# Patient Record
Sex: Female | Born: 1950 | Race: White | Hispanic: No | Smoking: Never smoker
Health system: Southern US, Community
[De-identification: ages and names within clinical notes are randomized; demographics above are authoritative.]

## PROBLEM LIST (undated history)

## (undated) DIAGNOSIS — T7840XA Allergy, unspecified, initial encounter: Secondary | ICD-10-CM

## (undated) DIAGNOSIS — G709 Myoneural disorder, unspecified: Secondary | ICD-10-CM

## (undated) DIAGNOSIS — B029 Zoster without complications: Secondary | ICD-10-CM

## (undated) DIAGNOSIS — E785 Hyperlipidemia, unspecified: Secondary | ICD-10-CM

## (undated) DIAGNOSIS — K219 Gastro-esophageal reflux disease without esophagitis: Secondary | ICD-10-CM

## (undated) DIAGNOSIS — F419 Anxiety disorder, unspecified: Secondary | ICD-10-CM

## (undated) DIAGNOSIS — I10 Essential (primary) hypertension: Secondary | ICD-10-CM

## (undated) DIAGNOSIS — M1990 Unspecified osteoarthritis, unspecified site: Secondary | ICD-10-CM

## (undated) DIAGNOSIS — IMO0002 Reserved for concepts with insufficient information to code with codable children: Secondary | ICD-10-CM

## (undated) DIAGNOSIS — M349 Systemic sclerosis, unspecified: Secondary | ICD-10-CM

## (undated) DIAGNOSIS — M810 Age-related osteoporosis without current pathological fracture: Secondary | ICD-10-CM

## (undated) HISTORY — DX: Essential (primary) hypertension: I10

## (undated) HISTORY — DX: Myoneural disorder, unspecified: G70.9

## (undated) HISTORY — DX: Hyperlipidemia, unspecified: E78.5

## (undated) HISTORY — DX: Anxiety disorder, unspecified: F41.9

## (undated) HISTORY — DX: Gastro-esophageal reflux disease without esophagitis: K21.9

## (undated) HISTORY — DX: Unspecified osteoarthritis, unspecified site: M19.90

## (undated) HISTORY — DX: Zoster without complications: B02.9

## (undated) HISTORY — PX: ESOPHAGEAL DILATION: SHX303

## (undated) HISTORY — PX: OOPHORECTOMY: SHX86

## (undated) HISTORY — PX: APPENDECTOMY: SHX54

## (undated) HISTORY — DX: Reserved for concepts with insufficient information to code with codable children: IMO0002

## (undated) HISTORY — DX: Allergy, unspecified, initial encounter: T78.40XA

## (undated) HISTORY — DX: Age-related osteoporosis without current pathological fracture: M81.0

## (undated) HISTORY — PX: EYE SURGERY: SHX253

---

## 1984-04-12 HISTORY — PX: ABDOMINAL HYSTERECTOMY: SHX81

## 2000-04-12 HISTORY — PX: OTHER SURGICAL HISTORY: SHX169

## 2003-04-13 HISTORY — PX: NASAL SINUS SURGERY: SHX719

## 2003-09-04 ENCOUNTER — Other Ambulatory Visit: Payer: Self-pay

## 2005-04-12 HISTORY — PX: OTHER SURGICAL HISTORY: SHX169

## 2007-02-16 ENCOUNTER — Ambulatory Visit: Payer: Self-pay | Admitting: Family Medicine

## 2007-06-22 ENCOUNTER — Ambulatory Visit: Payer: Self-pay | Admitting: Cardiology

## 2007-06-22 ENCOUNTER — Observation Stay: Payer: Self-pay | Admitting: Cardiology

## 2007-06-23 ENCOUNTER — Other Ambulatory Visit: Payer: Self-pay

## 2007-09-09 ENCOUNTER — Inpatient Hospital Stay: Payer: Self-pay | Admitting: Internal Medicine

## 2007-09-09 ENCOUNTER — Ambulatory Visit: Payer: Self-pay | Admitting: Cardiology

## 2007-09-09 ENCOUNTER — Other Ambulatory Visit: Payer: Self-pay

## 2008-08-16 DIAGNOSIS — R1310 Dysphagia, unspecified: Secondary | ICD-10-CM | POA: Insufficient documentation

## 2009-03-12 HISTORY — PX: CHOLECYSTECTOMY: SHX55

## 2009-04-15 ENCOUNTER — Ambulatory Visit: Payer: Self-pay | Admitting: Family Medicine

## 2009-04-18 DIAGNOSIS — M25519 Pain in unspecified shoulder: Secondary | ICD-10-CM | POA: Insufficient documentation

## 2012-07-13 LAB — HM COLONOSCOPY

## 2012-07-16 ENCOUNTER — Emergency Department: Payer: Self-pay | Admitting: Emergency Medicine

## 2012-07-16 LAB — COMPREHENSIVE METABOLIC PANEL
Albumin: 3.6 g/dL (ref 3.4–5.0)
Alkaline Phosphatase: 65 U/L (ref 50–136)
Anion Gap: 8 (ref 7–16)
BUN: 6 mg/dL — ABNORMAL LOW (ref 7–18)
Calcium, Total: 9 mg/dL (ref 8.5–10.1)
Chloride: 102 mmol/L (ref 98–107)
Co2: 28 mmol/L (ref 21–32)
Creatinine: 0.97 mg/dL (ref 0.60–1.30)
EGFR (African American): >60
Glucose: 93 mg/dL (ref 65–99)
Osmolality: 273 (ref 275–301)
SGPT (ALT): 18 U/L (ref 12–78)
Sodium: 138 mmol/L (ref 136–145)
Total Protein: 7.3 g/dL (ref 6.4–8.2)

## 2012-07-16 LAB — URINALYSIS, COMPLETE
Bacteria: NONE SEEN
Bilirubin,UR: NEGATIVE
Glucose,UR: NEGATIVE mg/dL (ref 0–75)
Ketone: NEGATIVE
Leukocyte Esterase: NEGATIVE
Nitrite: NEGATIVE
Ph: 9 (ref 4.5–8.0)
Protein: NEGATIVE
WBC UR: 1 /HPF (ref 0–5)

## 2012-07-16 LAB — CK TOTAL AND CKMB (NOT AT ARMC): CK-MB: <0.5 ng/mL — ABNORMAL LOW (ref 0.5–3.6)

## 2012-07-16 LAB — CBC
HCT: 31.4 % — ABNORMAL LOW (ref 35.0–47.0)
MCH: 23.1 pg — ABNORMAL LOW (ref 26.0–34.0)
MCV: 72 fL — ABNORMAL LOW (ref 80–100)
Platelet: 245 10*3/uL (ref 150–440)
RDW: 16.2 % — ABNORMAL HIGH (ref 11.5–14.5)
WBC: 8.6 10*3/uL (ref 3.6–11.0)

## 2012-07-16 LAB — TROPONIN I
Troponin-I: 0.02 ng/mL
Troponin-I: 0.02 ng/mL

## 2012-08-08 DIAGNOSIS — M349 Systemic sclerosis, unspecified: Secondary | ICD-10-CM | POA: Insufficient documentation

## 2013-04-15 DIAGNOSIS — K56609 Unspecified intestinal obstruction, unspecified as to partial versus complete obstruction: Secondary | ICD-10-CM

## 2013-04-15 DIAGNOSIS — Z8719 Personal history of other diseases of the digestive system: Secondary | ICD-10-CM | POA: Insufficient documentation

## 2013-05-23 ENCOUNTER — Ambulatory Visit: Payer: Self-pay | Admitting: Family Medicine

## 2013-06-18 DIAGNOSIS — IMO0002 Reserved for concepts with insufficient information to code with codable children: Secondary | ICD-10-CM | POA: Insufficient documentation

## 2014-07-15 LAB — BASIC METABOLIC PANEL
BUN: 6 mg/dL (ref 4–21)
Creatinine: 0.7 mg/dL (ref 0.5–1.1)
GLUCOSE: 84 mg/dL
POTASSIUM: 3.9 mmol/L (ref 3.4–5.3)
SODIUM: 145 mmol/L (ref 137–147)

## 2014-07-15 LAB — CBC AND DIFFERENTIAL
HEMATOCRIT: 43 % (ref 36–46)
Hemoglobin: 14 g/dL (ref 12.0–16.0)
PLATELETS: 248 10*3/uL (ref 150–399)
WBC: 9.1 10*3/mL

## 2014-07-15 LAB — LIPID PANEL
CHOLESTEROL: 199 mg/dL (ref 0–200)
HDL: 56 mg/dL (ref 35–70)
LDL CALC: 87 mg/dL
Triglycerides: 281 mg/dL — AB (ref 40–160)

## 2014-07-15 LAB — TSH: TSH: 3.86 u[IU]/mL (ref 0.41–5.90)

## 2014-07-15 LAB — HEPATIC FUNCTION PANEL
ALT: 25 U/L (ref 7–35)
AST: 27 U/L (ref 13–35)

## 2014-07-31 LAB — HM MAMMOGRAPHY

## 2014-12-18 ENCOUNTER — Telehealth: Payer: Self-pay | Admitting: Family Medicine

## 2014-12-18 DIAGNOSIS — B029 Zoster without complications: Secondary | ICD-10-CM | POA: Insufficient documentation

## 2014-12-18 HISTORY — DX: Zoster without complications: B02.9

## 2014-12-18 MED ORDER — VALACYCLOVIR HCL 1 G PO TABS: 1000.0000 mg | ORAL_TABLET | Freq: Three times a day (TID) | ORAL | Status: DC

## 2014-12-18 NOTE — Telephone Encounter (Signed)
Patient reports she has a rash on her right abdominal area. Patient denies itching and reports mild pain with touch. Patient reports she believes it shingles as rash is similar to the one she had before on her leg. Patient reports that she would like for you to send in medication to [facility] on [address] if possible with out seeing her. Patient reports that she can come in today around 4 or tomorrow morning. sd

## 2014-12-18 NOTE — Telephone Encounter (Signed)
Sent in rx. OV if does not improve or changes. Thanks.

## 2014-12-18 NOTE — Telephone Encounter (Signed)
Pt called saying she has a patch of bumps,  Itching a little on her left side (Abd area) Thinks it possible could be shingles,  She has had them before.  I Tried to triage but no one was available. Offered her a 2:45 appt today but she could not come in.  Can someone please call her back. [phone]  Thanks Con Memos

## 2014-12-18 NOTE — Telephone Encounter (Signed)
Pt advised.   Thanks,   -Shalena Ezzell  

## 2015-01-11 ENCOUNTER — Ambulatory Visit (INDEPENDENT_AMBULATORY_CARE_PROVIDER_SITE_OTHER): Payer: Medicare Other

## 2015-01-11 DIAGNOSIS — Z23 Encounter for immunization: Secondary | ICD-10-CM | POA: Diagnosis not present

## 2015-01-17 ENCOUNTER — Encounter: Payer: Self-pay | Admitting: Family Medicine

## 2015-05-19 ENCOUNTER — Other Ambulatory Visit: Payer: Self-pay

## 2015-05-19 DIAGNOSIS — M341 CR(E)ST syndrome: Secondary | ICD-10-CM | POA: Insufficient documentation

## 2015-05-19 DIAGNOSIS — I10 Essential (primary) hypertension: Secondary | ICD-10-CM

## 2015-05-19 DIAGNOSIS — E7800 Pure hypercholesterolemia, unspecified: Secondary | ICD-10-CM

## 2015-05-19 MED ORDER — ESCITALOPRAM OXALATE 10 MG PO TABS: 10.0000 mg | ORAL_TABLET | Freq: Every day | ORAL | Status: DC

## 2015-05-19 MED ORDER — SIMVASTATIN 10 MG PO TABS: 10.0000 mg | ORAL_TABLET | Freq: Every evening | ORAL | Status: DC

## 2015-05-19 MED ORDER — HYDROCHLOROTHIAZIDE 12.5 MG PO TABS: 12.5000 mg | ORAL_TABLET | Freq: Every day | ORAL | Status: DC

## 2015-07-15 HISTORY — PX: PYLOROPLASTY: SHX418

## 2015-07-22 ENCOUNTER — Encounter: Payer: Self-pay | Admitting: Family Medicine

## 2015-07-22 ENCOUNTER — Ambulatory Visit (INDEPENDENT_AMBULATORY_CARE_PROVIDER_SITE_OTHER): Payer: Medicare HMO | Admitting: Family Medicine

## 2015-07-22 VITALS — BP 108/62 | HR 74 | Temp 98.8°F | Resp 14 | Wt 115.0 lb

## 2015-07-22 DIAGNOSIS — E7800 Pure hypercholesterolemia, unspecified: Secondary | ICD-10-CM | POA: Diagnosis not present

## 2015-07-22 DIAGNOSIS — K5669 Other intestinal obstruction: Secondary | ICD-10-CM | POA: Diagnosis not present

## 2015-07-22 DIAGNOSIS — K56609 Unspecified intestinal obstruction, unspecified as to partial versus complete obstruction: Secondary | ICD-10-CM

## 2015-07-22 DIAGNOSIS — M341 CR(E)ST syndrome: Secondary | ICD-10-CM

## 2015-07-22 DIAGNOSIS — M349 Systemic sclerosis, unspecified: Secondary | ICD-10-CM | POA: Diagnosis not present

## 2015-07-22 DIAGNOSIS — R002 Palpitations: Secondary | ICD-10-CM | POA: Insufficient documentation

## 2015-07-22 DIAGNOSIS — R5383 Other fatigue: Secondary | ICD-10-CM | POA: Insufficient documentation

## 2015-07-22 DIAGNOSIS — F4320 Adjustment disorder, unspecified: Secondary | ICD-10-CM | POA: Insufficient documentation

## 2015-07-22 DIAGNOSIS — K219 Gastro-esophageal reflux disease without esophagitis: Secondary | ICD-10-CM | POA: Diagnosis not present

## 2015-07-22 DIAGNOSIS — I10 Essential (primary) hypertension: Secondary | ICD-10-CM | POA: Diagnosis not present

## 2015-07-22 DIAGNOSIS — K3184 Gastroparesis: Secondary | ICD-10-CM | POA: Diagnosis not present

## 2015-07-22 DIAGNOSIS — R109 Unspecified abdominal pain: Secondary | ICD-10-CM | POA: Insufficient documentation

## 2015-07-22 DIAGNOSIS — R0789 Other chest pain: Secondary | ICD-10-CM | POA: Insufficient documentation

## 2015-07-22 DIAGNOSIS — E46 Unspecified protein-calorie malnutrition: Secondary | ICD-10-CM | POA: Diagnosis not present

## 2015-07-22 MED ORDER — ESCITALOPRAM OXALATE 10 MG PO TABS
10.0000 mg | ORAL_TABLET | Freq: Every day | ORAL | Status: DC
Start: 2015-07-22 — End: 2016-08-10

## 2015-07-22 MED ORDER — SIMVASTATIN 10 MG PO TABS
10.0000 mg | ORAL_TABLET | Freq: Every evening | ORAL | Status: DC
Start: 2015-07-22 — End: 2016-07-22

## 2015-07-22 NOTE — Progress Notes (Addendum)
Patient ID: Sara Huynh, female   DOB: 05/02/1950, 65 y.o.   MRN: [id number]    Subjective:  HPI  Patient has been on disability since 2009 due to scleroderma with gastric paresis and atony. Patient has recent pyloroplasty on 07/15/15. Chronically malnutrition and weakness and not a candidate to go back to work. Has not even been well enough to visit her mom in the nursing home.   Patient needs to have disability forms filled out today. This use to be filled out by her gastroenteralogist at [facility] Dr. Roxy Manns but he retire and her new gastroenterelogist did not fill comfortable filling this out since he just met her. This has to be done once a year, patient thinks this will be her last year to have this filled out.    Also BP low today. Also would like her chronic medications filled. Due for Wellness in  April.   Prior to Admission medications   Medication Sig Start Date End Date Taking? Authorizing Provider  Cholecalciferol (VITAMIN D3) 1000 units CAPS Take by mouth. 09/16/10  Yes Historical Provider, MD  dicyclomine (BENTYL) 10 MG capsule Take 10 mg by mouth 4 (four) times daily.   Yes Historical Provider, MD  escitalopram (LEXAPRO) 10 MG tablet Take 1 tablet (10 mg total) by mouth daily. 05/19/15  Yes Margarita Rana, MD  hydrochlorothiazide (HYDRODIURIL) 12.5 MG tablet Take 1 tablet (12.5 mg total) by mouth daily. 05/19/15  Yes Margarita Rana, MD  Linaclotide [facility]) 145 MCG CAPS capsule Take 145 mcg by mouth daily.  04/17/15  Yes Historical Provider, MD  omeprazole-sodium bicarbonate (ZEGERID) 40-1100 MG capsule Take 1 capsule by mouth 2 (two) times daily.    Yes Historical Provider, MD  promethazine (PHENERGAN) 12.5 MG tablet Take 12.5 mg by mouth.   Yes Historical Provider, MD  simvastatin (ZOCOR) 10 MG tablet Take 1 tablet (10 mg total) by mouth every evening. 05/19/15  Yes Margarita Rana, MD  sucralfate (CARAFATE) 1 g tablet Take 1 g by mouth 4 (four) times daily.    Yes Historical Provider, MD    vitamin B-12 (CYANOCOBALAMIN) 100 MCG tablet Take by mouth.   Yes Historical Provider, MD  vitamin E 1000 UNIT capsule Take by mouth.   Yes Historical Provider, MD    Patient Active Problem List   Diagnosis Date Noted  . Abdominal pain 07/22/2015  . Adaptation reaction 07/22/2015  . Atypical chest pain 07/22/2015  . Fatigue 07/22/2015  . Awareness of heartbeats 07/22/2015  . 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 Shingles 12/18/2014  . 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 Small bowel obstruction ([place]) 04/15/2013  . Systemic sclerosis ([place]) 08/08/2012  . Avitaminosis D 10/30/2009  . Pain in shoulder 04/18/2009  . Hypercholesteremia 08/23/2008  . Abnormal ECG 08/23/2008  . H/O total hysterectomy 08/21/2008  . Can't get food down 08/16/2008  . Hypertriglyceridemia 08/16/2008  . BP (high blood pressure) 08/16/2008  . 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 Acid reflux 08/16/2008  . Scleroderma ([place]) 04/12/1998    Past Medical History  Diagnosis Date  . Hyperlipidemia   . GERD (gastroesophageal reflux disease)   . Anxiety   . Hypertension     Social History   Social History  . Marital Status: Married    Spouse Name: N/A  . Number of Children: N/A  . Years of Education: N/A   Occupational History  . Not on file.   Social History Main Topics  . Smoking status: Never  Smoker   . Smokeless tobacco: Never Used  . Alcohol Use: No  . Drug Use: No  . Sexual Activity: Not on file   Other Topics Concern  . Not on file   Social History Narrative    Allergies  Allergen Reactions  . Prednisone Itching    Other reaction(s): Other (See Comments) Makes skin crawl and make pt aggitated    Review of Systems  Constitutional: Positive for fever (low grade in the afternoon since recent surgery), weight loss and malaise/fatigue. Negative for chills.  Eyes: Negative for blurred vision.  Respiratory: Negative.    Cardiovascular: Negative.   Gastrointestinal: Positive for heartburn, nausea, vomiting, abdominal pain (bloated sensation) and constipation. Negative for diarrhea.  Musculoskeletal: Negative.   Neurological: Positive for weakness. Negative for dizziness and tingling.    Immunization History  Administered Date(s) Administered  . Influenza,inj,Quad PF,36+ Mos 01/11/2015  . Td 10/05/2010  . Tdap 10/05/2010   Objective:  BP 108/62 mmHg  Pulse 74  Temp(Src) 98.8 F (37.1 C)  Resp 14  Wt 115 lb (52.164 kg)  Physical Exam  Constitutional: She is oriented to person, place, and time and well-developed, well-nourished, and in no distress.  Neurological: She is alert and oriented to person, place, and time.  Skin:  Multiple well-healing trochar scars.   Psychiatric: Mood, memory, affect and judgment normal.    Lab Results  Component Value Date   WBC 9.1 07/15/2014   HGB 14.0 07/15/2014   HCT 43 07/15/2014   PLT 248 07/15/2014   GLUCOSE 93 07/16/2012   CHOL 199 07/15/2014   TRIG 281* 07/15/2014   HDL 56 07/15/2014   LDLCALC 87 07/15/2014   TSH 3.86 07/15/2014    CMP     Component Value Date/Time   NA 145 07/15/2014   NA 138 07/16/2012 1325   K 3.9 07/15/2014   K 3.2* 07/16/2012 1325   CL 102 07/16/2012 1325   CO2 28 07/16/2012 1325   GLUCOSE 93 07/16/2012 1325   BUN 6 07/15/2014   BUN 6* 07/16/2012 1325   CREATININE 0.7 07/15/2014   CREATININE 0.97 07/16/2012 1325   CALCIUM 9.0 07/16/2012 1325   PROT 7.3 07/16/2012 1325   ALBUMIN 3.6 07/16/2012 1325   AST 27 07/15/2014   AST 20 07/16/2012 1325   ALT 25 07/15/2014   ALT 18 07/16/2012 1325   ALKPHOS 65 07/16/2012 1325   BILITOT 0.5 07/16/2012 1325   GFRNONAA >60 07/16/2012 1325   GFRAA >60 07/16/2012 1325    Assessment and Plan :  1. Gastric atony Filled out disability forms for patient. This was previously done by gastroenterelogist at [facility] who has retired now. She is following a new GI doctor in [place] now for her chronic issues listed. Filled out forms for year.    2. 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 Followed by GI.   3. 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 Current causing a lot of problems with digestion.  Chronically malnourished.  On pureed diet  After surgery. Gets a lot of her calories from Ensure.   4. Small bowel obstruction ([place])  5. Gastroesophageal reflux disease, esophagitis presence not specified Stable.  6. Hypertension Will stop HCTZ for now and see if B/P still controlled off the medication. Advised to follow up on this when patient follow up with Dr. Caryn Section for her physical.  7. Malnutrition related to chronic disease ([place]) As above. Continue to eat pureed diet and Ensure as  tolerated.     8. Hypercholesteremia Condition is stable. Please continue current medication and  plan of care as noted.   - simvastatin (ZOCOR) 10 MG tablet; Take 1 tablet (10 mg total) by mouth every evening.  Dispense: 90 tablet; Refill: 3  Patient was seen and examined by Dr. Margarita Rana and note was scribed by Theressa Millard, RMA.  I have reviewed the document for accuracy and completeness and I agree with above. - Jerrell Belfast, MD   Margarita Rana, [facility] 07/22/2015 2:46 PM

## 2015-07-30 DIAGNOSIS — L0291 Cutaneous abscess, unspecified: Secondary | ICD-10-CM | POA: Insufficient documentation

## 2015-08-21 ENCOUNTER — Encounter: Payer: Self-pay | Admitting: Family Medicine

## 2015-08-21 ENCOUNTER — Ambulatory Visit (INDEPENDENT_AMBULATORY_CARE_PROVIDER_SITE_OTHER): Payer: Medicare HMO | Admitting: Family Medicine

## 2015-08-21 VITALS — BP 128/78 | HR 68 | Temp 98.2°F | Resp 16 | Ht 60.0 in | Wt 115.0 lb

## 2015-08-21 DIAGNOSIS — E7800 Pure hypercholesterolemia, unspecified: Secondary | ICD-10-CM | POA: Diagnosis not present

## 2015-08-21 DIAGNOSIS — Z0000 Encounter for general adult medical examination without abnormal findings: Secondary | ICD-10-CM | POA: Diagnosis not present

## 2015-08-21 DIAGNOSIS — M349 Systemic sclerosis, unspecified: Secondary | ICD-10-CM

## 2015-08-21 DIAGNOSIS — I10 Essential (primary) hypertension: Secondary | ICD-10-CM | POA: Diagnosis not present

## 2015-08-21 DIAGNOSIS — E559 Vitamin D deficiency, unspecified: Secondary | ICD-10-CM

## 2015-08-21 DIAGNOSIS — M341 CR(E)ST syndrome: Secondary | ICD-10-CM

## 2015-08-21 NOTE — Progress Notes (Signed)
Patient ID: Sara Huynh, female   DOB: 1951/01/09, 65 y.o.   MRN: [id number]       Patient: Sara Huynh, Female    DOB: 1951/02/25, 65 y.o.   MRN: [id number] Visit Date: 08/21/2015  Today's Provider: Lelon Huh, MD   Chief Complaint  Patient presents with  . Annual Exam   Subjective:    Annual wellness visit Sara Huynh is a 64 y.o. female. She feels well. She reports exercising daily. She reports she is sleeping well.  -----------------------------------------------------------  Hypertension, follow-up:  BP Readings from Last 3 Encounters:  08/21/15 128/78  07/22/15 108/62  07/15/14 126/70    She was last seen for hypertension 4 weeks ago.  BP at that visit was 108/62. Management changes since that visit include D/C HCTZ. She reports excellent compliance with treatment. She is not having side effects.  She is exercising. She is adherent to low salt diet.   Outside blood pressures are stable. She is experiencing none.  Patient denies chest pain.   Cardiovascular risk factors include none.  Use of agents associated with hypertension: none.     Weight trend: stable Wt Readings from Last 3 Encounters:  08/21/15 115 lb (52.164 kg)  07/22/15 115 lb (52.164 kg)  07/15/14 113 lb (51.256 kg)    Current diet: in general, a "healthy" diet    ------------------------------------------------------------------------     Review of Systems  Constitutional: Negative.   HENT: Negative.   Eyes: Negative.   Respiratory: Negative.   Cardiovascular: Negative.   Gastrointestinal: Positive for nausea, vomiting, constipation and abdominal distention.  Endocrine: Positive for cold intolerance.  Genitourinary: Negative.   Musculoskeletal: Positive for back pain and arthralgias.  Skin: Negative.   Allergic/Immunologic: Negative.   Neurological: Negative.   Hematological: Negative.   Psychiatric/Behavioral: Negative.     Social History   Social History    . Marital Status: Married    Spouse Name: N/A  . Number of Children: N/A  . Years of Education: N/A   Occupational History  . Not on file.   Social History Main Topics  . Smoking status: Never Smoker   . Smokeless tobacco: Never Used  . Alcohol Use: No  . Drug Use: No  . Sexual Activity: Not on file   Other Topics Concern  . Not on file   Social History Narrative    Past Medical History  Diagnosis Date  . Hyperlipidemia   . GERD (gastroesophageal reflux disease)   . Anxiety   . Hypertension      Patient Active Problem List   Diagnosis Date Noted  . Cutaneous abscess 07/30/2015  . Abdominal pain 07/22/2015  . Adaptation reaction 07/22/2015  . Atypical chest pain 07/22/2015  . Fatigue 07/22/2015  . Awareness of heartbeats 07/22/2015  . 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 Shingles 12/18/2014  . 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 Small bowel obstruction ([place]) 04/15/2013  . Systemic sclerosis ([place]) 08/08/2012  . Avitaminosis D 10/30/2009  . Pain in shoulder 04/18/2009  . Hypercholesteremia 08/23/2008  . Abnormal ECG 08/23/2008  . H/O total hysterectomy 08/21/2008  . Can't get food down 08/16/2008  . Hypertriglyceridemia 08/16/2008  . BP (high blood pressure) 08/16/2008  . 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 Acid reflux 08/16/2008  . Scleroderma ([place]) 04/12/1998    Past Surgical History  Procedure Laterality Date  . Gi pacemaker  2002  . G tube placed for 18 mths   2007  .  Abdominal hysterectomy  1986  . Cholecystectomy  03/2009  . Nasal sinus surgery  2005  . Appendectomy    . Pyloroplasty  07/15/15    Her family history includes Alzheimer's disease in her mother; CAD in her mother; COPD in her father; CVA in her father; Dementia in her mother; Emphysema in her father; Heart disease in her mother.    Previous Medications   CHOLECALCIFEROL (VITAMIN D3) 1000 UNITS CAPS    Take 1  capsule by mouth daily.    DICYCLOMINE (BENTYL) 10 MG CAPSULE    Take 10 mg by mouth 4 (four) times daily.   ESCITALOPRAM (LEXAPRO) 10 MG TABLET    Take 1 tablet (10 mg total) by mouth daily.   LINACLOTIDE (LINZESS) 145 MCG CAPS CAPSULE    Take 145 mcg by mouth daily.    OMEPRAZOLE-SODIUM BICARBONATE (ZEGERID) 40-1100 MG CAPSULE    Take 1 capsule by mouth 2 (two) times daily.    ONDANSETRON (ZOFRAN) 8 MG TABLET    Take 1 tablet by mouth as needed.   SIMVASTATIN (ZOCOR) 10 MG TABLET    Take 1 tablet (10 mg total) by mouth every evening.   SUCRALFATE (CARAFATE) 1 G TABLET    Take 1 g by mouth 4 (four) times daily.    VITAMIN B-12 (CYANOCOBALAMIN) 100 MCG TABLET    Take 100 mcg by mouth daily.    VITAMIN E 1000 UNIT CAPSULE    Take 1,000 Units by mouth daily.     Patient Care Team: Birdie Sons, MD as PCP - General (Family Medicine) Margarette Canada, MD as Referring Physician (General Surgery)     Objective:   Vitals: BP 128/78 mmHg  Pulse 68  Temp(Src) 98.2 F (36.8 C) (Oral)  Resp 16  Ht 5' (1.524 m)  Wt 115 lb (52.164 kg)  BMI 22.46 kg/m2  Physical Exam  Constitutional: She is oriented to person, place, and time. She appears well-developed and well-nourished.  HENT:  Head: Normocephalic and atraumatic.  Right Ear: Tympanic membrane, external ear and ear canal normal.  Left Ear: Tympanic membrane, external ear and ear canal normal.  Nose: Nose normal.  Mouth/Throat: Uvula is midline, oropharynx is clear and moist and mucous membranes are normal.  Eyes: Conjunctivae, EOM and lids are normal. Pupils are equal, round, and reactive to light.  Neck: Trachea normal and normal range of motion. Neck supple. Carotid bruit is not present. No thyroid mass and no thyromegaly present.  Cardiovascular: Normal rate, regular rhythm and normal heart sounds.   Pulmonary/Chest: Effort normal and breath sounds normal.  Abdominal: Soft. Normal appearance and bowel sounds are normal. There is  no hepatosplenomegaly. There is no tenderness.  Genitourinary: No breast swelling, tenderness or discharge.  Musculoskeletal: Normal range of motion.  Lymphadenopathy:    She has no cervical adenopathy.    She has no axillary adenopathy.  Neurological: She is alert and oriented to person, place, and time. She has normal strength. No cranial nerve deficit.  Skin: Skin is warm, dry and intact.  Psychiatric: She has a normal mood and affect. Her speech is normal and behavior is normal. Judgment and thought content normal. Cognition and memory are normal.    Activities of Daily Living In your present state of health, do you have any difficulty performing the following activities: 08/21/2015 07/22/2015  Hearing? N N  Vision? N N  Difficulty concentrating or making decisions? N N  Walking or climbing stairs? N N  Dressing or  bathing? N N  Doing errands, shopping? N N    Fall Risk Assessment Fall Risk  08/21/2015  Falls in the past year? No     Depression Screen PHQ 2/9 Scores 08/21/2015  PHQ - 2 Score 0    Cognitive Testing - 6-CIT  Correct? Score   What year is it? yes 0 0 or 4  What month is it? yes 0 0 or 3  Memorize:    Sara Huynh,  42,  [address]  [place],      What time is it? (within 1 hour) yes 0 0 or 3  Count backwards from 20 yes 0 0, 2, or 4  Name the months of the year yes 0 0, 2, or 4  Repeat name & address above no 2 0, 2, 4, 6, 8, or 10       TOTAL SCORE  2/28   Interpretation:  Normal  Normal (0-7) Abnormal (8-28)       Assessment & Plan:    Annual physical Reviewed patient's Family Medical History Reviewed and updated list of patient's medical providers Assessment of cognitive impairment was done Assessed patient's functional ability Established a written schedule for health screening [place] Completed and Reviewed  Exercise Activities and Dietary recommendations Goals    None      Immunization History  Administered  Date(s) Administered  . Influenza,inj,Quad PF,36+ Mos 01/11/2015  . Td 10/05/2010  . Tdap 10/05/2010       ------------------------------------------------------------------------------------------------------------  1. Annual physical exam Generally doing well  2. Calcinosis, Raynaud phenomenon, esophageal dysfunction, sclerodactyly, and telangiectasia (HCC)  - Comprehensive metabolic panel  3. Avitaminosis D  - VITAMIN D 25 Hydroxy (Vit-D Deficiency, Fractures)  4. Hypercholesteremia She is tolerating simvastatin well with no adverse effects.   - Lipid panel - Comprehensive metabolic panel  5. Scleroderma (HCC)  - Comprehensive metabolic panel  6. Essential hypertension Well controlled off of hctz.  - EKG 12-Lead  The entirety of the information documented in the History of Present Illness, Review of Systems and Physical Exam were personally obtained by me. Portions of this information were initially documented by Lynford Humphrey, CMA  and reviewed by me for thoroughness and accuracy.    Lelon Huh, MD

## 2015-08-22 LAB — COMPREHENSIVE METABOLIC PANEL
A/G RATIO: 1.7 (ref 1.2–2.2)
ALBUMIN: 4.5 g/dL (ref 3.6–4.8)
ALT: 21 IU/L (ref 0–32)
AST: 25 IU/L (ref 0–40)
Alkaline Phosphatase: 44 IU/L (ref 39–117)
BUN / CREAT RATIO: 11 — AB (ref 12–28)
BUN: 9 mg/dL (ref 8–27)
Bilirubin Total: 0.6 mg/dL (ref 0.0–1.2)
CALCIUM: 9.9 mg/dL (ref 8.7–10.3)
CO2: 29 mmol/L (ref 18–29)
Chloride: 100 mmol/L (ref 96–106)
Creatinine, Ser: 0.8 mg/dL (ref 0.57–1.00)
GFR, EST AFRICAN AMERICAN: 90 mL/min/{1.73_m2} (ref >59)
GFR, EST NON AFRICAN AMERICAN: 78 mL/min/{1.73_m2} (ref >59)
GLOBULIN, TOTAL: 2.6 g/dL (ref 1.5–4.5)
Glucose: 85 mg/dL (ref 65–99)
POTASSIUM: 4.5 mmol/L (ref 3.5–5.2)
SODIUM: 144 mmol/L (ref 134–144)
TOTAL PROTEIN: 7.1 g/dL (ref 6.0–8.5)

## 2015-08-22 LAB — LIPID PANEL
CHOLESTEROL TOTAL: 229 mg/dL — AB (ref 100–199)
Chol/HDL Ratio: 3.7 ratio units (ref 0.0–4.4)
HDL: 62 mg/dL (ref >39)
LDL Calculated: 129 mg/dL — ABNORMAL HIGH (ref 0–99)
Triglycerides: 191 mg/dL — ABNORMAL HIGH (ref 0–149)
VLDL Cholesterol Cal: 38 mg/dL (ref 5–40)

## 2015-08-22 LAB — VITAMIN D 25 HYDROXY (VIT D DEFICIENCY, FRACTURES): Vit D, 25-Hydroxy: 36 ng/mL (ref 30.0–100.0)

## 2015-12-04 DIAGNOSIS — Z8719 Personal history of other diseases of the digestive system: Secondary | ICD-10-CM | POA: Diagnosis not present

## 2015-12-04 DIAGNOSIS — K219 Gastro-esophageal reflux disease without esophagitis: Secondary | ICD-10-CM | POA: Diagnosis not present

## 2015-12-04 DIAGNOSIS — K269 Duodenal ulcer, unspecified as acute or chronic, without hemorrhage or perforation: Secondary | ICD-10-CM | POA: Diagnosis not present

## 2015-12-04 DIAGNOSIS — Z79899 Other long term (current) drug therapy: Secondary | ICD-10-CM | POA: Diagnosis not present

## 2015-12-04 DIAGNOSIS — Z86010 Personal history of colonic polyps: Secondary | ICD-10-CM | POA: Diagnosis not present

## 2015-12-04 DIAGNOSIS — Z888 Allergy status to other drugs, medicaments and biological substances status: Secondary | ICD-10-CM | POA: Diagnosis not present

## 2015-12-04 DIAGNOSIS — I10 Essential (primary) hypertension: Secondary | ICD-10-CM | POA: Diagnosis not present

## 2015-12-04 DIAGNOSIS — K589 Irritable bowel syndrome without diarrhea: Secondary | ICD-10-CM | POA: Diagnosis not present

## 2015-12-04 DIAGNOSIS — Z9889 Other specified postprocedural states: Secondary | ICD-10-CM | POA: Diagnosis not present

## 2015-12-04 DIAGNOSIS — R1013 Epigastric pain: Secondary | ICD-10-CM | POA: Diagnosis not present

## 2016-01-03 ENCOUNTER — Ambulatory Visit (INDEPENDENT_AMBULATORY_CARE_PROVIDER_SITE_OTHER): Payer: Medicare Other

## 2016-01-03 DIAGNOSIS — Z23 Encounter for immunization: Secondary | ICD-10-CM | POA: Diagnosis not present

## 2016-01-03 NOTE — Addendum Note (Signed)
Addended by: Althea Charon D on: 01/03/2016 10:23 AM   Modules accepted: Orders

## 2016-02-05 DIAGNOSIS — R1013 Epigastric pain: Secondary | ICD-10-CM | POA: Diagnosis not present

## 2016-02-05 DIAGNOSIS — I10 Essential (primary) hypertension: Secondary | ICD-10-CM | POA: Diagnosis not present

## 2016-06-29 ENCOUNTER — Ambulatory Visit (INDEPENDENT_AMBULATORY_CARE_PROVIDER_SITE_OTHER): Payer: Medicare Other | Admitting: Family Medicine

## 2016-06-29 ENCOUNTER — Encounter: Payer: Self-pay | Admitting: Emergency Medicine

## 2016-06-29 ENCOUNTER — Emergency Department: Payer: Medicare Other

## 2016-06-29 ENCOUNTER — Emergency Department
Admission: EM | Admit: 2016-06-29 | Discharge: 2016-06-29 | Disposition: A | Discharge status: Home / Self Care | Payer: Medicare Other | Attending: Emergency Medicine | Admitting: Emergency Medicine

## 2016-06-29 ENCOUNTER — Encounter: Payer: Self-pay | Admitting: Family Medicine

## 2016-06-29 VITALS — BP 200/140 | HR 80 | Temp 98.9°F | Resp 16 | Wt 122.0 lb

## 2016-06-29 DIAGNOSIS — R51 Headache: Secondary | ICD-10-CM | POA: Diagnosis not present

## 2016-06-29 DIAGNOSIS — Z79899 Other long term (current) drug therapy: Secondary | ICD-10-CM | POA: Insufficient documentation

## 2016-06-29 DIAGNOSIS — I10 Essential (primary) hypertension: Secondary | ICD-10-CM | POA: Diagnosis not present

## 2016-06-29 DIAGNOSIS — R519 Headache, unspecified: Secondary | ICD-10-CM

## 2016-06-29 DIAGNOSIS — I160 Hypertensive urgency: Secondary | ICD-10-CM | POA: Diagnosis not present

## 2016-06-29 DIAGNOSIS — R079 Chest pain, unspecified: Secondary | ICD-10-CM | POA: Diagnosis not present

## 2016-06-29 HISTORY — DX: Systemic sclerosis, unspecified: M34.9

## 2016-06-29 LAB — CBC WITH DIFFERENTIAL/PLATELET
Basophils Absolute: 0.1 10*3/uL (ref 0–0.1)
Basophils Relative: 1 %
EOS ABS: 0.2 10*3/uL (ref 0–0.7)
Eosinophils Relative: 2 %
HEMATOCRIT: 38.9 % (ref 35.0–47.0)
HEMOGLOBIN: 13.1 g/dL (ref 12.0–16.0)
LYMPHS ABS: 2.1 10*3/uL (ref 1.0–3.6)
Lymphocytes Relative: 23 %
MCH: 28.8 pg (ref 26.0–34.0)
MCHC: 33.7 g/dL (ref 32.0–36.0)
MCV: 85.6 fL (ref 80.0–100.0)
MONOS PCT: 9 %
Monocytes Absolute: 0.8 10*3/uL (ref 0.2–0.9)
NEUTROS PCT: 65 %
Neutro Abs: 6.1 10*3/uL (ref 1.4–6.5)
Platelets: 225 10*3/uL (ref 150–440)
RBC: 4.55 MIL/uL (ref 3.80–5.20)
RDW: 14.6 % — ABNORMAL HIGH (ref 11.5–14.5)
WBC: 9.2 10*3/uL (ref 3.6–11.0)

## 2016-06-29 LAB — COMPREHENSIVE METABOLIC PANEL
ALT: 34 U/L (ref 14–54)
AST: 52 U/L — ABNORMAL HIGH (ref 15–41)
Albumin: 4.6 g/dL (ref 3.5–5.0)
Alkaline Phosphatase: 55 U/L (ref 38–126)
Anion gap: 9 (ref 5–15)
BILIRUBIN TOTAL: 0.8 mg/dL (ref 0.3–1.2)
BUN: 10 mg/dL (ref 6–20)
CO2: 29 mmol/L (ref 22–32)
Calcium: 9.9 mg/dL (ref 8.9–10.3)
Chloride: 100 mmol/L — ABNORMAL LOW (ref 101–111)
Creatinine, Ser: 0.92 mg/dL (ref 0.44–1.00)
Glucose, Bld: 80 mg/dL (ref 65–99)
POTASSIUM: 3.9 mmol/L (ref 3.5–5.1)
Sodium: 138 mmol/L (ref 135–145)
Total Protein: 8 g/dL (ref 6.5–8.1)

## 2016-06-29 LAB — TROPONIN I: Troponin I: <0.03 ng/mL (ref <0.03)

## 2016-06-29 MED ORDER — HYDROCHLOROTHIAZIDE 25 MG PO TABS
25.0000 mg | ORAL_TABLET | Freq: Every day | ORAL | Status: DC
  Administered 2016-06-29: 25 mg via ORAL
  Filled 2016-06-29: qty 1

## 2016-06-29 MED ORDER — LISINOPRIL 10 MG PO TABS
10.0000 mg | ORAL_TABLET | Freq: Once | ORAL | Status: AC
  Administered 2016-06-29: 10 mg via ORAL
  Filled 2016-06-29: qty 1

## 2016-06-29 MED ORDER — DIPHENHYDRAMINE HCL 50 MG/ML IJ SOLN
25.0000 mg | Freq: Once | INTRAMUSCULAR | Status: AC
  Administered 2016-06-29: 25 mg via INTRAVENOUS
  Filled 2016-06-29: qty 1

## 2016-06-29 MED ORDER — HYDROCHLOROTHIAZIDE 25 MG PO TABS: 25.0000 mg | ORAL_TABLET | Freq: Every day | ORAL | 0 refills | Status: DC

## 2016-06-29 MED ORDER — METOCLOPRAMIDE HCL 5 MG/ML IJ SOLN
10.0000 mg | Freq: Once | INTRAMUSCULAR | Status: AC
  Administered 2016-06-29: 10 mg via INTRAVENOUS
  Filled 2016-06-29: qty 2

## 2016-06-29 MED ORDER — LISINOPRIL 10 MG PO TABS: 10.0000 mg | ORAL_TABLET | Freq: Every day | ORAL | 0 refills | Status: DC

## 2016-06-29 NOTE — ED Notes (Signed)
Total of 4 IV attempts unsuccessful at this point. After pt rests we will re attempt with other personal.

## 2016-06-29 NOTE — ED Provider Notes (Signed)
[facility] Provider Note   CSN: [id number] Arrival date & time: 06/29/16  1045     History   Chief Complaint Chief Complaint  Patient presents with  . Hypertension  . Chest Pain    HPI Sara Huynh is a 66 y.o. female hx of GERD, HL, HTN, Here presenting with headaches, chest pain. Patient has been having diffuse headaches for the last week or so. For the last 3 days she has intermittent chest pain and back pain. She checked her blood pressure yesterday and was 190/90. She states that she was on hydrocodone as I previously but has not been on it for years. She went to see her primary care doctor was sent here for hypertensive urgency. She states that she is urinating well and denies any abdominal pain.  The history is provided by the patient.    Past Medical History:  Diagnosis Date  . Anxiety   . GERD (gastroesophageal reflux disease)   . Hyperlipidemia   . Hypertension   . Scleroderma [facility])     Patient Active Problem List   Diagnosis Date Noted  . Cutaneous abscess 07/30/2015  . Abdominal pain 07/22/2015  . Adaptation reaction 07/22/2015  . Atypical chest pain 07/22/2015  . Fatigue 07/22/2015  . Awareness of heartbeats 07/22/2015  . 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 Shingles 12/18/2014  . 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 Small bowel obstruction 04/15/2013  . Systemic sclerosis ([place]) 08/08/2012  . Avitaminosis D 10/30/2009  . Pain in shoulder 04/18/2009  . Hypercholesteremia 08/23/2008  . Abnormal ECG 08/23/2008  . H/O total hysterectomy 08/21/2008  . Can't get food down 08/16/2008  . BP (high blood pressure) 08/16/2008  . 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 Acid reflux 08/16/2008  . Scleroderma ([place]) 04/12/1998    Past Surgical History:  Procedure Laterality Date  . ABDOMINAL HYSTERECTOMY  1986  . APPENDECTOMY    . CHOLECYSTECTOMY  03/2009  . G tube placed for  18 mths   2007  . gi pacemaker  2002  . NASAL SINUS SURGERY  2005  . PYLOROPLASTY  07/15/15    OB History    No data available       Home Medications    Prior to Admission medications   Medication Sig Start Date End Date Taking? Authorizing Provider  Cholecalciferol (VITAMIN D3) 1000 units CAPS Take 1 capsule by mouth daily.  09/16/10  Yes Historical Provider, MD  escitalopram (LEXAPRO) 10 MG tablet Take 1 tablet (10 mg total) by mouth daily. 07/22/15  Yes Margarita Rana, MD  linaclotide [facility]) 145 MCG CAPS capsule Take 145 mcg by mouth daily.  04/17/15  Yes Historical Provider, MD  Multiple Vitamins-Minerals (MULTIVITAMIN ADULT PO) Take 1 tablet by mouth daily.   Yes Historical Provider, MD  omeprazole-sodium bicarbonate (ZEGERID) 40-1100 MG capsule Take 1 capsule by mouth 3 (three) times daily with meals.    Yes Historical Provider, MD  ondansetron (ZOFRAN) 8 MG tablet Take 1 tablet by mouth as needed.   Yes Historical Provider, MD  simvastatin (ZOCOR) 10 MG tablet Take 1 tablet (10 mg total) by mouth every evening. 07/22/15  Yes Margarita Rana, MD  sucralfate (CARAFATE) 1 g tablet Take 1 g by mouth 4 (four) times daily.    Yes Historical Provider, MD  vitamin B-12 (CYANOCOBALAMIN) 1000 MCG tablet Take 1,000 mcg by mouth daily.    Yes Historical Provider, MD    Family History Family History  Problem  Relation Age of Onset  . CAD Mother   . Heart disease Mother     MI  . Dementia Mother   . Alzheimer's disease Mother   . CVA Father   . COPD Father   . Emphysema Father     Social History Social History  Substance Use Topics  . Smoking status: Never Smoker  . Smokeless tobacco: Never Used  . Alcohol use No     Allergies   Prednisone   Review of Systems Review of Systems  Cardiovascular: Positive for chest pain.  Neurological: Positive for headaches.  All other systems reviewed and are negative.    Physical Exam Updated Vital Signs BP (!) 154/99   Pulse (!) 59    Temp 98.2 F (36.8 C) (Oral)   Resp 14   Ht 5' (1.524 m)   Wt 122 lb (55.3 kg)   SpO2 100%   BMI 23.83 kg/m   Physical Exam  Constitutional: She is oriented to person, place, and time.  Slightly uncomfortable   HENT:  Head: Normocephalic.  Mouth/Throat: Oropharynx is clear and moist.  Eyes: EOM are normal. Pupils are equal, round, and reactive to light.  Neck: Normal range of motion. Neck supple.  Cardiovascular: Normal rate, regular rhythm and normal heart sounds.   Pulmonary/Chest: Effort normal and breath sounds normal. No respiratory distress. She has no wheezes. She has no rales.  Abdominal: Soft. Bowel sounds are normal. She exhibits no distension. There is no tenderness.  Musculoskeletal: Normal range of motion.  Neurological: She is alert and oriented to person, place, and time. No cranial nerve deficit. Coordination normal.  CN 2-12 intact. Nl strength throughout. Nl sensation throughout. Nl finger to nose   Skin: Skin is warm.  Psychiatric: She has a normal mood and affect.  Nursing note and vitals reviewed.    ED Treatments / Results  Labs (all labs ordered are listed, but only abnormal results are displayed) Labs Reviewed  CBC WITH DIFFERENTIAL/PLATELET - Abnormal; Notable for the following:       Result Value   RDW 14.6 (*)    All other components within normal limits  COMPREHENSIVE METABOLIC PANEL - Abnormal; Notable for the following:    Chloride 100 (*)    AST 52 (*)    All other components within normal limits  TROPONIN I  TROPONIN I    EKG  EKG Interpretation None       ED ECG REPORT I, Wandra Arthurs, the attending physician, personally viewed and interpreted this ECG.   Date: 06/29/2016  EKG Time: 10: 50 am  Rate: 67  Rhythm: normal EKG, normal sinus rhythm  Axis: normal  Intervals:none  ST&T Change: normal   Radiology Dg Chest 2 View  Result Date: 06/29/2016 CLINICAL DATA:  Hypertension. EXAM: CHEST  2 VIEW COMPARISON:  07/16/2012 .  FINDINGS: Mediastinum and hilar structures normal. Lungs are clear. No pleural effusion or pneumothorax. Cardiomegaly with normal pulmonary vascularity. Degenerative changes thoracic spine. Surgical staples and wires noted over the upper abdomen . IMPRESSION: No acute cardiopulmonary disease. Electronically Signed   By: Marcello Moores  Register   On: 06/29/2016 11:23   Ct Head Wo Contrast  Result Date: 06/29/2016 CLINICAL DATA:  Headache, hypertension. EXAM: CT HEAD WITHOUT CONTRAST TECHNIQUE: Contiguous axial images were obtained from the base of the skull through the vertex without intravenous contrast. COMPARISON:  None. FINDINGS: Brain: No acute intracranial abnormality. Specifically, no hemorrhage, hydrocephalus, mass lesion, acute infarction, or significant intracranial injury. Vascular:  No hyperdense vessel or unexpected calcification. Skull: No acute calvarial abnormality. Sinuses/Orbits: Postoperative changes in the paranasal sinuses. Mastoid air cells and orbital soft tissues unremarkable. Other: None IMPRESSION: No acute intracranial abnormality. Electronically Signed   By: Rolm Baptise M.D.   On: 06/29/2016 11:30    Procedures Procedures (including critical care time)  Medications Ordered in ED Medications  hydrochlorothiazide (HYDRODIURIL) tablet 25 mg (25 mg Oral Given 06/29/16 1155)  metoCLOPramide (REGLAN) injection 10 mg (10 mg Intravenous Given 06/29/16 1231)  diphenhydrAMINE (BENADRYL) injection 25 mg (25 mg Intravenous Given 06/29/16 1222)  lisinopril (PRINIVIL,ZESTRIL) tablet 10 mg (10 mg Oral Given 06/29/16 1155)     Initial Impression / Assessment and Plan / ED Course  I have reviewed the triage vital signs and the nursing notes.  Pertinent labs & imaging results that were available during my care of the patient were reviewed by me and considered in my medical decision making (see chart for details).     Sara Huynh is a 66 y.o. female here with headaches, chest pain,  hypertension. Likely symptomatic hypertension. Nl neuro exam. Low suspicion for dissection. Will get labs, CT head, CXR, trop x 2. Will start HCTZ, lisinopril and give migraine cocktail.   2:46 PM BP down to 154/99 from 200/100 on arrival. Delta trop neg. CT head and CXR unremarkable. Headache improved. Likely symptomatic hypertension. Will dc home with hctz 25 mg and lisinopril 10 mg daily. Will have her see PCP next week to recheck BP.   Final Clinical Impressions(s) / ED Diagnoses   Final diagnoses:  None    New Prescriptions New Prescriptions   No medications on file     Drenda Freeze, MD 06/29/16 1447

## 2016-06-29 NOTE — ED Triage Notes (Signed)
Pt to ed with c/o HTN and CP, and headache x 1 week.  Pt sent from Dr Maralyn Sago office for HTN.

## 2016-06-29 NOTE — Discharge Instructions (Signed)
Take lisinopril 10 mg daily and HCTZ 25 mg daily  See your doctor next week to recheck your blood pressure.   Return to ER if you have severe headaches, chest pain, trouble breathing, vomiting.

## 2016-06-29 NOTE — ED Notes (Signed)
Patient is back from imaging. Jonni Sanger Med Tech at bedside for IV access.

## 2016-06-29 NOTE — Progress Notes (Signed)
Patient: Sara Huynh Female    DOB: 11-23-1950   66 y.o.   MRN: [id number] Visit Date: 06/29/2016  Today's Provider: Lelon Huh, MD   Chief Complaint  Patient presents with  . Hypertension   Subjective:    HPI Patient comes in today c/o elevated BP X 2-3 weeks. Patient also mentions that she has had an associated headache. Patient reports that last night she checked her BP last night it was 198/88. Patient reports that she has a pressure sensation in her chest, nausea, and upper back pain. Patient denies numbness or tingling in her extremities or shortness of breath with exertion. Patient has not been taking anything OTC for her symptoms.   States headache has been present for a few weeks which she thought was due to allergies so she started taking allergies tablets which helped for several days, but seemed to stop working the last couple of days. Has not taken any allergy medications the last two days.  States headache kept her up last night and rates it as a 12 out of 10 today. No mental statues changes. No dyspnea.   Having pressure in chest and pain  in mid back.since yesterday   No shortness of breath.   Was previously on hctz for BP in the past, but has been off for a few years with well controlled blood pressure.     Allergies  Allergen Reactions  . Prednisone Itching    Other reaction(s): Other (See Comments) Makes skin crawl and make pt aggitated     Current Outpatient Prescriptions:  .  Cholecalciferol (VITAMIN D3) 1000 units CAPS, Take 1 capsule by mouth daily. , Disp: , Rfl:  .  escitalopram (LEXAPRO) 10 MG tablet, Take 1 tablet (10 mg total) by mouth daily., Disp: 90 tablet, Rfl: 3 .  Linaclotide (LINZESS) 145 MCG CAPS capsule, Take 145 mcg by mouth daily. , Disp: , Rfl:  .  omeprazole-sodium bicarbonate (ZEGERID) 40-1100 MG capsule, Take 1 capsule by mouth 2 (two) times daily. , Disp: , Rfl:  .  ondansetron (ZOFRAN) 8 MG tablet, Take 1 tablet by mouth  as needed., Disp: , Rfl:  .  simvastatin (ZOCOR) 10 MG tablet, Take 1 tablet (10 mg total) by mouth every evening., Disp: 90 tablet, Rfl: 3 .  sucralfate (CARAFATE) 1 g tablet, Take 1 g by mouth 4 (four) times daily. , Disp: , Rfl:  .  vitamin B-12 (CYANOCOBALAMIN) 100 MCG tablet, Take 100 mcg by mouth daily. , Disp: , Rfl:  .  vitamin E 1000 UNIT capsule, Take 1,000 Units by mouth daily. , Disp: , Rfl:  .  dicyclomine (BENTYL) 10 MG capsule, Take 10 mg by mouth 4 (four) times daily., Disp: , Rfl:   Review of Systems  Constitutional: Positive for activity change and fatigue.  Respiratory: Negative for shortness of breath.   Cardiovascular: Positive for chest pain. Negative for palpitations and leg swelling.  Gastrointestinal: Positive for nausea.  Musculoskeletal: Positive for back pain.  Neurological: Positive for light-headedness and headaches. Negative for dizziness, syncope, speech difficulty, weakness and numbness.  Psychiatric/Behavioral: Negative.     Social History  Substance Use Topics  . Smoking status: Never Smoker  . Smokeless tobacco: Never Used  . Alcohol use No   Objective:   BP (!) 182/102 (BP Location: Left Arm, Patient Position: Sitting, Cuff Size: Normal)   Pulse 80   Temp 98.9 F (37.2 C)   Resp 16   Wt  122 lb (55.3 kg)   BMI 23.83 kg/m  Vitals:   06/29/16 1000 06/29/16 1029  BP: (!) 182/102 (!) 200/140  Pulse: 80   Resp: 16   Temp: 98.9 F (37.2 C)   Weight: 122 lb (55.3 kg)      Physical Exam   General Appearance:    Alert, cooperative, no distress  Eyes:    PERRL, conjunctiva/corneas clear, EOM's intact       Lungs:     Clear to auscultation bilaterally, respirations unlabored  Heart:    Regular rate and rhythm  Neurologic:   Awake, alert, oriented x 3. No apparent focal neurological           defect.       EKG: NSR    Assessment & Plan:     1. Hypertensive urgency Patient sent directly to emergency room. Notified ER triage nurse.  -  EKG 12-Lead       Lelon Huh, MD  [facility]

## 2016-06-30 ENCOUNTER — Telehealth: Payer: Self-pay | Admitting: Family Medicine

## 2016-06-30 NOTE — Telephone Encounter (Signed)
Pt was discharged from [facility] 06/29/16 for blood pressure issues and headache.  I have scheduled a hospital follow up appointment/MW

## 2016-07-07 ENCOUNTER — Ambulatory Visit (INDEPENDENT_AMBULATORY_CARE_PROVIDER_SITE_OTHER): Payer: Medicare Other | Admitting: Family Medicine

## 2016-07-07 ENCOUNTER — Encounter: Payer: Self-pay | Admitting: Family Medicine

## 2016-07-07 VITALS — BP 110/64 | HR 74 | Temp 98.5°F | Resp 16 | Ht 60.0 in | Wt 120.0 lb

## 2016-07-07 DIAGNOSIS — I10 Essential (primary) hypertension: Secondary | ICD-10-CM | POA: Diagnosis not present

## 2016-07-07 DIAGNOSIS — E7800 Pure hypercholesterolemia, unspecified: Secondary | ICD-10-CM | POA: Diagnosis not present

## 2016-07-07 NOTE — Progress Notes (Signed)
Patient: Sara Huynh Female    DOB: March 10, 1951   66 y.o.   MRN: [id number] Visit Date: 07/07/2016  Today's Provider: Lelon Huh, MD   Chief Complaint  Patient presents with  . Hospitalization Follow-up   Subjective:    HPI   Follow Up ER Visit  Patient is here for ER follow up.  She was recently seen at [facility] for hypertensive urgency She had normal CT head and CXR unremarkable. Normal met C, cbc and troponins.Started on  hctz 25 mg and lisinopril 10 mg daily. She reports good compliance with treatment. She reports this condition is Improved/ patient is still having headaches but much less intense She is feeling more fatigued since starting medications. Home BP have been in the 100s to 110s/50s.  She is scheduled for eye exam in April.   ----------------------------------------------------------------    Allergies  Allergen Reactions  . Prednisone Itching    Other reaction(s): Other (See Comments) Makes skin crawl and make pt aggitated     Current Outpatient Prescriptions:  .  escitalopram (LEXAPRO) 10 MG tablet, Take 1 tablet (10 mg total) by mouth daily., Disp: 90 tablet, Rfl: 3 .  hydrochlorothiazide (HYDRODIURIL) 25 MG tablet, Take 1 tablet (25 mg total) by mouth daily., Disp: 30 tablet, Rfl: 0 .  linaclotide (LINZESS) 145 MCG CAPS capsule, Take 145 mcg by mouth daily. , Disp: , Rfl:  .  lisinopril (PRINIVIL,ZESTRIL) 10 MG tablet, Take 1 tablet (10 mg total) by mouth daily., Disp: 30 tablet, Rfl: 0 .  Multiple Vitamins-Minerals (MULTIVITAMIN ADULT PO), Take 1 tablet by mouth daily., Disp: , Rfl:  .  omeprazole-sodium bicarbonate (ZEGERID) 40-1100 MG capsule, Take 1 capsule by mouth 3 (three) times daily with meals. , Disp: , Rfl:  .  ondansetron (ZOFRAN) 8 MG tablet, Take 1 tablet by mouth as needed., Disp: , Rfl:  .  simvastatin (ZOCOR) 10 MG tablet, Take 1 tablet (10 mg total) by mouth every evening., Disp: 90 tablet, Rfl: 3 .  sucralfate (CARAFATE) 1  g tablet, Take 1 g by mouth 4 (four) times daily. , Disp: , Rfl:  .  vitamin B-12 (CYANOCOBALAMIN) 1000 MCG tablet, Take 1,000 mcg by mouth daily. , Disp: , Rfl:   Review of Systems  Constitutional: Negative for appetite change, chills, fatigue and fever.  Respiratory: Negative for chest tightness and shortness of breath.   Cardiovascular: Negative for chest pain and palpitations.  Gastrointestinal: Negative for abdominal pain, nausea and vomiting.  Neurological: Negative for dizziness and weakness.    Social History  Substance Use Topics  . Smoking status: Never Smoker  . Smokeless tobacco: Never Used  . Alcohol use No   Objective:   BP 110/64 (BP Location: Right Arm, Patient Position: Sitting, Cuff Size: Normal)   Pulse 74   Temp 98.5 F (36.9 C) (Oral)   Resp 16   Ht 5' (1.524 m)   Wt 120 lb (54.4 kg)   BMI 23.44 kg/m  Vitals:   07/07/16 0946  BP: 110/64  Pulse: 74  Resp: 16  Temp: 98.5 F (36.9 C)  TempSrc: Oral  Weight: 120 lb (54.4 kg)  Height: 5' (1.524 m)     Physical Exam   General Appearance:    Alert, cooperative, no distress  Eyes:    PERRL, conjunctiva/corneas clear, EOM's intact       Lungs:     Clear to auscultation bilaterally, respirations unlabored  Heart:    Regular rate and  rhythm  Neurologic:   Awake, alert, oriented x 3. No apparent focal neurological           defect.            Assessment & Plan:     1. Essential hypertension Much better since starting lisinopril and hctz. Check labs in 2 weeks. If stable will change to lisinopril 10/12.5 - Renal function panel  2. Hypercholesteremia Lab Results  Component Value Date   CHOL 229 (H) 08/21/2015   HDL 62 08/21/2015   LDLCALC 129 (H) 08/21/2015   TRIG 191 (H) 08/21/2015   CHOLHDL 3.7 08/21/2015    - Lipid panel       Lelon Huh, MD  [facility]

## 2016-07-21 DIAGNOSIS — I10 Essential (primary) hypertension: Secondary | ICD-10-CM | POA: Diagnosis not present

## 2016-07-21 DIAGNOSIS — E7800 Pure hypercholesterolemia, unspecified: Secondary | ICD-10-CM | POA: Diagnosis not present

## 2016-07-22 ENCOUNTER — Other Ambulatory Visit: Payer: Self-pay

## 2016-07-22 LAB — RENAL FUNCTION PANEL
ALBUMIN: 4.7 g/dL (ref 3.6–4.8)
BUN/Creatinine Ratio: 10 — ABNORMAL LOW (ref 12–28)
BUN: 11 mg/dL (ref 8–27)
CHLORIDE: 96 mmol/L (ref 96–106)
CO2: 28 mmol/L (ref 18–29)
Calcium: 10.2 mg/dL (ref 8.7–10.3)
Creatinine, Ser: 1.1 mg/dL — ABNORMAL HIGH (ref 0.57–1.00)
GFR calc non Af Amer: 53 mL/min/{1.73_m2} — ABNORMAL LOW (ref >59)
GFR, EST AFRICAN AMERICAN: 61 mL/min/{1.73_m2} (ref >59)
GLUCOSE: 107 mg/dL — AB (ref 65–99)
PHOSPHORUS: 3.9 mg/dL (ref 2.5–4.5)
POTASSIUM: 4.4 mmol/L (ref 3.5–5.2)
SODIUM: 141 mmol/L (ref 134–144)

## 2016-07-22 LAB — LIPID PANEL
Chol/HDL Ratio: 3.9 ratio (ref 0.0–4.4)
Cholesterol, Total: 243 mg/dL — ABNORMAL HIGH (ref 100–199)
HDL: 63 mg/dL (ref >39)
LDL Calculated: 140 mg/dL — ABNORMAL HIGH (ref 0–99)
Triglycerides: 200 mg/dL — ABNORMAL HIGH (ref 0–149)
VLDL Cholesterol Cal: 40 mg/dL (ref 5–40)

## 2016-07-22 MED ORDER — SIMVASTATIN 20 MG PO TABS: 20.0000 mg | ORAL_TABLET | Freq: Every day | ORAL | 3 refills | Status: DC

## 2016-07-22 MED ORDER — LISINOPRIL-HYDROCHLOROTHIAZIDE 10-12.5 MG PO TABS: 1.0000 | ORAL_TABLET | Freq: Every day | ORAL | 3 refills | Status: DC

## 2016-07-22 NOTE — Progress Notes (Signed)
Patient advised, medications changes made. ED

## 2016-08-10 ENCOUNTER — Other Ambulatory Visit: Payer: Self-pay | Admitting: Family Medicine

## 2016-08-10 MED ORDER — ESCITALOPRAM OXALATE 10 MG PO TABS: 10.0000 mg | ORAL_TABLET | Freq: Every day | ORAL | 4 refills | Status: DC

## 2016-08-10 NOTE — Addendum Note (Signed)
Addended by: Birdie Sons on: 08/10/2016 10:56 AM   Modules accepted: Orders

## 2016-08-11 ENCOUNTER — Other Ambulatory Visit: Payer: Self-pay | Admitting: Family Medicine

## 2016-08-11 MED ORDER — ESCITALOPRAM OXALATE 10 MG PO TABS: 10.0000 mg | ORAL_TABLET | Freq: Every day | ORAL | 2 refills | Status: DC

## 2016-08-11 NOTE — Telephone Encounter (Signed)
Please call escitalopram into [facility]. E-prescription errorred out.

## 2016-08-11 NOTE — Addendum Note (Signed)
Addended by: Arnette Norris on: 08/11/2016 04:33 PM   Modules accepted: Orders

## 2016-08-13 ENCOUNTER — Other Ambulatory Visit: Payer: Self-pay | Admitting: Family Medicine

## 2016-08-13 MED ORDER — ESCITALOPRAM OXALATE 10 MG PO TABS: 10.0000 mg | ORAL_TABLET | Freq: Every day | ORAL | 4 refills | Status: DC

## 2016-08-13 NOTE — Telephone Encounter (Signed)
[facility] pharmacy faxed a request on the following medication. Thanks CC  escitalopram (LEXAPRO) 10 MG tablet  Take 1 tablet by mouth daily

## 2016-08-25 ENCOUNTER — Ambulatory Visit (INDEPENDENT_AMBULATORY_CARE_PROVIDER_SITE_OTHER): Payer: Medicare Other

## 2016-08-25 VITALS — BP 110/72 | HR 68 | Temp 98.6°F | Ht 60.0 in | Wt 120.6 lb

## 2016-08-25 DIAGNOSIS — Z0000 Encounter for general adult medical examination without abnormal findings: Secondary | ICD-10-CM | POA: Diagnosis not present

## 2016-08-25 NOTE — Patient Instructions (Signed)
[name] , Thank you for taking time to come for your Medicare Wellness Visit. I appreciate your ongoing commitment to your health goals. Please review the following plan we discussed and let me know if I can assist you in the future.   Screening recommendations/referrals: Colonoscopy: completed 07/13/12, due 07/2022 Mammogram: declined, pt to set up this year Bone Density: declined order today Recommended yearly ophthalmology/optometry visit for glaucoma screening and checkup Recommended yearly dental visit for hygiene and checkup  Vaccinations: Influenza vaccine: up to date, due 12/2016 Pneumococcal vaccine: Prevnar 13 given 01/03/16, Pneumovax 23 due 01/02/17 Tdap vaccine: completed 10/05/10, due 09/2020 Shingles vaccine: declined    Advanced directives: Advance directive discussed with you today. I have provided a copy for you to complete at home and have notarized. Once this is complete please bring a copy in to our office so we can scan it into your chart.  Conditions/risks identified: Recommend increasing water intake to 4 glasses a day.  Next appointment: Next none, need to schedule 1 year AWV and follow up wellness with PCP.   Preventive Care 64 Years and Older, Female Preventive care refers to lifestyle choices and visits with your health care provider that can promote health and wellness. What does preventive care include?  A yearly physical exam. This is also called an annual well check.  Dental exams once or twice a year.  Routine eye exams. Ask your health care provider how often you should have your eyes checked.  Personal lifestyle choices, including:  Daily care of your teeth and gums.  Regular physical activity.  Eating a healthy diet.  Avoiding tobacco and drug use.  Limiting alcohol use.  Practicing safe sex.  Taking low-dose aspirin every day.  Taking vitamin and mineral supplements as recommended by your health care provider. What happens  during an annual well check? The services and screenings done by your health care provider during your annual well check will depend on your age, overall health, lifestyle risk factors, and family history of disease. Counseling  Your health care provider may ask you questions about your:  Alcohol use.  Tobacco use.  Drug use.  Emotional well-being.  Home and relationship well-being.  Sexual activity.  Eating habits.  History of falls.  Memory and ability to understand (cognition).  Work and work Statistician.  Reproductive health. Screening  You may have the following tests or measurements:  Height, weight, and BMI.  Blood pressure.  Lipid and cholesterol levels. These may be checked every 5 years, or more frequently if you are over 92 years old.  Skin check.  Lung cancer screening. You may have this screening every year starting at age 20 if you have a 30-pack-year history of smoking and currently smoke or have quit within the past 15 years.  Fecal occult blood test (FOBT) of the stool. You may have this test every year starting at age 56.  Flexible sigmoidoscopy or colonoscopy. You may have a sigmoidoscopy every 5 years or a colonoscopy every 10 years starting at age 51.  Hepatitis C blood test.  Hepatitis B blood test.  Sexually transmitted disease (STD) testing.  Diabetes screening. This is done by checking your blood sugar (glucose) after you have not eaten for a while (fasting). You may have this done every 1-3 years.  Bone density scan. This is done to screen for osteoporosis. You may have this done starting at age 75.  Mammogram. This may be done every 1-2 years. Talk to your health  care provider about how often you should have regular mammograms. Talk with your health care provider about your test results, treatment options, and if necessary, the need for more tests. Vaccines  Your health care provider may recommend certain vaccines, such  as:  Influenza vaccine. This is recommended every year.  Tetanus, diphtheria, and acellular pertussis (Tdap, Td) vaccine. You may need a Td booster every 10 years.  Zoster vaccine. You may need this after age 57.  Pneumococcal 13-valent conjugate (PCV13) vaccine. One dose is recommended after age 44.  Pneumococcal polysaccharide (PPSV23) vaccine. One dose is recommended after age 14. Talk to your health care provider about which screenings and vaccines you need and how often you need them. This information is not intended to replace advice given to you by your health care provider. Make sure you discuss any questions you have with your health care provider. Document Released: 04/25/2015 Document Revised: 12/17/2015 Document Reviewed: 01/28/2015 Elsevier Interactive Patient Education  2017 [place] Prevention in the Home Falls can cause injuries. They can happen to people of all ages. There are many things you can do to make your home safe and to help prevent falls. What can I do on the outside of my home?  Regularly fix the edges of walkways and driveways and fix any cracks.  Remove anything that might make you trip as you walk through a door, such as a raised step or threshold.  Trim any bushes or trees on the path to your home.  Use bright outdoor lighting.  Clear any walking paths of anything that might make someone trip, such as rocks or tools.  Regularly check to see if handrails are loose or broken. Make sure that both sides of any steps have handrails.  Any raised decks and porches should have guardrails on the edges.  Have any leaves, snow, or ice cleared regularly.  Use sand or salt on walking paths during winter.  Clean up any spills in your garage right away. This includes oil or grease spills. What can I do in the bathroom?  Use night lights.  Install grab bars by the toilet and in the tub and shower. Do not use towel bars as grab bars.  Use  non-skid mats or decals in the tub or shower.  If you need to sit down in the shower, use a plastic, non-slip stool.  Keep the floor dry. Clean up any water that spills on the floor as soon as it happens.  Remove soap buildup in the tub or shower regularly.  Attach bath mats securely with double-sided non-slip rug tape.  Do not have throw rugs and other things on the floor that can make you trip. What can I do in the bedroom?  Use night lights.  Make sure that you have a light by your bed that is easy to reach.  Do not use any sheets or blankets that are too big for your bed. They should not hang down onto the floor.  Have a firm chair that has side arms. You can use this for support while you get dressed.  Do not have throw rugs and other things on the floor that can make you trip. What can I do in the kitchen?  Clean up any spills right away.  Avoid walking on wet floors.  Keep items that you use a lot in easy-to-reach places.  If you need to reach something above you, use a strong step stool that has a grab  bar.  Keep electrical cords out of the way.  Do not use floor polish or wax that makes floors slippery. If you must use wax, use non-skid floor wax.  Do not have throw rugs and other things on the floor that can make you trip. What can I do with my stairs?  Do not leave any items on the stairs.  Make sure that there are handrails on both sides of the stairs and use them. Fix handrails that are broken or loose. Make sure that handrails are as long as the stairways.  Check any carpeting to make sure that it is firmly attached to the stairs. Fix any carpet that is loose or worn.  Avoid having throw rugs at the top or bottom of the stairs. If you do have throw rugs, attach them to the floor with carpet tape.  Make sure that you have a light switch at the top of the stairs and the bottom of the stairs. If you do not have them, ask someone to add them for you. What  else can I do to help prevent falls?  Wear shoes that:  Do not have high heels.  Have rubber bottoms.  Are comfortable and fit you well.  Are closed at the toe. Do not wear sandals.  If you use a stepladder:  Make sure that it is fully opened. Do not climb a closed stepladder.  Make sure that both sides of the stepladder are locked into place.  Ask someone to hold it for you, if possible.  Clearly mark and make sure that you can see:  Any grab bars or handrails.  First and last steps.  Where the edge of each step is.  Use tools that help you move around (mobility aids) if they are needed. These include:  Canes.  Walkers.  Scooters.  Crutches.  Turn on the lights when you go into a dark area. Replace any light bulbs as soon as they burn out.  Set up your furniture so you have a clear path. Avoid moving your furniture around.  If any of your floors are uneven, fix them.  If there are any pets around you, be aware of where they are.  Review your medicines with your doctor. Some medicines can make you feel dizzy. This can increase your chance of falling. Ask your doctor what other things that you can do to help prevent falls. This information is not intended to replace advice given to you by your health care provider. Make sure you discuss any questions you have with your health care provider. Document Released: 01/23/2009 Document Revised: 09/04/2015 Document Reviewed: 05/03/2014 Elsevier Interactive Patient Education  2017 [employer].

## 2016-08-25 NOTE — Progress Notes (Signed)
Subjective:   Sara Huynh is a 66 y.o. female who presents for an Initial Medicare Annual Wellness Visit.  Review of Systems    N/A  Cardiac Risk Factors include: advanced age (>26men, >64 women);hypertension;dyslipidemia     Objective:    Today's Vitals   08/25/16 0929 08/25/16 0935  BP: 110/72   Pulse: 68   Temp: 98.6 F (37 C)   TempSrc: Oral   Weight: 120 lb 9.6 oz (54.7 kg)   Height: 5' (1.524 m)   PainSc: 0-No pain 0-No pain   Body mass index is 23.55 kg/m.   Current Medications (verified) Outpatient Encounter Prescriptions as of 08/25/2016  Medication Sig  . acetaminophen (TYLENOL) 500 MG tablet Take 1,000 mg by mouth every 8 (eight) hours as needed.   Marland Kitchen escitalopram (LEXAPRO) 10 MG tablet Take 1 tablet (10 mg total) by mouth daily.  Marland Kitchen HYDROcodone-acetaminophen (NORCO/VICODIN) 5-325 MG tablet Take by mouth every 6 (six) hours as needed.   . linaclotide (LINZESS) 145 MCG CAPS capsule Take 145 mcg by mouth daily.   Marland Kitchen lisinopril-hydrochlorothiazide (PRINZIDE,ZESTORETIC) 10-12.5 MG tablet Take 1 tablet by mouth daily.  . Multiple Vitamins-Minerals (MULTIVITAMIN ADULT PO) Take 1 tablet by mouth daily.  Marland Kitchen omeprazole-sodium bicarbonate (ZEGERID) 40-1100 MG capsule Take 1 capsule by mouth 2 (two) times daily.   . ondansetron (ZOFRAN) 8 MG tablet Take 1 tablet by mouth as needed.  . simvastatin (ZOCOR) 20 MG tablet Take 1 tablet (20 mg total) by mouth at bedtime.  . sucralfate (CARAFATE) 1 g tablet Take 1 g by mouth 4 (four) times daily.   . vitamin B-12 (CYANOCOBALAMIN) 1000 MCG tablet Take 1,000 mcg by mouth daily.    No facility-administered encounter medications on file as of 08/25/2016.     Allergies (verified) Prednisone   History: Past Medical History:  Diagnosis Date  . Anxiety   . GERD (gastroesophageal reflux disease)   . Hyperlipidemia   . Hypertension   . Scleroderma [facility])    Past Surgical History:  Procedure Laterality Date  . ABDOMINAL  HYSTERECTOMY  1986  . APPENDECTOMY    . CHOLECYSTECTOMY  03/2009  . G tube placed for 18 mths   2007  . gi pacemaker  2002  . NASAL SINUS SURGERY  2005  . PYLOROPLASTY  07/15/15   Family History  Problem Relation Age of Onset  . CAD Mother   . Heart disease Mother        MI  . Dementia Mother   . Alzheimer's disease Mother   . CVA Father   . COPD Father   . Emphysema Father    Social History   Occupational History  . Not on file.   Social History Main Topics  . Smoking status: Never Smoker  . Smokeless tobacco: Never Used  . Alcohol use No  . Drug use: No  . Sexual activity: Not on file    Tobacco Counseling Counseling given: Not Answered   Activities of Daily Living In your present state of health, do you have any difficulty performing the following activities: 08/25/2016  Hearing? N  Vision? N  Difficulty concentrating or making decisions? N  Walking or climbing stairs? N  Dressing or bathing? N  Doing errands, shopping? N  Preparing Food and eating ? N  Using the Toilet? N  In the past six months, have you accidently leaked urine? N  Do you have problems with loss of bowel control? N  Managing your Medications? N  Managing your Finances? N  Housekeeping or managing your Housekeeping? N  Some recent data might be hidden    Immunizations and Health Maintenance Immunization History  Administered Date(s) Administered  . Influenza, High Dose Seasonal PF 01/03/2016  . Influenza,inj,Quad PF,36+ Mos 01/11/2015  . Pneumococcal Conjugate-13 01/03/2016  . Td 10/05/2010  . Tdap 10/05/2010   There are no preventive care reminders to display for this patient.  Patient Care Team: Birdie Sons, MD as PCP - General (Family Medicine) Leandrew Koyanagi, MD as Referring Physician (Ophthalmology) Scherrie November, MD as Referring Physician (Internal Medicine) Isaias Cowman, MD as Consulting Physician (Cardiology)  Indicate any recent Medical Services you  may have received from other than [facility] providers in the past year (date may be approximate).     Assessment:   This is a routine wellness examination for [place].   Hearing/Vision screen Vision Screening Comments: Pt sees Dr Wallace Going for vision checks once yearly.   Dietary issues and exercise activities discussed: Current Exercise Habits: Home exercise routine, Type of exercise: walking, Time (Minutes): 30 (8000-10000 steps at a time), Frequency (Times/Week): 5, Weekly Exercise (Minutes/Week): 150, Intensity: Mild, Exercise limited by: None identified  Goals    . Increase water intake          Recommend increasing water intake to 4 glasses a day.      Depression Screen PHQ 2/9 Scores 08/25/2016 08/25/2016 08/21/2015  PHQ - 2 Score 0 0 0  PHQ- 9 Score 0 - -    Fall Risk Fall Risk  08/25/2016 08/21/2015  Falls in the past year? No No    Cognitive Function:     6CIT Screen 08/25/2016  What Year? 0 points  What month? 0 points  What time? 0 points  Count back from 20 0 points  Months in reverse 0 points  Repeat phrase 4 points  Total Score 4    Screening Tests Health Maintenance  Topic Date Due  . MAMMOGRAM  08/10/2017 (Originally 07/30/2016)  . DEXA SCAN  08/10/2017 (Originally 11/29/2015)  . INFLUENZA VACCINE  11/10/2016  . PNA vac Low Risk Adult (2 of 2 - PPSV23) 01/02/2017  . COLONOSCOPY  07/13/2017  . TETANUS/TDAP  10/04/2020  . Hepatitis C Screening  Completed  . HIV Screening  Completed      Plan:  I have personally reviewed and addressed the Medicare Annual Wellness questionnaire and have noted the following in the patient's chart:  A. Medical and social history B. Use of alcohol, tobacco or illicit drugs  C. Current medications and supplements D. Functional ability and status E.  Nutritional status F.  Physical activity G. Advance directives H. List of other physicians I.  Hospitalizations, surgeries, and ER visits in previous 12 months J.   [place] such as hearing and vision if needed, cognitive and depression L. Referrals and appointments - none  In addition, I have reviewed and discussed with patient certain preventive protocols, quality metrics, and best practice recommendations. A written personalized care plan for preventive services as well as general preventive health recommendations were provided to patient.  See attached scanned questionnaire for additional information.   Signed,  Fabio Neighbors, LPN Nurse Health Advisor   MD Recommendations: Pt declined mammogram today but states she will get this done this year. Pt declined DEXA scan order today.  I have reviewed the health advisor's note, was available for consultation, and agree with documentation and plan  Lelon Huh, MD

## 2016-10-22 ENCOUNTER — Ambulatory Visit (INDEPENDENT_AMBULATORY_CARE_PROVIDER_SITE_OTHER): Payer: Medicare Other | Admitting: Family Medicine

## 2016-10-22 VITALS — BP 102/64 | HR 72 | Temp 98.3°F | Resp 12 | Wt 120.0 lb

## 2016-10-22 DIAGNOSIS — M349 Systemic sclerosis, unspecified: Secondary | ICD-10-CM

## 2016-10-22 DIAGNOSIS — E7800 Pure hypercholesterolemia, unspecified: Secondary | ICD-10-CM

## 2016-10-22 DIAGNOSIS — Z1231 Encounter for screening mammogram for malignant neoplasm of breast: Secondary | ICD-10-CM

## 2016-10-22 DIAGNOSIS — E2839 Other primary ovarian failure: Secondary | ICD-10-CM | POA: Diagnosis not present

## 2016-10-22 DIAGNOSIS — I10 Essential (primary) hypertension: Secondary | ICD-10-CM

## 2016-10-22 DIAGNOSIS — Z1239 Encounter for other screening for malignant neoplasm of breast: Secondary | ICD-10-CM

## 2016-10-22 DIAGNOSIS — R5383 Other fatigue: Secondary | ICD-10-CM

## 2016-10-22 NOTE — Patient Instructions (Signed)
   The CDC recommends two doses of the shingles vaccine, Shingrix, separated by 2 to 6 months for adults age 66 years and older. I recommend checking with your insurance plan regarding coverage for this vaccine.

## 2016-10-22 NOTE — Progress Notes (Signed)
Patient: Sara Huynh Female    DOB: [birth year]   66 y.o.   MRN: [id number] Visit Date: 10/22/2016  Today's Provider: Lelon Huh, MD   Chief Complaint  Patient presents with  . Hypertension  . Hyperlipidemia  . Other    Breast Exam   Subjective:    HPI  Patient is here for follow up. Patient saw [facility] for Wellness visit on 08/25/16. Patient had routine follow up visit in march 2018 and at that time routine lab work was done. After reviewing results of the lab work at that time Simvastatin was increased to 20 mg daily and lisinopril and HCTZ were combined into 1 tablet instead of taking it separately 10-12.5 mg. Patient is tolerating medications well. She is checking her b/p but not sure of the readings but knows they have been normal on the lower side. No cardiac symptoms present.  BP Readings from Last 3 Encounters:  10/22/16 102/64  08/25/16 110/72  07/07/16 110/64   Wt Readings from Last 3 Encounters:  10/22/16 120 lb (54.4 kg)  08/25/16 120 lb 9.6 oz (54.7 kg)  07/07/16 120 lb (54.4 kg)   Last Colonoscopy was 07/13/12 diverticulosis. Mammogram 07/30/14 negative-patient states she will stop by [facility] office and make appointment for this. Pap smear-years ago, patient had total hysterectomy in 1986. BMD-never.    Immunization History  Administered Date(s) Administered  . Influenza, High Dose Seasonal PF 01/03/2016  . Influenza,inj,Quad PF,36+ Mos 01/11/2015  . Pneumococcal Conjugate-13 01/03/2016  . Td 10/05/2010  . Tdap 10/05/2010    Allergies  Allergen Reactions  . Prednisone Itching    Other reaction(s): Other (See Comments) Makes skin crawl and make pt aggitated     Current Outpatient Prescriptions:  .  acetaminophen (TYLENOL) 500 MG tablet, Take 1,000 mg by mouth every 8 (eight) hours as needed. , Disp: , Rfl:  .  escitalopram (LEXAPRO) 10 MG tablet, Take 1 tablet (10 mg total) by mouth daily., Disp: 90 tablet, Rfl: 4 .   linaclotide (LINZESS) 145 MCG CAPS capsule, Take 145 mcg by mouth daily. , Disp: , Rfl:  .  lisinopril-hydrochlorothiazide (PRINZIDE,ZESTORETIC) 10-12.5 MG tablet, Take 1 tablet by mouth daily., Disp: 90 tablet, Rfl: 3 .  Multiple Vitamins-Minerals (MULTIVITAMIN ADULT PO), Take 1 tablet by mouth daily., Disp: , Rfl:  .  omeprazole-sodium bicarbonate (ZEGERID) 40-1100 MG capsule, Take 1 capsule by mouth 2 (two) times daily. , Disp: , Rfl:  .  simvastatin (ZOCOR) 20 MG tablet, Take 1 tablet (20 mg total) by mouth at bedtime., Disp: 90 tablet, Rfl: 3 .  sucralfate (CARAFATE) 1 g tablet, Take 1 g by mouth 4 (four) times daily. , Disp: , Rfl:  .  vitamin B-12 (CYANOCOBALAMIN) 1000 MCG tablet, Take 1,000 mcg by mouth daily. , Disp: , Rfl:  .  HYDROcodone-acetaminophen (NORCO/VICODIN) 5-325 MG tablet, Take by mouth every 6 (six) hours as needed. , Disp: , Rfl:  .  ondansetron (ZOFRAN) 8 MG tablet, Take 1 tablet by mouth as needed., Disp: , Rfl:   Review of Systems  Constitutional: Positive for fatigue.  HENT: Negative.   Eyes: Negative.   Respiratory: Negative.   Cardiovascular: Negative.   Gastrointestinal: Positive for abdominal pain, nausea and vomiting.  Endocrine: Positive for cold intolerance.  Genitourinary: Negative.   Musculoskeletal: Negative.   Skin: Negative.   Allergic/Immunologic: Positive for environmental allergies.  Neurological: Negative.   Hematological: Negative.   Psychiatric/Behavioral: Negative.     Social  History  Substance Use Topics  . Smoking status: Never Smoker  . Smokeless tobacco: Never Used  . Alcohol use No   Objective:   BP 102/64   Pulse 72   Temp 98.3 F (36.8 C)   Resp 12   Wt 120 lb (54.4 kg)   BMI 23.44 kg/m  Vitals:   10/22/16 0904  BP: 102/64  Pulse: 72  Resp: 12  Temp: 98.3 F (36.8 C)  Weight: 120 lb (54.4 kg)     Physical Exam  Constitutional: She is oriented to person, place, and time. She appears well-developed and  well-nourished.  HENT:  Head: Normocephalic and atraumatic.  Right Ear: External ear normal.  Left Ear: External ear normal.  Mouth/Throat: Oropharynx is clear and moist.  Eyes: Pupils are equal, round, and reactive to light. Conjunctivae are normal.  Neck: Normal range of motion. Neck supple.  Cardiovascular: Normal rate, regular rhythm, normal heart sounds and intact distal pulses.  Exam reveals no gallop.   No murmur heard. Pulmonary/Chest: Effort normal and breath sounds normal. No respiratory distress. She has no wheezes. Right breast exhibits no mass, no nipple discharge and no skin change. Left breast exhibits no mass, no nipple discharge and no skin change.  Abdominal: Soft. She exhibits no distension. There is no tenderness.  Musculoskeletal: She exhibits no edema or tenderness.  Neurological: She is alert and oriented to person, place, and time.        Assessment & Plan:     1. Estrogen deficiency  - DG Bone Density; Future  2. Breast cancer screening Breast exam done. She has contact information to schedule mammogram at [facility].   3. Hypercholesteremia .She is tolerating simvastatin well with no adverse effects.   - Comprehensive metabolic panel - Lipid panel  4. Essential hypertension Well controlled.  Continue current medications. Advised we would consider lowing dose of lisinopril-hctz since she has been feeling fatigued and her BP has been pretty low.   5. Other fatigue  - CBC with Differential/Platelet - TSH  6. Systemic sclerosis ([place]) Continue routine follow up GI in [place]  7. Scleroderma ([place])        Lelon Huh, MD  [facility]

## 2016-10-23 LAB — TSH: TSH: 3.85 u[IU]/mL (ref 0.450–4.500)

## 2016-10-23 LAB — COMPREHENSIVE METABOLIC PANEL
A/G RATIO: 1.7 (ref 1.2–2.2)
ALT: 20 IU/L (ref 0–32)
AST: 27 IU/L (ref 0–40)
Albumin: 4.4 g/dL (ref 3.6–4.8)
Alkaline Phosphatase: 51 IU/L (ref 39–117)
BUN/Creatinine Ratio: 8 — ABNORMAL LOW (ref 12–28)
BUN: 8 mg/dL (ref 8–27)
Bilirubin Total: 0.6 mg/dL (ref 0.0–1.2)
CALCIUM: 9.9 mg/dL (ref 8.7–10.3)
CHLORIDE: 99 mmol/L (ref 96–106)
CO2: 29 mmol/L (ref 20–29)
Creatinine, Ser: 1.02 mg/dL — ABNORMAL HIGH (ref 0.57–1.00)
GFR calc Af Amer: 67 mL/min/{1.73_m2} (ref >59)
GFR calc non Af Amer: 58 mL/min/{1.73_m2} — ABNORMAL LOW (ref >59)
GLUCOSE: 94 mg/dL (ref 65–99)
Globulin, Total: 2.6 g/dL (ref 1.5–4.5)
POTASSIUM: 4.8 mmol/L (ref 3.5–5.2)
Sodium: 144 mmol/L (ref 134–144)
Total Protein: 7 g/dL (ref 6.0–8.5)

## 2016-10-23 LAB — CBC WITH DIFFERENTIAL/PLATELET
Basophils Absolute: 0 10*3/uL (ref 0.0–0.2)
Basos: 1 %
EOS (ABSOLUTE): 0.1 10*3/uL (ref 0.0–0.4)
EOS: 2 %
HEMATOCRIT: 37.6 % (ref 34.0–46.6)
HEMOGLOBIN: 12 g/dL (ref 11.1–15.9)
Immature Grans (Abs): 0 10*3/uL (ref 0.0–0.1)
Immature Granulocytes: 0 %
LYMPHS ABS: 2.2 10*3/uL (ref 0.7–3.1)
Lymphs: 35 %
MCH: 28.2 pg (ref 26.6–33.0)
MCHC: 31.9 g/dL (ref 31.5–35.7)
MCV: 88 fL (ref 79–97)
MONOCYTES: 9 %
Monocytes Absolute: 0.6 10*3/uL (ref 0.1–0.9)
NEUTROS ABS: 3.3 10*3/uL (ref 1.4–7.0)
Neutrophils: 53 %
Platelets: 235 10*3/uL (ref 150–379)
RBC: 4.26 x10E6/uL (ref 3.77–5.28)
RDW: 14.9 % (ref 12.3–15.4)
WBC: 6.3 10*3/uL (ref 3.4–10.8)

## 2016-10-23 LAB — LIPID PANEL
Chol/HDL Ratio: 3.6 ratio (ref 0.0–4.4)
Cholesterol, Total: 200 mg/dL — ABNORMAL HIGH (ref 100–199)
HDL: 55 mg/dL (ref >39)
LDL Calculated: 100 mg/dL — ABNORMAL HIGH (ref 0–99)
Triglycerides: 227 mg/dL — ABNORMAL HIGH (ref 0–149)
VLDL Cholesterol Cal: 45 mg/dL — ABNORMAL HIGH (ref 5–40)

## 2016-10-29 DIAGNOSIS — Z1231 Encounter for screening mammogram for malignant neoplasm of breast: Secondary | ICD-10-CM | POA: Diagnosis not present

## 2016-10-29 DIAGNOSIS — Z9289 Personal history of other medical treatment: Secondary | ICD-10-CM | POA: Diagnosis not present

## 2016-10-29 LAB — HM MAMMOGRAPHY

## 2016-11-01 ENCOUNTER — Encounter: Payer: Self-pay | Admitting: *Deleted

## 2016-11-05 ENCOUNTER — Encounter: Payer: Self-pay | Admitting: Family Medicine

## 2016-11-24 DIAGNOSIS — Z9889 Other specified postprocedural states: Secondary | ICD-10-CM | POA: Diagnosis not present

## 2016-11-24 DIAGNOSIS — K219 Gastro-esophageal reflux disease without esophagitis: Secondary | ICD-10-CM | POA: Diagnosis not present

## 2016-11-24 DIAGNOSIS — Z79899 Other long term (current) drug therapy: Secondary | ICD-10-CM | POA: Diagnosis not present

## 2016-11-24 DIAGNOSIS — Z888 Allergy status to other drugs, medicaments and biological substances status: Secondary | ICD-10-CM | POA: Diagnosis not present

## 2016-11-24 DIAGNOSIS — K2970 Gastritis, unspecified, without bleeding: Secondary | ICD-10-CM | POA: Diagnosis not present

## 2016-11-24 DIAGNOSIS — K3184 Gastroparesis: Secondary | ICD-10-CM | POA: Diagnosis not present

## 2016-11-24 DIAGNOSIS — K2210 Ulcer of esophagus without bleeding: Secondary | ICD-10-CM | POA: Diagnosis not present

## 2016-11-24 DIAGNOSIS — I10 Essential (primary) hypertension: Secondary | ICD-10-CM | POA: Diagnosis not present

## 2016-12-07 DIAGNOSIS — K3189 Other diseases of stomach and duodenum: Secondary | ICD-10-CM | POA: Diagnosis not present

## 2016-12-07 DIAGNOSIS — K3184 Gastroparesis: Secondary | ICD-10-CM | POA: Diagnosis not present

## 2016-12-20 ENCOUNTER — Other Ambulatory Visit: Payer: Medicare Other

## 2016-12-27 ENCOUNTER — Other Ambulatory Visit: Payer: Medicare Other

## 2016-12-31 DIAGNOSIS — K3184 Gastroparesis: Secondary | ICD-10-CM | POA: Diagnosis not present

## 2016-12-31 DIAGNOSIS — Z9889 Other specified postprocedural states: Secondary | ICD-10-CM | POA: Diagnosis not present

## 2016-12-31 DIAGNOSIS — K30 Functional dyspepsia: Secondary | ICD-10-CM | POA: Diagnosis not present

## 2017-01-08 ENCOUNTER — Ambulatory Visit (INDEPENDENT_AMBULATORY_CARE_PROVIDER_SITE_OTHER): Payer: Medicare Other

## 2017-01-08 DIAGNOSIS — Z23 Encounter for immunization: Secondary | ICD-10-CM

## 2017-01-26 ENCOUNTER — Other Ambulatory Visit: Payer: Medicare Other

## 2017-01-27 DIAGNOSIS — R1310 Dysphagia, unspecified: Secondary | ICD-10-CM | POA: Diagnosis not present

## 2017-01-27 DIAGNOSIS — I10 Essential (primary) hypertension: Secondary | ICD-10-CM | POA: Diagnosis not present

## 2017-01-27 DIAGNOSIS — K219 Gastro-esophageal reflux disease without esophagitis: Secondary | ICD-10-CM | POA: Diagnosis not present

## 2017-01-27 DIAGNOSIS — E785 Hyperlipidemia, unspecified: Secondary | ICD-10-CM | POA: Diagnosis not present

## 2017-01-27 DIAGNOSIS — Z79899 Other long term (current) drug therapy: Secondary | ICD-10-CM | POA: Diagnosis not present

## 2017-01-27 DIAGNOSIS — R112 Nausea with vomiting, unspecified: Secondary | ICD-10-CM | POA: Diagnosis not present

## 2017-01-27 DIAGNOSIS — T18128A Food in esophagus causing other injury, initial encounter: Secondary | ICD-10-CM | POA: Diagnosis not present

## 2017-04-13 ENCOUNTER — Emergency Department: Payer: Medicare Other

## 2017-04-13 ENCOUNTER — Encounter: Payer: Self-pay | Admitting: Emergency Medicine

## 2017-04-13 ENCOUNTER — Emergency Department
Admission: EM | Admit: 2017-04-13 | Discharge: 2017-04-13 | Disposition: A | Discharge status: Home / Self Care | Payer: Medicare Other | Attending: Emergency Medicine | Admitting: Emergency Medicine

## 2017-04-13 DIAGNOSIS — S0990XA Unspecified injury of head, initial encounter: Secondary | ICD-10-CM | POA: Diagnosis present

## 2017-04-13 DIAGNOSIS — Y9301 Activity, walking, marching and hiking: Secondary | ICD-10-CM | POA: Insufficient documentation

## 2017-04-13 DIAGNOSIS — Y92008 Other place in unspecified non-institutional (private) residence as the place of occurrence of the external cause: Secondary | ICD-10-CM | POA: Diagnosis not present

## 2017-04-13 DIAGNOSIS — W19XXXA Unspecified fall, initial encounter: Secondary | ICD-10-CM

## 2017-04-13 DIAGNOSIS — Y999 Unspecified external cause status: Secondary | ICD-10-CM | POA: Insufficient documentation

## 2017-04-13 DIAGNOSIS — S0083XA Contusion of other part of head, initial encounter: Secondary | ICD-10-CM | POA: Insufficient documentation

## 2017-04-13 DIAGNOSIS — Z79899 Other long term (current) drug therapy: Secondary | ICD-10-CM | POA: Diagnosis not present

## 2017-04-13 DIAGNOSIS — S60211A Contusion of right wrist, initial encounter: Secondary | ICD-10-CM | POA: Insufficient documentation

## 2017-04-13 DIAGNOSIS — W109XXA Fall (on) (from) unspecified stairs and steps, initial encounter: Secondary | ICD-10-CM | POA: Insufficient documentation

## 2017-04-13 DIAGNOSIS — M79641 Pain in right hand: Secondary | ICD-10-CM | POA: Diagnosis not present

## 2017-04-13 DIAGNOSIS — I10 Essential (primary) hypertension: Secondary | ICD-10-CM | POA: Insufficient documentation

## 2017-04-13 DIAGNOSIS — S0993XA Unspecified injury of face, initial encounter: Secondary | ICD-10-CM | POA: Diagnosis not present

## 2017-04-13 DIAGNOSIS — S6991XA Unspecified injury of right wrist, hand and finger(s), initial encounter: Secondary | ICD-10-CM | POA: Diagnosis not present

## 2017-04-13 MED ORDER — MELOXICAM 7.5 MG PO TABS: 7.5000 mg | ORAL_TABLET | Freq: Every day | ORAL | 0 refills | Status: DC

## 2017-04-13 MED ORDER — HYDROCODONE-ACETAMINOPHEN 5-325 MG PO TABS: 1.0000 | ORAL_TABLET | ORAL | 0 refills | Status: DC | PRN

## 2017-04-13 MED ORDER — HYDROCODONE-ACETAMINOPHEN 5-325 MG PO TABS
1.0000 | ORAL_TABLET | Freq: Once | ORAL | Status: AC
  Administered 2017-04-13: 1 via ORAL
  Filled 2017-04-13: qty 1

## 2017-04-13 NOTE — ED Triage Notes (Signed)
Pt comes into the ED via POV c/o a fall where she missed the bottom two steps.  Patient presents with hematoma over the right cheek bone and right wrist pain.  Patient unsure if she had LOC.  Patient denies any chest pain, dizziness, N/V.  Patient in NAD with even and unlabored respirations.  Denies any blood thinners at home.

## 2017-04-13 NOTE — ED Provider Notes (Signed)
[facility] Emergency Department Provider Note  ____________________________________________  Time seen: Approximately 4:39 PM  I have reviewed the triage vital signs and the nursing notes.   HISTORY  Chief Complaint Fall    HPI Sara Huynh is a 67 y.o. female who presents the emergency department status post a fall striking her right hand/wrist and right face.  Patient reports that she was exiting her mother's house, missed 2 steps, falling and striking her face on a concrete carport.  Patient is unsure whether she lost consciousness or not.  Patient is reporting right-sided head, facial pain, right hand and wrist pain.  Patient reports that she has had no subsequent loss of consciousness.  Her primary pain complaint is face and right side of the head.  She reports that it is "not really headache, but not really facial pain" when describing her head pain.  Patient denies any visual changes.  She reports that significant amount of pain to the right cheek region.  She is able to move her jaw appropriately.  She denies any jaw pain.  She denies any neck pain.  Patient endorses right medial hand and wrist pain starting at the distal ulna and radiating to the fifth digit.  No other pain complaints.  No medications prior to arrival.  Past Medical History:  Diagnosis Date  . Anxiety   . GERD (gastroesophageal reflux disease)   . Hyperlipidemia   . Hypertension   . Scleroderma [facility])     Patient Active Problem List   Diagnosis Date Noted  . Cutaneous abscess 07/30/2015  . Abdominal pain 07/22/2015  . Adaptation reaction 07/22/2015  . Atypical chest pain 07/22/2015  . Fatigue 07/22/2015  . Awareness of heartbeats 07/22/2015  . 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 Shingles 12/18/2014  . 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 Small bowel obstruction ([place]) 04/15/2013  . Systemic sclerosis  ([place]) 08/08/2012  . Avitaminosis D 10/30/2009  . Pain in shoulder 04/18/2009  . Hypercholesteremia 08/23/2008  . Abnormal ECG 08/23/2008  . H/O total hysterectomy 08/21/2008  . Can't get food down 08/16/2008  . BP (high blood pressure) 08/16/2008  . 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 Acid reflux 08/16/2008  . Scleroderma ([place]) 04/12/1998    Past Surgical History:  Procedure Laterality Date  . ABDOMINAL HYSTERECTOMY  1986  . APPENDECTOMY    . CHOLECYSTECTOMY  03/2009  . G tube placed for 18 mths   2007  . gi pacemaker  2002  . NASAL SINUS SURGERY  2005  . PYLOROPLASTY  07/15/15    Prior to Admission medications   Medication Sig Start Date End Date Taking? Authorizing Provider  acetaminophen (TYLENOL) 500 MG tablet Take 1,000 mg by mouth every 8 (eight) hours as needed.     [provider]  escitalopram (LEXAPRO) 10 MG tablet Take 1 tablet (10 mg total) by mouth daily. 08/13/16   Birdie Sons, MD  HYDROcodone-acetaminophen (NORCO/VICODIN) 5-325 MG tablet Take 1 tablet by mouth every 4 (four) hours as needed for moderate pain. 04/13/17   Cuthriell, Charline Bills, PA-C  linaclotide (LINZESS) 145 MCG CAPS capsule Take 145 mcg by mouth daily.  04/17/15   [provider]  lisinopril-hydrochlorothiazide (PRINZIDE,ZESTORETIC) 10-12.5 MG tablet Take 1 tablet by mouth daily. 07/22/16   Birdie Sons, MD  meloxicam (MOBIC) 7.5 MG tablet Take 1 tablet (7.5 mg total) by mouth daily. 04/13/17 04/13/18  Cuthriell, Charline Bills, PA-C  Multiple Vitamins-Minerals (MULTIVITAMIN ADULT  PO) Take 1 tablet by mouth daily.    [provider]  omeprazole-sodium bicarbonate (ZEGERID) 40-1100 MG capsule Take 1 capsule by mouth 2 (two) times daily.     [provider]  ondansetron (ZOFRAN) 8 MG tablet Take 1 tablet by mouth as needed.    [provider]  simvastatin (ZOCOR) 20 MG tablet Take 1 tablet (20 mg total) by mouth at bedtime. 07/22/16   Birdie Sons, MD  sucralfate  (CARAFATE) 1 g tablet Take 1 g by mouth 4 (four) times daily.     [provider]  vitamin B-12 (CYANOCOBALAMIN) 1000 MCG tablet Take 1,000 mcg by mouth daily.     [provider]    Allergies Prednisone  Family History  Problem Relation Age of Onset  . CAD Mother   . Heart disease Mother        MI  . Dementia Mother   . Alzheimer's disease Mother   . CVA Father   . COPD Father   . Emphysema Father     Social History Social History   Tobacco Use  . Smoking status: Never Smoker  . Smokeless tobacco: Never Used  Substance Use Topics  . Alcohol use: No  . Drug use: No     Review of Systems  Constitutional: No fever/chills Eyes: No visual changes.  ENT: No upper respiratory complaints. Cardiovascular: no chest pain. Respiratory: no cough. No SOB. Gastrointestinal: No abdominal pain.  No nausea, no vomiting.   Musculoskeletal: Positive for right sided head pain, facial pain, right wrist and hand pain. Skin: For edema and ecchymosis over the right cheek. Neurological: Negative for headaches, focal weakness or numbness. 10-point ROS otherwise negative.  ____________________________________________   PHYSICAL EXAM:  VITAL SIGNS: ED Triage Vitals [04/13/17 1605]  Enc Vitals Group     BP (!) 164/77     Pulse Rate 64     Resp 18     Temp 98.5 F (36.9 C)     Temp Source Oral     SpO2 100 %     Weight 116 lb (52.6 kg)     Height 5' (1.524 m)     Head Circumference      Peak Flow      Pain Score 10     Pain Loc      Pain Edu?      Excl. in [place]?      Constitutional: Alert and oriented. Well appearing and in no acute distress. Eyes: Conjunctivae are normal. PERRL. EOMI. Head: Edema and ecchymosis noted to the right zygoma.  Patient is very tender to palpation along the inferior orbit and right zygomatic region.  No tenderness to palpation over the frontal bones, TMJ, maxilla, mandible.  No tenderness to palpation over the osseous structures of  the skull.  No crepitus noted.  No palpable abnormality.  No battle signs or raccoon eyes.  No serosanguineous fluid drainage from the ears or nares. ENT:      Ears:       Nose: No congestion/rhinnorhea.      Mouth/Throat: Mucous membranes are moist.  Neck: No stridor.  No cervical spine tenderness to palpation.  Cardiovascular: Normal rate, regular rhythm. Normal S1 and S2.  Good peripheral circulation.  Radial pulses intact bilateral upper extremities. Respiratory: Normal respiratory effort without tachypnea or retractions. Lungs CTAB. Good air entry to the bases with no decreased or absent breath sounds. Musculoskeletal: Full range of motion to all extremities. No gross deformities  appreciated. Neurologic:  Normal speech and language. No gross focal neurologic deficits are appreciated.  Cranial nerves II through XII grossly intact.  Negative Romberg's and pronator drift.  DTRs intact all 4 extremities.  Sensation intact all 4 extremities. Skin:  Skin is warm, dry and intact. No rash noted. Psychiatric: Mood and affect are normal. Speech and behavior are normal. Patient exhibits appropriate insight and judgement.   ____________________________________________   LABS (all labs ordered are listed, but only abnormal results are displayed)  Labs Reviewed - No data to display ____________________________________________  EKG   ____________________________________________  RADIOLOGY Diamantina Providence Cuthriell, personally viewed and evaluated these images as part of my medical decision making, as well as reviewing the written report by the radiologist.  Ct Head Wo Contrast  Result Date: 04/13/2017 CLINICAL DATA:  67 y/o  F; fall with maxillofacial blunt trauma. EXAM: CT HEAD WITHOUT CONTRAST CT MAXILLOFACIAL WITHOUT CONTRAST TECHNIQUE: Multidetector CT imaging of the head and maxillofacial structures were performed using the standard protocol without intravenous contrast. Multiplanar CT image  reconstructions of the maxillofacial structures were also generated. COMPARISON:  06/29/2016 CT head FINDINGS: CT HEAD FINDINGS Brain: No evidence of acute infarction, hemorrhage, hydrocephalus, extra-axial collection or mass lesion/mass effect. Vascular: Calcific atherosclerosis of carotid siphons. No hyperdense vessel. Skull: Normal. Negative for fracture or focal lesion. Other: None. CT MAXILLOFACIAL FINDINGS Osseous: No fracture or mandibular dislocation. No destructive process. Orbits: Negative. No traumatic or inflammatory finding. Sinuses: Bilateral ethmoidectomy and maxillary antrostomy. Mucosal thickening of the ethmoid cavity. Soft tissues: Ossific atherosclerosis of carotid siphons. Right cheek soft tissue contusion extending to the lower periorbital soft tissues and over the zygomatic arch with small hematoma. IMPRESSION: 1. Negative CT of the head. 2. Soft tissue contusion and small hematoma of the right cheek. 3. No acute facial or orbital fracture.  No mandibular dislocation. Electronically Signed   By: Kristine Garbe M.D.   On: 04/13/2017 17:16   Dg Hand Complete Right  Result Date: 04/13/2017 CLINICAL DATA:  Right hand and wrist pain after fall. EXAM: Osteopenia.  RIGHT HAND - COMPLETE 3+ VIEW COMPARISON:  None. FINDINGS: There is no evidence of fracture or dislocation. There is no evidence of arthropathy or other focal bone abnormality. Osteopenia. Soft tissues are unremarkable. IMPRESSION: Negative. Electronically Signed   By: Titus Dubin M.D.   On: 04/13/2017 17:00   Ct Maxillofacial Wo Contrast  Result Date: 04/13/2017 CLINICAL DATA:  67 y/o  F; fall with maxillofacial blunt trauma. EXAM: CT HEAD WITHOUT CONTRAST CT MAXILLOFACIAL WITHOUT CONTRAST TECHNIQUE: Multidetector CT imaging of the head and maxillofacial structures were performed using the standard protocol without intravenous contrast. Multiplanar CT image reconstructions of the maxillofacial structures were also  generated. COMPARISON:  06/29/2016 CT head FINDINGS: CT HEAD FINDINGS Brain: No evidence of acute infarction, hemorrhage, hydrocephalus, extra-axial collection or mass lesion/mass effect. Vascular: Calcific atherosclerosis of carotid siphons. No hyperdense vessel. Skull: Normal. Negative for fracture or focal lesion. Other: None. CT MAXILLOFACIAL FINDINGS Osseous: No fracture or mandibular dislocation. No destructive process. Orbits: Negative. No traumatic or inflammatory finding. Sinuses: Bilateral ethmoidectomy and maxillary antrostomy. Mucosal thickening of the ethmoid cavity. Soft tissues: Ossific atherosclerosis of carotid siphons. Right cheek soft tissue contusion extending to the lower periorbital soft tissues and over the zygomatic arch with small hematoma. IMPRESSION: 1. Negative CT of the head. 2. Soft tissue contusion and small hematoma of the right cheek. 3. No acute facial or orbital fracture.  No mandibular dislocation. Electronically Signed  By: Kristine Garbe M.D.   On: 04/13/2017 17:16    ____________________________________________    PROCEDURES  Procedure(s) performed:    .Splint Application Date/Time: 12/15/7094 5:37 PM Performed by: Darletta Moll, PA-C Authorized by: Darletta Moll, PA-C   Consent:    Consent obtained:  Verbal   Consent given by:  Patient   Risks discussed:  Pain and swelling Pre-procedure details:    Sensation:  Normal Procedure details:    Laterality:  Right   Location:  Wrist   Splint type:  Wrist   Supplies:  Prefabricated splint Post-procedure details:    Pain:  Improved   Sensation:  Normal   Patient tolerance of procedure:  Tolerated well, no immediate complications      Medications  HYDROcodone-acetaminophen (NORCO/VICODIN) 5-325 MG per tablet 1 tablet (1 tablet Oral Given 04/13/17 1734)     ____________________________________________   INITIAL IMPRESSION / ASSESSMENT AND PLAN / ED COURSE  Pertinent  labs & imaging results that were available during my care of the patient were reviewed by me and considered in my medical decision making (see chart for details).  Review of the [place] CSRS was performed in accordance of the [place] prior to dispensing any controlled drugs.  Clinical Course as of Apr 13 1742  Wed Apr 13, 2017  1650 Patient presented status post a fall striking the right side of her head and face on concrete flooring.  Unknown LOC.  Exam is mostly reassuring the patient does have significant ecchymosis and edema to the right zygomatic region.  At this time, CT scan of the head and face will be ordered as well as x-rays.  Initial differential includes minor head injury, concussion, skull fracture, head bleed, facial fracture, orbital floor fracture, wrist fracture, hand fracture, contusions of the face and wrist.  [JC]    Clinical Course User Index [JC] Cuthriell, Charline Bills, PA-C    Patient's diagnosis is consistent with all resulting in contusion and hematoma of the face, wrist contusion.  CT scan and x-rays returned without any acute intracranial or osseous abnormality.  Patient is given a Velcro wrist brace for symptom control.  Patient is unable to take steroids and as such will be placed on low-dose anti-inflammatory medication and limited prescription of narcotic for symptom control.  Patient will follow primary care as needed.. Patient is given ED precautions to return to the ED for any worsening or new symptoms.     ____________________________________________  FINAL CLINICAL IMPRESSION(S) / ED DIAGNOSES  Final diagnoses:  Fall, initial encounter  Contusion of face, initial encounter  Contusion of right wrist, initial encounter  Traumatic hematoma of cheek, initial encounter      NEW MEDICATIONS STARTED DURING THIS VISIT:  ED Discharge Orders        Ordered    meloxicam (MOBIC) 7.5 MG tablet  Daily     04/13/17 1743    HYDROcodone-acetaminophen (NORCO/VICODIN)  5-325 MG tablet  Every 4 hours PRN     04/13/17 1743          This chart was dictated using voice recognition software/Dragon. Despite best efforts to proofread, errors can occur which can change the meaning. Any change was purely unintentional.    Darletta Moll, PA-C 04/13/17 [place], [place], MD 04/14/17 [phone]

## 2017-04-25 ENCOUNTER — Other Ambulatory Visit: Payer: Self-pay | Admitting: Family Medicine

## 2017-04-25 MED ORDER — LISINOPRIL-HYDROCHLOROTHIAZIDE 10-12.5 MG PO TABS: 1.0000 | ORAL_TABLET | Freq: Every day | ORAL | 3 refills | Status: DC

## 2017-04-25 MED ORDER — SIMVASTATIN 20 MG PO TABS: 20.0000 mg | ORAL_TABLET | Freq: Every day | ORAL | 3 refills | Status: DC

## 2017-04-25 MED ORDER — ESCITALOPRAM OXALATE 10 MG PO TABS: 10.0000 mg | ORAL_TABLET | Freq: Every day | ORAL | 4 refills | Status: DC

## 2017-04-25 NOTE — Telephone Encounter (Signed)
[facility]ed a refill request for a 90-days supply for the following medications. Thanks CC  lisinopril-hydrochlorothiazide (PRINZIDE,ZESTORETIC) 10-12.5 MG tablet   escitalopram (LEXAPRO) 10 MG tablet   simvastatin (ZOCOR) 20 MG tablet

## 2017-06-20 ENCOUNTER — Ambulatory Visit (INDEPENDENT_AMBULATORY_CARE_PROVIDER_SITE_OTHER): Payer: Medicare Other | Admitting: Family Medicine

## 2017-06-20 ENCOUNTER — Encounter: Payer: Self-pay | Admitting: Family Medicine

## 2017-06-20 VITALS — BP 120/80 | HR 93 | Temp 99.4°F | Resp 18

## 2017-06-20 DIAGNOSIS — J40 Bronchitis, not specified as acute or chronic: Secondary | ICD-10-CM

## 2017-06-20 DIAGNOSIS — R05 Cough: Secondary | ICD-10-CM | POA: Diagnosis not present

## 2017-06-20 DIAGNOSIS — R509 Fever, unspecified: Secondary | ICD-10-CM

## 2017-06-20 LAB — POCT INFLUENZA A/B
INFLUENZA A, POC: NEGATIVE
INFLUENZA B, POC: NEGATIVE

## 2017-06-20 MED ORDER — HYDROCODONE-HOMATROPINE 5-1.5 MG/5ML PO SYRP: 5.0000 mL | ORAL_SOLUTION | Freq: Three times a day (TID) | ORAL | 0 refills | Status: DC | PRN

## 2017-06-20 MED ORDER — AZITHROMYCIN 250 MG PO TABS
ORAL_TABLET | ORAL | 0 refills | Status: AC
Start: 2017-06-20 — End: 2017-06-25

## 2017-06-20 NOTE — Progress Notes (Signed)
Patient: Sara Huynh Female    DOB: [birth year]   67 y.o.   MRN: [id number] Visit Date: 06/20/2017  Today's Provider: Lelon Huh, MD   Chief Complaint  Patient presents with  . Cough    X 3 weeks   Subjective:    Cough  This is a new problem. Episode onset: 3 weeks ago. The problem has been gradually worsening (symptoms worsened 3 days ago). The cough is productive of sputum. Associated symptoms include chills, ear congestion, a fever (possibly low grade), headaches, myalgias, postnasal drip, rhinorrhea and a sore throat. Pertinent negatives include no chest pain, ear pain, nasal congestion, shortness of breath, sweats or wheezing. Treatments tried: Robitussin, Tussinex, Mucinex. The treatment provided mild relief.       Allergies  Allergen Reactions  . Prednisone Itching    Other reaction(s): Other (See Comments) Makes skin crawl and make pt aggitated     Current Outpatient Medications:  .  acetaminophen (TYLENOL) 500 MG tablet, Take 1,000 mg by mouth every 8 (eight) hours as needed. , Disp: , Rfl:  .  escitalopram (LEXAPRO) 10 MG tablet, Take 1 tablet (10 mg total) by mouth daily., Disp: 90 tablet, Rfl: 4 .  HYDROcodone-acetaminophen (NORCO/VICODIN) 5-325 MG tablet, Take 1 tablet by mouth every 4 (four) hours as needed for moderate pain., Disp: 15 tablet, Rfl: 0 .  lisinopril-hydrochlorothiazide (PRINZIDE,ZESTORETIC) 10-12.5 MG tablet, Take 1 tablet by mouth daily., Disp: 90 tablet, Rfl: 3 .  Multiple Vitamins-Minerals (MULTIVITAMIN ADULT PO), Take 1 tablet by mouth daily., Disp: , Rfl:  .  omeprazole-sodium bicarbonate (ZEGERID) 40-1100 MG capsule, Take 1 capsule by mouth 2 (two) times daily. , Disp: , Rfl:  .  ondansetron (ZOFRAN) 8 MG tablet, Take 1 tablet by mouth as needed., Disp: , Rfl:  .  simvastatin (ZOCOR) 20 MG tablet, Take 1 tablet (20 mg total) by mouth at bedtime., Disp: 90 tablet, Rfl: 3 .  sucralfate (CARAFATE) 1 g tablet, Take 1 g by mouth 4  (four) times daily. , Disp: , Rfl:  .  vitamin B-12 (CYANOCOBALAMIN) 1000 MCG tablet, Take 1,000 mcg by mouth daily. , Disp: , Rfl:  .  linaclotide (LINZESS) 145 MCG CAPS capsule, Take 145 mcg by mouth daily. , Disp: , Rfl:  .  meloxicam (MOBIC) 7.5 MG tablet, Take 1 tablet (7.5 mg total) by mouth daily. (Patient not taking: Reported on 06/20/2017), Disp: 30 tablet, Rfl: 0  Review of Systems  Constitutional: Positive for chills, diaphoresis (at night), fatigue and fever (possibly low grade).  HENT: Positive for postnasal drip, rhinorrhea and sore throat. Negative for congestion, ear pain, sinus pressure, sinus pain and sneezing.   Respiratory: Positive for cough and chest tightness. Negative for shortness of breath and wheezing.   Cardiovascular: Negative for chest pain.  Musculoskeletal: Positive for myalgias.  Neurological: Positive for headaches.    Social History   Tobacco Use  . Smoking status: Never Smoker  . Smokeless tobacco: Never Used  Substance Use Topics  . Alcohol use: No   Objective:   BP 120/80 (BP Location: Left Arm, Patient Position: Sitting, Cuff Size: Normal)   Pulse 93   Temp 99.4 F (37.4 C) (Oral)   Resp 18   SpO2 96% Comment: room air    Physical Exam   General Appearance:    Alert, cooperative, no distress  Eyes:    PERRL, conjunctiva/corneas clear, EOM's intact       Lungs:  Occasional expiratory wheezes right lung fields, no rales. respirations unlabored  Heart:    Regular rate and rhythm  Neurologic:   Awake, alert, oriented x 3. No apparent focal neurological           defect.       Results for orders placed or performed in visit on 06/20/17  POCT Influenza A/B  Result Value Ref Range   Influenza A, POC Negative Negative   Influenza B, POC Negative Negative       Assessment & Plan:     1. Cough with fever  - POCT Influenza A/B - HYDROcodone-homatropine (HYCODAN) 5-1.5 MG/5ML syrup; Take 5 mLs by mouth every 8 (eight) hours as needed  for cough.  Dispense: 100 mL; Refill: 0  2. Bronchitis  - azithromycin (ZITHROMAX) 250 MG tablet; 2 by mouth today, then 1 daily for 4 days  Dispense: 6 tablet; Refill: 0       Lelon Huh, MD  [facility]

## 2017-06-20 NOTE — Patient Instructions (Signed)

## 2017-06-27 ENCOUNTER — Ambulatory Visit (INDEPENDENT_AMBULATORY_CARE_PROVIDER_SITE_OTHER): Payer: Medicare Other | Admitting: Family Medicine

## 2017-06-27 ENCOUNTER — Ambulatory Visit
Admission: RE | Admit: 2017-06-27 | Discharge: 2017-06-27 | Disposition: A | Discharge status: Home / Self Care | Payer: Medicare Other | Source: Ambulatory Visit | Attending: Family Medicine | Admitting: Family Medicine

## 2017-06-27 ENCOUNTER — Telehealth: Payer: Self-pay | Admitting: Family Medicine

## 2017-06-27 ENCOUNTER — Encounter: Payer: Self-pay | Admitting: Family Medicine

## 2017-06-27 VITALS — BP 120/80 | HR 57 | Temp 98.8°F | Resp 16

## 2017-06-27 DIAGNOSIS — R918 Other nonspecific abnormal finding of lung field: Secondary | ICD-10-CM | POA: Diagnosis not present

## 2017-06-27 DIAGNOSIS — R05 Cough: Secondary | ICD-10-CM | POA: Insufficient documentation

## 2017-06-27 DIAGNOSIS — J40 Bronchitis, not specified as acute or chronic: Secondary | ICD-10-CM

## 2017-06-27 DIAGNOSIS — R059 Cough, unspecified: Secondary | ICD-10-CM

## 2017-06-27 MED ORDER — LEVOFLOXACIN 750 MG PO TABS: 750.0000 mg | ORAL_TABLET | Freq: Every day | ORAL | 0 refills | Status: AC

## 2017-06-27 MED ORDER — ALBUTEROL SULFATE HFA 108 (90 BASE) MCG/ACT IN AERS: 2.0000 | INHALATION_SPRAY | Freq: Four times a day (QID) | RESPIRATORY_TRACT | 2 refills | Status: DC | PRN

## 2017-06-27 NOTE — Progress Notes (Signed)
Patient: AJIAH MCGLINN Female    DOB: 07-Nov-1950   67 y.o.   MRN: [id number] Visit Date: 06/27/2017  Today's Provider: Lelon Huh, MD   Chief Complaint  Patient presents with  . Cough   Subjective:    Cough  Episode onset: 1 month ago. The problem has been gradually worsening. The cough is productive of sputum. Associated symptoms include chills, a fever, headaches, rhinorrhea, shortness of breath and wheezing. Pertinent negatives include no chest pain.  Patient was seen 1 week ago for bronchitis and was treated with Z pack and Hycodan cough syrup. She had negative flu test at that time. Patient reports good compliance with treatment, but states she still doesn't feel any better. Is worse on left side. Has fever up to 101 since.      Allergies  Allergen Reactions  . Prednisone Itching    Other reaction(s): Other (See Comments) Makes skin crawl and make pt aggitated     Current Outpatient Medications:  .  acetaminophen (TYLENOL) 500 MG tablet, Take 1,000 mg by mouth every 8 (eight) hours as needed. , Disp: , Rfl:  .  escitalopram (LEXAPRO) 10 MG tablet, Take 1 tablet (10 mg total) by mouth daily., Disp: 90 tablet, Rfl: 4 .  HYDROcodone-acetaminophen (NORCO/VICODIN) 5-325 MG tablet, Take 1 tablet by mouth every 4 (four) hours as needed for moderate pain., Disp: 15 tablet, Rfl: 0 .  HYDROcodone-homatropine (HYCODAN) 5-1.5 MG/5ML syrup, Take 5 mLs by mouth every 8 (eight) hours as needed for cough., Disp: 100 mL, Rfl: 0 .  linaclotide (LINZESS) 145 MCG CAPS capsule, Take 145 mcg by mouth daily. , Disp: , Rfl:  .  lisinopril-hydrochlorothiazide (PRINZIDE,ZESTORETIC) 10-12.5 MG tablet, Take 1 tablet by mouth daily., Disp: 90 tablet, Rfl: 3 .  meloxicam (MOBIC) 7.5 MG tablet, Take 1 tablet (7.5 mg total) by mouth daily., Disp: 30 tablet, Rfl: 0 .  Multiple Vitamins-Minerals (MULTIVITAMIN ADULT PO), Take 1 tablet by mouth daily., Disp: , Rfl:  .  omeprazole-sodium bicarbonate  (ZEGERID) 40-1100 MG capsule, Take 1 capsule by mouth 2 (two) times daily. , Disp: , Rfl:  .  ondansetron (ZOFRAN) 8 MG tablet, Take 1 tablet by mouth as needed., Disp: , Rfl:  .  simvastatin (ZOCOR) 20 MG tablet, Take 1 tablet (20 mg total) by mouth at bedtime., Disp: 90 tablet, Rfl: 3 .  sucralfate (CARAFATE) 1 g tablet, Take 1 g by mouth 4 (four) times daily. , Disp: , Rfl:  .  vitamin B-12 (CYANOCOBALAMIN) 1000 MCG tablet, Take 1,000 mcg by mouth daily. , Disp: , Rfl:   Review of Systems  Constitutional: Positive for chills, diaphoresis, fatigue and fever. Negative for appetite change.  HENT: Positive for congestion and rhinorrhea.   Respiratory: Positive for cough (thick white/ yellow colored sputum), chest tightness, shortness of breath and wheezing.   Cardiovascular: Negative for chest pain and palpitations.  Gastrointestinal: Negative for abdominal pain, nausea and vomiting.  Neurological: Positive for headaches. Negative for dizziness and weakness.    Social History   Tobacco Use  . Smoking status: Never Smoker  . Smokeless tobacco: Never Used  Substance Use Topics  . Alcohol use: No   Objective:   BP 120/80 (BP Location: Left Arm, Patient Position: Sitting, Cuff Size: Normal)   Pulse (!) 57   Temp 98.8 F (37.1 C) (Oral)   Resp 16   SpO2 99% Comment: room air There were no vitals filed for this visit.   Physical  Exam  General Appearance:    Alert, cooperative, no distress  HENT:   ENT exam normal, no neck nodes or sinus tenderness and neck without nodes  Eyes:    PERRL, conjunctiva/corneas clear, EOM's intact       Lungs:    Occasional expiratory wheeze, no rales, , respirations unlabored  Heart:    Regular rate and rhythm  Neurologic:   Awake, alert, oriented x 3. No apparent focal neurological           defect.           Assessment & Plan:           Lelon Huh, MD  [facility]

## 2017-06-27 NOTE — Telephone Encounter (Signed)
No pneumonia on Xray, just bronchitis. Have sent new [facility]. Call if not much better when finished.

## 2017-06-27 NOTE — Telephone Encounter (Signed)
Advised patient as below.  

## 2017-07-07 ENCOUNTER — Telehealth: Payer: Self-pay

## 2017-07-07 NOTE — Telephone Encounter (Signed)
-----   Message from Birdie Sons, MD sent at 07/07/2017 12:56 PM EDT ----- Please check with patient to make sure is doing better since finishing antibiotic.  Radiologist saw small area of possible infection in right middle lung and need to repeat xray in 2-3 weeks to make sure it is cleared. Will contact her when it is time to repeat xr.

## 2017-07-07 NOTE — Telephone Encounter (Signed)
Left message to call back  

## 2017-07-07 NOTE — Telephone Encounter (Signed)
Patient advised and states she is feeling better. Patient agrees to have repeat x ray in 2-3 weeks as recommended.

## 2017-08-04 ENCOUNTER — Telehealth: Payer: Self-pay | Admitting: Family Medicine

## 2017-08-04 NOTE — Telephone Encounter (Signed)
Please advise patient it is time to repeat chest XR  To follow up on right middle lobe density that was seen on xr in March.

## 2017-08-04 NOTE — Telephone Encounter (Signed)
Patient was advised. Patient stated she will go get XR tomorrow or the first of next week.

## 2017-08-08 ENCOUNTER — Ambulatory Visit
Admission: RE | Admit: 2017-08-08 | Discharge: 2017-08-08 | Disposition: A | Discharge status: Home / Self Care | Payer: Medicare Other | Source: Ambulatory Visit | Attending: Family Medicine | Admitting: Family Medicine

## 2017-08-08 ENCOUNTER — Other Ambulatory Visit: Payer: Self-pay

## 2017-08-08 DIAGNOSIS — R918 Other nonspecific abnormal finding of lung field: Secondary | ICD-10-CM | POA: Insufficient documentation

## 2017-08-08 DIAGNOSIS — R0989 Other specified symptoms and signs involving the circulatory and respiratory systems: Secondary | ICD-10-CM | POA: Diagnosis not present

## 2017-08-08 NOTE — Progress Notes (Deleted)
Patient is at the [facility] Outpatient imaging department waiting to have this done. Please sign order asap. Thanks.

## 2017-08-10 DIAGNOSIS — M349 Systemic sclerosis, unspecified: Secondary | ICD-10-CM | POA: Diagnosis not present

## 2017-08-10 DIAGNOSIS — K220 Achalasia of cardia: Secondary | ICD-10-CM | POA: Diagnosis not present

## 2017-08-10 DIAGNOSIS — K5900 Constipation, unspecified: Secondary | ICD-10-CM | POA: Diagnosis not present

## 2017-08-10 DIAGNOSIS — Z888 Allergy status to other drugs, medicaments and biological substances status: Secondary | ICD-10-CM | POA: Diagnosis not present

## 2017-08-10 DIAGNOSIS — K3184 Gastroparesis: Secondary | ICD-10-CM | POA: Diagnosis not present

## 2017-08-10 DIAGNOSIS — K313 Pylorospasm, not elsewhere classified: Secondary | ICD-10-CM | POA: Diagnosis not present

## 2017-08-10 DIAGNOSIS — K219 Gastro-esophageal reflux disease without esophagitis: Secondary | ICD-10-CM | POA: Diagnosis not present

## 2017-08-10 DIAGNOSIS — R1310 Dysphagia, unspecified: Secondary | ICD-10-CM | POA: Diagnosis not present

## 2017-08-16 ENCOUNTER — Emergency Department: Payer: Medicare Other

## 2017-08-16 ENCOUNTER — Other Ambulatory Visit: Payer: Self-pay

## 2017-08-16 ENCOUNTER — Emergency Department
Admission: EM | Admit: 2017-08-16 | Discharge: 2017-08-16 | Disposition: A | Discharge status: Home / Self Care | Payer: Medicare Other | Attending: Emergency Medicine | Admitting: Emergency Medicine

## 2017-08-16 DIAGNOSIS — R079 Chest pain, unspecified: Secondary | ICD-10-CM | POA: Diagnosis not present

## 2017-08-16 DIAGNOSIS — R55 Syncope and collapse: Secondary | ICD-10-CM | POA: Diagnosis not present

## 2017-08-16 DIAGNOSIS — I10 Essential (primary) hypertension: Secondary | ICD-10-CM | POA: Diagnosis not present

## 2017-08-16 DIAGNOSIS — R0789 Other chest pain: Secondary | ICD-10-CM

## 2017-08-16 DIAGNOSIS — R42 Dizziness and giddiness: Secondary | ICD-10-CM | POA: Diagnosis not present

## 2017-08-16 LAB — BASIC METABOLIC PANEL
ANION GAP: 10 (ref 5–15)
BUN: 13 mg/dL (ref 6–20)
CHLORIDE: 100 mmol/L — AB (ref 101–111)
CO2: 27 mmol/L (ref 22–32)
Calcium: 9.2 mg/dL (ref 8.9–10.3)
Creatinine, Ser: 0.92 mg/dL (ref 0.44–1.00)
GFR calc Af Amer: >60 mL/min (ref >60)
GFR calc non Af Amer: >60 mL/min (ref >60)
GLUCOSE: 140 mg/dL — AB (ref 65–99)
POTASSIUM: 3.1 mmol/L — AB (ref 3.5–5.1)
Sodium: 137 mmol/L (ref 135–145)

## 2017-08-16 LAB — CBC
HEMATOCRIT: 33.9 % — AB (ref 35.0–47.0)
HEMOGLOBIN: 11.8 g/dL — AB (ref 12.0–16.0)
MCH: 29.5 pg (ref 26.0–34.0)
MCHC: 34.8 g/dL (ref 32.0–36.0)
MCV: 84.7 fL (ref 80.0–100.0)
Platelets: 255 10*3/uL (ref 150–440)
RBC: 4.01 MIL/uL (ref 3.80–5.20)
RDW: 14.9 % — ABNORMAL HIGH (ref 11.5–14.5)
WBC: 9.8 10*3/uL (ref 3.6–11.0)

## 2017-08-16 LAB — TROPONIN I
Troponin I: <0.03 ng/mL (ref <0.03)
Troponin I: <0.03 ng/mL (ref <0.03)

## 2017-08-16 MED ORDER — MECLIZINE HCL 25 MG PO TABS
25.0000 mg | ORAL_TABLET | Freq: Once | ORAL | Status: AC
  Administered 2017-08-16: 25 mg via ORAL
  Filled 2017-08-16: qty 1

## 2017-08-16 MED ORDER — SODIUM CHLORIDE 0.9 % IV BOLUS
1000.0000 mL | Freq: Once | INTRAVENOUS | Status: AC
  Administered 2017-08-16: 1000 mL via INTRAVENOUS

## 2017-08-16 MED ORDER — ONDANSETRON HCL 4 MG/2ML IJ SOLN
4.0000 mg | Freq: Once | INTRAMUSCULAR | Status: AC
Start: 2017-08-16 — End: 2017-08-16
  Administered 2017-08-16: 4 mg via INTRAVENOUS
  Filled 2017-08-16: qty 2

## 2017-08-16 MED ORDER — MECLIZINE HCL 25 MG PO TABS: 25.0000 mg | ORAL_TABLET | Freq: Three times a day (TID) | ORAL | 0 refills | Status: AC | PRN

## 2017-08-16 NOTE — Discharge Instructions (Signed)
Call your primary care doctor tomorrow to arrange for follow-up within the next 1 to 2 weeks.  Return to the ER for new, worsening, persistent dizziness, lightheadedness, chest pain, difficulty breathing, or any other new or worsening symptoms that concern you.

## 2017-08-16 NOTE — ED Triage Notes (Signed)
Pt arrive [facility] from school where she was picking grandkids up. Was sitting in car and felt dizzy and diaphoretic. Went inside to bathroom, felt dizzy, threw up. Started feeling L chest pressure and back pain. Initially 8/10, now 1/10. Still feeling dizzy. Alert, oriented. Arrives with 18G L AC

## 2017-08-16 NOTE — ED Provider Notes (Signed)
[facility] Emergency Department Provider Note ____________________________________________   First MD Initiated Contact with Patient 08/16/17 1617     (approximate)  I have reviewed the triage vital signs and the nursing notes.   HISTORY  Chief Complaint Dizziness and Chest Pain    HPI Sara Huynh is a 67 y.o. female with PMH as noted below who presents with dizziness, acute onset while she was sitting in her car waiting to pick up her grandkids, described as lightheadedness, and feeling like she would pass out.  Patient then went to the bathroom and vomited.  She reported feeling sweaty as well.  She  states that at this time she developed some chest discomfort in the left side of her chest and radiating around to the back, but she does not describe it as pain.  She denies associated shortness of breath.  She does report some element of spinning or movement to the dizziness.  The school nurse checked her blood pressure and it was 70/50.  She reports the chest discomfort is now improved, but she still feels lightheaded and dizzy.   Past Medical History:  Diagnosis Date  . Anxiety   . GERD (gastroesophageal reflux disease)   . Hyperlipidemia   . Hypertension   . Scleroderma [facility])     Patient Active Problem List   Diagnosis Date Noted  . Cutaneous abscess 07/30/2015  . Abdominal pain 07/22/2015  . Adaptation reaction 07/22/2015  . Atypical chest pain 07/22/2015  . Fatigue 07/22/2015  . Awareness of heartbeats 07/22/2015  . 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 Shingles 12/18/2014  . 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 Small bowel obstruction ([place]) 04/15/2013  . Systemic sclerosis ([place]) 08/08/2012  . Avitaminosis D 10/30/2009  . Pain in shoulder 04/18/2009  . Hypercholesteremia 08/23/2008  . Abnormal ECG 08/23/2008  . H/O total hysterectomy 08/21/2008  . Can't get  food down 08/16/2008  . BP (high blood pressure) 08/16/2008  . 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 Acid reflux 08/16/2008  . Scleroderma ([place]) 04/12/1998    Past Surgical History:  Procedure Laterality Date  . ABDOMINAL HYSTERECTOMY  1986  . APPENDECTOMY    . CHOLECYSTECTOMY  03/2009  . G tube placed for 18 mths   2007  . gi pacemaker  2002  . NASAL SINUS SURGERY  2005  . PYLOROPLASTY  07/15/15    Prior to Admission medications   Medication Sig Start Date End Date Taking? Authorizing Provider  acetaminophen (TYLENOL) 500 MG tablet Take 1,000 mg by mouth every 8 (eight) hours as needed.     [provider]  albuterol (PROVENTIL HFA;VENTOLIN HFA) 108 (90 Base) MCG/ACT inhaler Inhale 2 puffs into the lungs every 6 (six) hours as needed for wheezing or shortness of breath. 06/27/17   Birdie Sons, MD  escitalopram (LEXAPRO) 10 MG tablet Take 1 tablet (10 mg total) by mouth daily. 04/25/17   Birdie Sons, MD  HYDROcodone-acetaminophen (NORCO/VICODIN) 5-325 MG tablet Take 1 tablet by mouth every 4 (four) hours as needed for moderate pain. 04/13/17   Cuthriell, Charline Bills, PA-C  HYDROcodone-homatropine (HYCODAN) 5-1.5 MG/5ML syrup Take 5 mLs by mouth every 8 (eight) hours as needed for cough. 06/20/17   Birdie Sons, MD  linaclotide Rolan Lipa) 145 MCG CAPS capsule Take 145 mcg by mouth daily.  04/17/15   [provider]  lisinopril-hydrochlorothiazide (PRINZIDE,ZESTORETIC) 10-12.5 MG tablet Take 1 tablet by mouth daily. 04/25/17   Fisher,  Kirstie Peri, MD  meclizine (ANTIVERT) 25 MG tablet Take 1 tablet (25 mg total) by mouth 3 (three) times daily as needed for up to 5 days for dizziness or nausea. 08/16/17 08/21/17  Arta Silence, MD  meloxicam (MOBIC) 7.5 MG tablet Take 1 tablet (7.5 mg total) by mouth daily. 04/13/17 04/13/18  Cuthriell, Charline Bills, PA-C  Multiple Vitamins-Minerals (MULTIVITAMIN ADULT PO) Take 1 tablet by mouth daily.    [provider]  omeprazole-sodium  bicarbonate (ZEGERID) 40-1100 MG capsule Take 1 capsule by mouth 2 (two) times daily.     [provider]  ondansetron (ZOFRAN) 8 MG tablet Take 1 tablet by mouth as needed.    [provider]  simvastatin (ZOCOR) 20 MG tablet Take 1 tablet (20 mg total) by mouth at bedtime. 04/25/17   Birdie Sons, MD  sucralfate (CARAFATE) 1 g tablet Take 1 g by mouth 4 (four) times daily.     [provider]  vitamin B-12 (CYANOCOBALAMIN) 1000 MCG tablet Take 1,000 mcg by mouth daily.     [provider]    Allergies Prednisone  Family History  Problem Relation Age of Onset  . CAD Mother   . Heart disease Mother        MI  . Dementia Mother   . Alzheimer's disease Mother   . CVA Father   . COPD Father   . Emphysema Father     Social History Social History   Tobacco Use  . Smoking status: Never Smoker  . Smokeless tobacco: Never Used  Substance Use Topics  . Alcohol use: No  . Drug use: No    Review of Systems  Constitutional: No fever. Eyes: No visual changes. ENT: No neck pain. Cardiovascular: Positive for chest discomfort. Respiratory: Denies shortness of breath. Gastrointestinal: Positive for nausea.  Genitourinary: Negative for flank pain.  Musculoskeletal: Negative for back pain. Skin: Negative for rash. Neurological: Negative for headache.  ____________________________________________   PHYSICAL EXAM:  VITAL SIGNS: ED Triage Vitals  Enc Vitals Group     BP 08/16/17 1548 135/76     Pulse Rate 08/16/17 1548 66     Resp 08/16/17 1548 18     Temp 08/16/17 1548 98.5 F (36.9 C)     Temp Source 08/16/17 1548 Oral     SpO2 08/16/17 1548 98 %     Weight 08/16/17 1544 118 lb (53.5 kg)     Height 08/16/17 1544 5' (1.524 m)     Head Circumference --      Peak Flow --      Pain Score 08/16/17 1544 3     Pain Loc --      Pain Edu? --      Excl. in [place]? --     Constitutional: Alert and oriented.  Anxious appearing and in no acute  distress. Eyes: Conjunctivae are normal.  EOMI.  PERRLA. Head: Atraumatic. Nose: No congestion/rhinnorhea. Mouth/Throat: Mucous membranes are dry.   Neck: Normal range of motion.  Cardiovascular: Normal rate, regular rhythm. Grossly normal heart sounds.  Good peripheral circulation. Respiratory: Normal respiratory effort.  No retractions. Lungs CTAB. Gastrointestinal: Soft and nontender. No distention.  Genitourinary: No flank tenderness. Musculoskeletal: No lower extremity edema.  Extremities warm and well perfused.  Neurologic:  Normal speech and language.  Motor intact in all extremities.  Normal coordination.  No gross focal neurologic deficits are appreciated.  Skin:  Skin is warm and dry. No rash noted. Psychiatric: Mood and affect are  normal. Speech and behavior are normal.  ____________________________________________   LABS (all labs ordered are listed, but only abnormal results are displayed)  Labs Reviewed  BASIC METABOLIC PANEL - Abnormal; Notable for the following components:      Result Value   Potassium 3.1 (*)    Chloride 100 (*)    Glucose, Bld 140 (*)    All other components within normal limits  CBC - Abnormal; Notable for the following components:   Hemoglobin 11.8 (*)    HCT 33.9 (*)    RDW 14.9 (*)    All other components within normal limits  TROPONIN I  TROPONIN I   ____________________________________________  EKG  ED ECG REPORT I, Arta Silence, the attending physician, personally viewed and interpreted this ECG.  Date: 08/16/2017 EKG Time: 1725 Rate: 64 Rhythm: normal sinus rhythm QRS Axis: Right axis Intervals: normal ST/T Wave abnormalities: Nonspecific T wave inversions and flattening anteriorly Narrative Interpretation: Minimal nonspecific anterior T wave abnormalities when compared to EKG of 06/30/2016  ____________________________________________  RADIOLOGY  CXR: No focal infiltrate or other acute  findings  ____________________________________________   PROCEDURES  Procedure(s) performed: No  Procedures  Critical Care performed: No ____________________________________________   INITIAL IMPRESSION / ASSESSMENT AND PLAN / ED COURSE  Pertinent labs & imaging results that were available during my care of the patient were reviewed by me and considered in my medical decision making (see chart for details).  67 year old female with PMH as noted above presents with acute onset of lightheadedness and near syncope, with some component of spinning type dizziness and chest pressure.  The chest discomfort is now resolved although the patient still feels dizzy and stated that she felt as if I was moving while talking to her.  On exam, the patient is anxious but otherwise well-appearing, vital signs are normal, and the remainder of the exam is as described above.  Neuro exam is nonfocal.  Overall presentation is most consistent with vasovagal near syncope, with possible component of peripheral vertigo.  I have a lower suspicion for ACS.  Patient's EKG has possible nonspecific changes when compared to EKG from a year ago, but overall the chest discomfort is highly atypical and the likelihood for ACS on this presentation is low.  I do not suspect PE given the lack of DVT symptoms, risk factors for DVT or PE, or tachycardia or hypoxia.  Although the patient did report radiation of the discomfort towards the back, given that it has now mostly resolved and the fact that she has stable vital signs, I do not suspect aortic dissection or other vascular cause.  Plan: Repeat troponin after 3 hours, fluids, and symptomatic treatment for vertigo.  ----------------------------------------- 8:20 PM on 08/16/2017 -----------------------------------------  Lab work-up is unremarkable.  Troponins have been negative x2.  Chest x-ray is also unremarkable.  On reassessment, the patient reports symptoms have  significantly improved.  She still reports a vague feeling of being slightly unwell, but states that she no longer has chest pain and the dizziness and lightheadedness have resolved.  Vital signs remain stable.  She would like to go home.  At this time, there is no evidence of cardiac etiology or other concerning cause of patient's symptoms.  Given the improvement after meclizine, I suspect a strong component of peripheral vertigo, most likely vasovagal near syncope.  I explained the results of the work-up to the patient.  She agrees to follow-up with her primary care doctor.  Return precautions given, and she expresses understanding.  ____________________________________________   FINAL CLINICAL IMPRESSION(S) / ED DIAGNOSES  Final diagnoses:  Near syncope  Atypical chest pain      NEW MEDICATIONS STARTED DURING THIS VISIT:  New Prescriptions   MECLIZINE (ANTIVERT) 25 MG TABLET    Take 1 tablet (25 mg total) by mouth 3 (three) times daily as needed for up to 5 days for dizziness or nausea.     Note:  This document was prepared using Dragon voice recognition software and may include unintentional dictation errors.    Arta Silence, MD 08/16/17 2023

## 2017-08-16 NOTE — ED Notes (Signed)
Pt stating that she was sitting in the parking lot when she became suddenly dizzy. Pt stating she also became diaphoretic. Pt stating that she went into the school where she was parked and almost passed out. Pt stating that she slowly began with a HA too. Pt stating HA to the top of her head. Pt stating nausea. Pt is still currently nauseated and dizzy. Pt in NAD at this time and family is at the bedside. Pt was provided additional blanket. Pt was ill earlier this weekend

## 2017-08-17 ENCOUNTER — Telehealth: Payer: Self-pay | Admitting: Family Medicine

## 2017-08-17 NOTE — Telephone Encounter (Signed)
Patient re-scheduled appt for tomorrow.

## 2017-08-17 NOTE — Telephone Encounter (Signed)
Pt was in the ER yesterday afternoon with vertigo.  She went in because she got dizzy, sweating , pressure in her chest.  They did testing in the ER and everything looked fine.  She was told to follow up with her primary and she has an appt on this Friday.  She wants to know if Dr. Caryn Section will do an iron test to see if her iron was low if that was not done in the ER.  She is feeling really tired and weak today.   Pt's call back is [phone]  Thanks Con Memos

## 2017-08-17 NOTE — Telephone Encounter (Signed)
Please advise 

## 2017-08-17 NOTE — Telephone Encounter (Signed)
Her blood cell count was normal. She should come in tomorrow instead of Friday if she is still feeling tired and weak.

## 2017-08-18 ENCOUNTER — Ambulatory Visit (INDEPENDENT_AMBULATORY_CARE_PROVIDER_SITE_OTHER): Payer: Medicare Other | Admitting: Family Medicine

## 2017-08-18 ENCOUNTER — Encounter: Payer: Self-pay | Admitting: Family Medicine

## 2017-08-18 VITALS — BP 130/80 | HR 61 | Temp 98.5°F | Resp 16 | Wt 120.0 lb

## 2017-08-18 DIAGNOSIS — D649 Anemia, unspecified: Secondary | ICD-10-CM | POA: Diagnosis not present

## 2017-08-18 DIAGNOSIS — R42 Dizziness and giddiness: Secondary | ICD-10-CM | POA: Diagnosis not present

## 2017-08-18 NOTE — Progress Notes (Signed)
Patient: Sara Huynh Female    DOB: 1950-12-21   67 y.o.   MRN: [id number] Visit Date: 08/18/2017  Today's Provider: Lelon Huh, MD   Chief Complaint  Patient presents with  . Follow-up   Subjective:    HPI  Follow up ER visit  Patient was seen in ER for dizziness on 08/16/2017. She was treated for near syncope and chest pain.  She states she was sitting in car line reading a book and became dizzy, nauseated and diaphoretic, followed by vomiting and episode of watery diarrhea, was hypotensive and called EMS to take to ER.  Treatment for this included giving patient a prescription for Meclizine and has been taking three times a day since then.  She reports good compliance with treatment. She reports this condition is Improved. Patient reports that still feels weak, tired and dizzy. He states the chest pressure and back pain has resolved.   Results for orders placed or performed during the hospital encounter of 58/85/02  Basic metabolic panel  Result Value Ref Range   Sodium 137 135 - 145 mmol/L   Potassium 3.1 (L) 3.5 - 5.1 mmol/L   Chloride 100 (L) 101 - 111 mmol/L   CO2 27 22 - 32 mmol/L   Glucose, Bld 140 (H) 65 - 99 mg/dL   BUN 13 6 - 20 mg/dL   Creatinine, Ser 0.92 0.44 - 1.00 mg/dL   Calcium 9.2 8.9 - 10.3 mg/dL   GFR calc non Af Amer >60 >60 mL/min   GFR calc Af Amer >60 >60 mL/min   Anion gap 10 5 - 15  CBC  Result Value Ref Range   WBC 9.8 3.6 - 11.0 K/uL   RBC 4.01 3.80 - 5.20 MIL/uL   Hemoglobin 11.8 (L) 12.0 - 16.0 g/dL   HCT 33.9 (L) 35.0 - 47.0 %   MCV 84.7 80.0 - 100.0 fL   MCH 29.5 26.0 - 34.0 pg   MCHC 34.8 32.0 - 36.0 g/dL   RDW 14.9 (H) 11.5 - 14.5 %   Platelets 255 150 - 440 K/uL  Troponin I  Result Value Ref Range   Troponin I <0.03 <0.03 ng/mL  Troponin I  Result Value Ref Range   Troponin I <0.03 <0.03 ng/mL   Dg Chest 2 View  Result Date: 08/16/2017 CLINICAL DATA:  Chest pain and dizziness EXAM: CHEST - 2 VIEW COMPARISON:   Chest radiograph 08/08/2017 FINDINGS: The heart size and mediastinal contours are within normal limits. Both lungs are clear. The visualized skeletal structures are unremarkable. IMPRESSION: No active cardiopulmonary disease. Electronically Signed   By: Ulyses Jarred M.D.   On: 08/16/2017 16:31   Dg Chest 2 View  Result Date: 08/08/2017 CLINICAL DATA:  Follow-up abnormal chest x-ray. EXAM: CHEST - 2 VIEW COMPARISON:  06/27/2017 FINDINGS: The heart size and mediastinal contours are within normal limits. Both lungs are clear. The visualized skeletal structures are unremarkable. IMPRESSION: No active cardiopulmonary disease. Electronically Signed   By: Kathreen Devoid   On: 08/08/2017 16:01    ------------------------------------------------------------------------------------      Allergies  Allergen Reactions  . Prednisone Itching    Other reaction(s): Other (See Comments) Makes skin crawl and make pt aggitated     Current Outpatient Medications:  .  acetaminophen (TYLENOL) 500 MG tablet, Take 1,000 mg by mouth every 8 (eight) hours as needed. , Disp: , Rfl:  .  albuterol (PROVENTIL HFA;VENTOLIN HFA) 108 (90 Base) MCG/ACT inhaler, Inhale  2 puffs into the lungs every 6 (six) hours as needed for wheezing or shortness of breath., Disp: 1 Inhaler, Rfl: 2 .  escitalopram (LEXAPRO) 10 MG tablet, Take 1 tablet (10 mg total) by mouth daily., Disp: 90 tablet, Rfl: 4 .  HYDROcodone-acetaminophen (NORCO/VICODIN) 5-325 MG tablet, Take 1 tablet by mouth every 4 (four) hours as needed for moderate pain., Disp: 15 tablet, Rfl: 0 .  HYDROcodone-homatropine (HYCODAN) 5-1.5 MG/5ML syrup, Take 5 mLs by mouth every 8 (eight) hours as needed for cough., Disp: 100 mL, Rfl: 0 .  lisinopril-hydrochlorothiazide (PRINZIDE,ZESTORETIC) 10-12.5 MG tablet, Take 1 tablet by mouth daily., Disp: 90 tablet, Rfl: 3 .  meclizine (ANTIVERT) 25 MG tablet, Take 1 tablet (25 mg total) by mouth 3 (three) times daily as needed for up  to 5 days for dizziness or nausea., Disp: 15 tablet, Rfl: 0 .  meloxicam (MOBIC) 7.5 MG tablet, Take 1 tablet (7.5 mg total) by mouth daily., Disp: 30 tablet, Rfl: 0 .  Multiple Vitamins-Minerals (MULTIVITAMIN ADULT PO), Take 1 tablet by mouth daily., Disp: , Rfl:  .  omeprazole-sodium bicarbonate (ZEGERID) 40-1100 MG capsule, Take 1 capsule by mouth 2 (two) times daily. , Disp: , Rfl:  .  ondansetron (ZOFRAN) 8 MG tablet, Take 1 tablet by mouth as needed., Disp: , Rfl:  .  simvastatin (ZOCOR) 20 MG tablet, Take 1 tablet (20 mg total) by mouth at bedtime., Disp: 90 tablet, Rfl: 3 .  sucralfate (CARAFATE) 1 g tablet, Take 1 g by mouth 4 (four) times daily. , Disp: , Rfl:  .  vitamin B-12 (CYANOCOBALAMIN) 1000 MCG tablet, Take 1,000 mcg by mouth daily. , Disp: , Rfl:  .  linaclotide (LINZESS) 145 MCG CAPS capsule, Take 145 mcg by mouth daily. , Disp: , Rfl:   Review of Systems  Constitutional: Positive for fatigue. Negative for appetite change, chills and fever.  Respiratory: Negative for chest tightness and shortness of breath.   Cardiovascular: Negative for chest pain and palpitations.  Gastrointestinal: Negative for abdominal pain, nausea and vomiting.  Neurological: Positive for dizziness and weakness.    Social History   Tobacco Use  . Smoking status: Never Smoker  . Smokeless tobacco: Never Used  Substance Use Topics  . Alcohol use: No   Objective:   BP 130/80 (BP Location: Left Arm, Patient Position: Sitting, Cuff Size: Normal)   Pulse 61   Temp 98.5 F (36.9 C) (Oral)   Resp 16   Wt 120 lb (54.4 kg)   SpO2 94% Comment: room air  BMI 23.44 kg/m     Physical Exam  General Appearance:    Alert, cooperative, no distress  Eyes:    PERRL, conjunctiva/corneas clear, EOM's intact       Lungs:     Clear to auscultation bilaterally, respirations unlabored  Heart:    Regular rate and rhythm  Abdomen:   bowel sounds present and normal in all 4 quadrants, soft, round or  nontender. No CVA tenderness       Assessment & Plan:     1. Dizziness Sudden onset two days ago associated nausea, vomiting and episode of diarrhea. Has improved since then. ER workup unremarkable with except of mild hypokalemia and anemia. Suspect brief episode of food poisoning. Advised to stop schedule meclizine and only take if she has spinning sensation. Call if not improving. Check labs to make sure they are normalizing. She reports she has been drinking a lot of water and gatoaraid.  - Comprehensive metabolic  panel - CBC - Ferritin  2. Anemia, unspecified type She reports she has had to have iron infusions in the past.  - CBC - Ferritin       Lelon Huh, MD  [place]

## 2017-08-19 ENCOUNTER — Inpatient Hospital Stay: Payer: Self-pay | Admitting: Family Medicine

## 2017-08-19 LAB — COMPREHENSIVE METABOLIC PANEL
A/G RATIO: 1.6 (ref 1.2–2.2)
ALBUMIN: 4.1 g/dL (ref 3.6–4.8)
ALT: 14 IU/L (ref 0–32)
AST: 18 IU/L (ref 0–40)
Alkaline Phosphatase: 52 IU/L (ref 39–117)
BILIRUBIN TOTAL: 0.5 mg/dL (ref 0.0–1.2)
BUN/Creatinine Ratio: 11 — ABNORMAL LOW (ref 12–28)
BUN: 10 mg/dL (ref 8–27)
CALCIUM: 9.5 mg/dL (ref 8.7–10.3)
CHLORIDE: 102 mmol/L (ref 96–106)
CO2: 27 mmol/L (ref 20–29)
Creatinine, Ser: 0.95 mg/dL (ref 0.57–1.00)
GFR, EST AFRICAN AMERICAN: 72 mL/min/{1.73_m2} (ref >59)
GFR, EST NON AFRICAN AMERICAN: 63 mL/min/{1.73_m2} (ref >59)
GLOBULIN, TOTAL: 2.5 g/dL (ref 1.5–4.5)
Glucose: 80 mg/dL (ref 65–99)
POTASSIUM: 4.3 mmol/L (ref 3.5–5.2)
SODIUM: 144 mmol/L (ref 134–144)
TOTAL PROTEIN: 6.6 g/dL (ref 6.0–8.5)

## 2017-08-19 LAB — FERRITIN: Ferritin: 32 ng/mL (ref 15–150)

## 2017-08-19 LAB — CBC
HEMOGLOBIN: 11.1 g/dL (ref 11.1–15.9)
Hematocrit: 33.9 % — ABNORMAL LOW (ref 34.0–46.6)
MCH: 28.2 pg (ref 26.6–33.0)
MCHC: 32.7 g/dL (ref 31.5–35.7)
MCV: 86 fL (ref 79–97)
Platelets: 248 10*3/uL (ref 150–379)
RBC: 3.93 x10E6/uL (ref 3.77–5.28)
RDW: 14.8 % (ref 12.3–15.4)
WBC: 9.6 10*3/uL (ref 3.4–10.8)

## 2017-09-16 ENCOUNTER — Ambulatory Visit (INDEPENDENT_AMBULATORY_CARE_PROVIDER_SITE_OTHER): Payer: Medicare Other | Admitting: Family Medicine

## 2017-09-16 ENCOUNTER — Ambulatory Visit (INDEPENDENT_AMBULATORY_CARE_PROVIDER_SITE_OTHER): Payer: Medicare Other

## 2017-09-16 ENCOUNTER — Encounter: Payer: Self-pay | Admitting: Family Medicine

## 2017-09-16 VITALS — BP 100/56 | HR 56 | Temp 98.4°F | Ht 60.0 in | Wt 118.6 lb

## 2017-09-16 DIAGNOSIS — Z0000 Encounter for general adult medical examination without abnormal findings: Secondary | ICD-10-CM | POA: Diagnosis not present

## 2017-09-16 DIAGNOSIS — E559 Vitamin D deficiency, unspecified: Secondary | ICD-10-CM

## 2017-09-16 DIAGNOSIS — I10 Essential (primary) hypertension: Secondary | ICD-10-CM

## 2017-09-16 DIAGNOSIS — E2839 Other primary ovarian failure: Secondary | ICD-10-CM | POA: Diagnosis not present

## 2017-09-16 DIAGNOSIS — M341 CR(E)ST syndrome: Secondary | ICD-10-CM | POA: Diagnosis not present

## 2017-09-16 DIAGNOSIS — E7800 Pure hypercholesterolemia, unspecified: Secondary | ICD-10-CM | POA: Diagnosis not present

## 2017-09-16 MED ORDER — LISINOPRIL 10 MG PO TABS: 10.0000 mg | ORAL_TABLET | Freq: Every day | ORAL | 1 refills | Status: DC

## 2017-09-16 NOTE — Patient Instructions (Signed)
Sara Huynh , Thank you for taking time to come for your Medicare Wellness Visit. I appreciate your ongoing commitment to your health goals. Please review the following plan we discussed and let me know if I can assist you in the future.   Screening recommendations/referrals: Colonoscopy: Per pt scheduled for 2020 Mammogram: Up to date Bone Density: Pt declines today.  Recommended yearly ophthalmology/optometry visit for glaucoma screening and checkup Recommended yearly dental visit for hygiene and checkup  Vaccinations: Influenza vaccine: Up to date Pneumococcal vaccine: Up to date Tdap vaccine: Up to date Shingles vaccine: Pt declines today.     Advanced directives: Please bring a copy of your POA (Power of Attorney) and/or Living Will to your next appointment.   Conditions/risks identified: Recommended way to prepare home inside and out to prevent falls.   Next appointment: 9:20 AM today with Dr Caryn Section.    Preventive Care 8 Years and Older, Female Preventive care refers to lifestyle choices and visits with your health care provider that can promote health and wellness. What does preventive care include?  A yearly physical exam. This is also called an annual well check.  Dental exams once or twice a year.  Routine eye exams. Ask your health care provider how often you should have your eyes checked.  Personal lifestyle choices, including:  Daily care of your teeth and gums.  Regular physical activity.  Eating a healthy diet.  Avoiding tobacco and drug use.  Limiting alcohol use.  Practicing safe sex.  Taking low-dose aspirin every day.  Taking vitamin and mineral supplements as recommended by your health care provider. What happens during an annual well check? The services and screenings done by your health care provider during your annual well check will depend on your age, overall health, lifestyle risk factors, and family history of disease. Counseling  Your  health care provider may ask you questions about your:  Alcohol use.  Tobacco use.  Drug use.  Emotional well-being.  Home and relationship well-being.  Sexual activity.  Eating habits.  History of falls.  Memory and ability to understand (cognition).  Work and work Statistician.  Reproductive health. Screening  You may have the following tests or measurements:  Height, weight, and BMI.  Blood pressure.  Lipid and cholesterol levels. These may be checked every 5 years, or more frequently if you are over 102 years old.  Skin check.  Lung cancer screening. You may have this screening every year starting at age 63 if you have a 30-pack-year history of smoking and currently smoke or have quit within the past 15 years.  Fecal occult blood test (FOBT) of the stool. You may have this test every year starting at age 35.  Flexible sigmoidoscopy or colonoscopy. You may have a sigmoidoscopy every 5 years or a colonoscopy every 10 years starting at age 65.  Hepatitis C blood test.  Hepatitis B blood test.  Sexually transmitted disease (STD) testing.  Diabetes screening. This is done by checking your blood sugar (glucose) after you have not eaten for a while (fasting). You may have this done every 1-3 years.  Bone density scan. This is done to screen for osteoporosis. You may have this done starting at age 29.  Mammogram. This may be done every 1-2 years. Talk to your health care provider about how often you should have regular mammograms. Talk with your health care provider about your test results, treatment options, and if necessary, the need for more tests. Vaccines  Your  health care provider may recommend certain vaccines, such as:  Influenza vaccine. This is recommended every year.  Tetanus, diphtheria, and acellular pertussis (Tdap, Td) vaccine. You may need a Td booster every 10 years.  Zoster vaccine. You may need this after age 40.  Pneumococcal 13-valent  conjugate (PCV13) vaccine. One dose is recommended after age 41.  Pneumococcal polysaccharide (PPSV23) vaccine. One dose is recommended after age 13. Talk to your health care provider about which screenings and vaccines you need and how often you need them. This information is not intended to replace advice given to you by your health care provider. Make sure you discuss any questions you have with your health care provider. Document Released: 04/25/2015 Document Revised: 12/17/2015 Document Reviewed: 01/28/2015 Elsevier Interactive Patient Education  2017 [place] Prevention in the Home Falls can cause injuries. They can happen to people of all ages. There are many things you can do to make your home safe and to help prevent falls. What can I do on the outside of my home?  Regularly fix the edges of walkways and driveways and fix any cracks.  Remove anything that might make you trip as you walk through a door, such as a raised step or threshold.  Trim any bushes or trees on the path to your home.  Use bright outdoor lighting.  Clear any walking paths of anything that might make someone trip, such as rocks or tools.  Regularly check to see if handrails are loose or broken. Make sure that both sides of any steps have handrails.  Any raised decks and porches should have guardrails on the edges.  Have any leaves, snow, or ice cleared regularly.  Use sand or salt on walking paths during winter.  Clean up any spills in your garage right away. This includes oil or grease spills. What can I do in the bathroom?  Use night lights.  Install grab bars by the toilet and in the tub and shower. Do not use towel bars as grab bars.  Use non-skid mats or decals in the tub or shower.  If you need to sit down in the shower, use a plastic, non-slip stool.  Keep the floor dry. Clean up any water that spills on the floor as soon as it happens.  Remove soap buildup in the tub or  shower regularly.  Attach bath mats securely with double-sided non-slip rug tape.  Do not have throw rugs and other things on the floor that can make you trip. What can I do in the bedroom?  Use night lights.  Make sure that you have a light by your bed that is easy to reach.  Do not use any sheets or blankets that are too big for your bed. They should not hang down onto the floor.  Have a firm chair that has side arms. You can use this for support while you get dressed.  Do not have throw rugs and other things on the floor that can make you trip. What can I do in the kitchen?  Clean up any spills right away.  Avoid walking on wet floors.  Keep items that you use a lot in easy-to-reach places.  If you need to reach something above you, use a strong step stool that has a grab bar.  Keep electrical cords out of the way.  Do not use floor polish or wax that makes floors slippery. If you must use wax, use non-skid floor wax.  Do not  have throw rugs and other things on the floor that can make you trip. What can I do with my stairs?  Do not leave any items on the stairs.  Make sure that there are handrails on both sides of the stairs and use them. Fix handrails that are broken or loose. Make sure that handrails are as long as the stairways.  Check any carpeting to make sure that it is firmly attached to the stairs. Fix any carpet that is loose or worn.  Avoid having throw rugs at the top or bottom of the stairs. If you do have throw rugs, attach them to the floor with carpet tape.  Make sure that you have a light switch at the top of the stairs and the bottom of the stairs. If you do not have them, ask someone to add them for you. What else can I do to help prevent falls?  Wear shoes that:  Do not have high heels.  Have rubber bottoms.  Are comfortable and fit you well.  Are closed at the toe. Do not wear sandals.  If you use a stepladder:  Make sure that it is fully  opened. Do not climb a closed stepladder.  Make sure that both sides of the stepladder are locked into place.  Ask someone to hold it for you, if possible.  Clearly mark and make sure that you can see:  Any grab bars or handrails.  First and last steps.  Where the edge of each step is.  Use tools that help you move around (mobility aids) if they are needed. These include:  Canes.  Walkers.  Scooters.  Crutches.  Turn on the lights when you go into a dark area. Replace any light bulbs as soon as they burn out.  Set up your furniture so you have a clear path. Avoid moving your furniture around.  If any of your floors are uneven, fix them.  If there are any pets around you, be aware of where they are.  Review your medicines with your doctor. Some medicines can make you feel dizzy. This can increase your chance of falling. Ask your doctor what other things that you can do to help prevent falls. This information is not intended to replace advice given to you by your health care provider. Make sure you discuss any questions you have with your health care provider. Document Released: 01/23/2009 Document Revised: 09/04/2015 Document Reviewed: 05/03/2014 Elsevier Interactive Patient Education  2017 [employer].

## 2017-09-16 NOTE — Patient Instructions (Signed)
   The CDC recommends two doses of Shingrix (the shingles vaccine) separated by 2 to 6 months for adults age 67 years and older. I recommend checking with your insurance plan regarding coverage for this vaccine.   

## 2017-09-16 NOTE — Progress Notes (Signed)
Patient: Sara Huynh Female    DOB: 10-25-1950   67 y.o.   MRN: [id number] Visit Date: 09/16/2017  Today's Provider: Lelon Huh, MD   Chief Complaint  Patient presents with  . Hypertension  . Hyperlipidemia  . Anemia  . estrogen deficiency   Subjective:   Patient saw McKenzie for AWV today at  8:40 am.  HPI   Hypertension, follow-up:  BP Readings from Last 3 Encounters:  09/16/17 (!) 100/56  08/18/17 130/80  08/16/17 111/65    She was last seen for hypertension 11 months ago.  BP at that visit was 102/64. Management since that visit includes; labs checked, no changes.She reports good compliance with treatment. She is not having side effects.  She is exercising. She is adherent to low salt diet.   Outside blood pressures are checked occasionally. She is experiencing none.  Patient denies exertional chest pressure/discomfort, lower extremity edema and palpitations.   Cardiovascular risk factors include dyslipidemia.    Lipid/Cholesterol, Follow-up:   Last seen for this 11 months ago.  Management since that visit includes; labs checked no changes.  Last Lipid Panel:    Component Value Date/Time   CHOL 200 (H) 10/22/2016 0942   TRIG 227 (H) 10/22/2016 0942   HDL 55 10/22/2016 0942   CHOLHDL 3.6 10/22/2016 0942   LDLCALC 100 (H) 10/22/2016 8127    She reports good compliance with treatment. She is not having side effects.   Wt Readings from Last 3 Encounters:  09/16/17 118 lb 9.6 oz (53.8 kg)  08/18/17 120 lb (54.4 kg)  08/16/17 118 lb (53.5 kg)    She states she continues to feel fatigued, home blood pressures have been consistently. She labs down last month which were mostly normal.   Results for orders placed or performed in visit on 08/18/17  Comprehensive metabolic panel  Result Value Ref Range   Glucose 80 65 - 99 mg/dL   BUN 10 8 - 27 mg/dL   Creatinine, Ser 0.95 0.57 - 1.00 mg/dL   GFR calc non Af Amer 63 >59 mL/min/1.73   GFR calc Af Amer 72 >59 mL/min/1.73   BUN/Creatinine Ratio 11 (L) 12 - 28   Sodium 144 134 - 144 mmol/L   Potassium 4.3 3.5 - 5.2 mmol/L   Chloride 102 96 - 106 mmol/L   CO2 27 20 - 29 mmol/L   Calcium 9.5 8.7 - 10.3 mg/dL   Total Protein 6.6 6.0 - 8.5 g/dL   Albumin 4.1 3.6 - 4.8 g/dL   Globulin, Total 2.5 1.5 - 4.5 g/dL   Albumin/Globulin Ratio 1.6 1.2 - 2.2   Bilirubin Total 0.5 0.0 - 1.2 mg/dL   Alkaline Phosphatase 52 39 - 117 IU/L   AST 18 0 - 40 IU/L   ALT 14 0 - 32 IU/L  CBC  Result Value Ref Range   WBC 9.6 3.4 - 10.8 x10E3/uL   RBC 3.93 3.77 - 5.28 x10E6/uL   Hemoglobin 11.1 11.1 - 15.9 g/dL   Hematocrit 33.9 (L) 34.0 - 46.6 %   MCV 86 79 - 97 fL   MCH 28.2 26.6 - 33.0 pg   MCHC 32.7 31.5 - 35.7 g/dL   RDW 14.8 12.3 - 15.4 %   Platelets 248 150 - 379 x10E3/uL  Ferritin  Result Value Ref Range   Ferritin 32 15 - 150 ng/mL    She also has history of low vitamin d, but not currently on supplements.  Lab Results  Component Value Date   VD25OH 36.0 08/21/2015      Allergies  Allergen Reactions  . Dicyclomine Other (See Comments)    Double vision  . Prednisone Itching, Other (See Comments) and Rash    Other reaction(s): Other (See Comments) Makes skin crawl and make pt aggitated Makes skin crawl and make pt aggitated Makes skin crawl and make pt aggitated      Current Outpatient Medications:  .  acetaminophen (TYLENOL) 500 MG tablet, Take 1,000 mg by mouth every 8 (eight) hours as needed. , Disp: , Rfl:  .  albuterol (PROVENTIL HFA;VENTOLIN HFA) 108 (90 Base) MCG/ACT inhaler, Inhale 2 puffs into the lungs every 6 (six) hours as needed for wheezing or shortness of breath. (Patient not taking: Reported on 09/16/2017), Disp: 1 Inhaler, Rfl: 2 .  escitalopram (LEXAPRO) 10 MG tablet, Take 1 tablet (10 mg total) by mouth daily., Disp: 90 tablet, Rfl: 4 .  HYDROcodone-acetaminophen (NORCO/VICODIN) 5-325 MG tablet, Take 1 tablet by mouth every 4 (four) hours as  needed for moderate pain. (Patient not taking: Reported on 09/16/2017), Disp: 15 tablet, Rfl: 0 .  HYDROcodone-homatropine (HYCODAN) 5-1.5 MG/5ML syrup, Take 5 mLs by mouth every 8 (eight) hours as needed for cough. (Patient not taking: Reported on 09/16/2017), Disp: 100 mL, Rfl: 0 .  linaclotide (LINZESS) 145 MCG CAPS capsule, Take 145 mcg by mouth daily. , Disp: , Rfl:  .  lisinopril-hydrochlorothiazide (PRINZIDE,ZESTORETIC) 10-12.5 MG tablet, Take 1 tablet by mouth daily., Disp: 90 tablet, Rfl: 3 .  meloxicam (MOBIC) 7.5 MG tablet, Take 1 tablet (7.5 mg total) by mouth daily. (Patient not taking: Reported on 09/16/2017), Disp: 30 tablet, Rfl: 0 .  Multiple Vitamins-Minerals (MULTIVITAMIN ADULT PO), Take 1 tablet by mouth daily., Disp: , Rfl:  .  omeprazole-sodium bicarbonate (ZEGERID) 40-1100 MG capsule, Take 1 capsule by mouth 2 (two) times daily. , Disp: , Rfl:  .  ondansetron (ZOFRAN) 8 MG tablet, Take 1 tablet by mouth as needed., Disp: , Rfl:  .  polyethylene glycol (MIRALAX / GLYCOLAX) packet, Take 17 g by mouth daily as needed., Disp: , Rfl:  .  simvastatin (ZOCOR) 20 MG tablet, Take 1 tablet (20 mg total) by mouth at bedtime., Disp: 90 tablet, Rfl: 3 .  sucralfate (CARAFATE) 1 g tablet, Take 1 g by mouth 4 (four) times daily. , Disp: , Rfl:  .  vitamin B-12 (CYANOCOBALAMIN) 1000 MCG tablet, Take 1,000 mcg by mouth daily. , Disp: , Rfl:   Review of Systems  Constitutional: Negative.   HENT: Negative.   Eyes: Negative.   Respiratory: Positive for cough.   Cardiovascular: Negative.   Gastrointestinal: Negative.   Endocrine: Negative.   Genitourinary: Negative.   Musculoskeletal: Negative.   Skin: Negative.   Allergic/Immunologic: Positive for environmental allergies.  Neurological: Negative.   Hematological: Negative.   Psychiatric/Behavioral: Negative.     Social History   Tobacco Use  . Smoking status: Never Smoker  . Smokeless tobacco: Never Used  Substance Use Topics  .  Alcohol use: No   Objective:    BP (!) 100/56 (BP Location: Right Arm)   Pulse (!) 56   Temp 98.4 F (36.9 C) (Oral)   Ht 5' (1.524 m)    Wt 118 lb 9.6 oz (53.8 kg)   BMI 23.16 kg/m   Body mass index is 23.16 kg/m.   Physical Exam   General Appearance:    Alert, cooperative, no distress  Eyes:    PERRL,  conjunctiva/corneas clear, EOM's intact       Lungs:     Clear to auscultation bilaterally, respirations unlabored  Heart:    Regular rate and rhythm  Neurologic:   Awake, alert, oriented x 3. No apparent focal neurological           defect.          Assessment & Plan:     1. Essential hypertension BP has been consistently low and she has been fatigued. Will change lisinopril-hctz to lisinopril and she is to call if she sees BP into the 140s or higher.   2. 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 Derrill Kay  3. Avitaminosis D  - VITAMIN D 25 Hydroxy (Vit-D Deficiency, Fractures)  4. Estrogen deficiency Never had BMD - DG Bone Density; Future  5. Hypercholesteremia She is tolerating simvastatin well with no adverse effects.   - Lipid panel       Lelon Huh, MD  [facility]

## 2017-09-16 NOTE — Progress Notes (Signed)
Subjective:   Sara Huynh is a 67 y.o. female who presents for Medicare Annual (Subsequent) preventive examination.  Review of Systems:  N/A  Cardiac Risk Factors include: advanced age (>33men, >26 women);hypertension     Objective:     Vitals: BP (!) 100/56 (BP Location: Right Arm)   Pulse (!) 56   Temp 98.4 F (36.9 C) (Oral)   Ht 5' (1.524 m)   Wt 118 lb 9.6 oz (53.8 kg)   BMI 23.16 kg/m   Body mass index is 23.16 kg/m.  Advanced Directives 09/16/2017 04/13/2017 08/25/2016 08/21/2015  Does Patient Have a Medical Advance Directive? Yes No Yes Yes  Type of Advance Directive Healthcare Power of [place]  Does patient want to make changes to medical advance directive? - - Yes (ED - Information included in AVS) -  Copy of [place] in Chart? No - copy requested - No - copy requested -  Would patient like information on creating a medical advance directive? - No - Patient declined - -    Tobacco Social History   Tobacco Use  Smoking Status Never Smoker  Smokeless Tobacco Never Used     Counseling given: Not Answered   Clinical Intake:  Pre-visit preparation completed: Yes  Pain : No/denies pain Pain Score: 0-No pain     Nutritional Status: BMI of 19-24  Normal Nutritional Risks: None Diabetes: No  How often do you need to have someone help you when you read instructions, pamphlets, or other written materials from your doctor or pharmacy?: 1 - Never  Interpreter Needed?: No  Information entered by :: [facility], LPN  Past Medical History:  Diagnosis Date  . Anxiety   . GERD (gastroesophageal reflux disease)   . Hyperlipidemia   . Hypertension   . Scleroderma [facility])    Past Surgical History:  Procedure Laterality Date  . ABDOMINAL HYSTERECTOMY  1986  . APPENDECTOMY    . CHOLECYSTECTOMY  03/2009  . G tube placed for 18 mths   2007  . gi pacemaker  2002  . NASAL SINUS  SURGERY  2005  . PYLOROPLASTY  07/15/15   Family History  Problem Relation Age of Onset  . CAD Mother   . Heart disease Mother        MI  . Dementia Mother   . Alzheimer's disease Mother   . CVA Father   . COPD Father   . Emphysema Father    Social History   Socioeconomic History  . Marital status: Married    Spouse name: Not on file  . Number of children: 3  . Years of education: Not on file  . Highest education level: Some college, no degree  Occupational History  . Occupation: retired  Scientific laboratory technician  . Financial resource strain: Not hard at all  . Food insecurity:    Worry: Never true    Inability: Never true  . Transportation needs:    Medical: No    Non-medical: No  Tobacco Use  . Smoking status: Never Smoker  . Smokeless tobacco: Never Used  Substance and Sexual Activity  . Alcohol use: No  . Drug use: No  . Sexual activity: Not on file  Lifestyle  . Physical activity:    Days per week: Not on file    Minutes per session: Not on file  . Stress: Only a little  Relationships  . Social connections:    Talks  on phone: Not on file    Gets together: Not on file    Attends religious service: Not on file    Active member of club or organization: Not on file    Attends meetings of clubs or organizations: Not on file    Relationship status: Not on file  Other Topics Concern  . Not on file  Social History Narrative  . Not on file    Outpatient Encounter Medications as of 09/16/2017  Medication Sig  . acetaminophen (TYLENOL) 500 MG tablet Take 1,000 mg by mouth every 8 (eight) hours as needed.   Marland Kitchen escitalopram (LEXAPRO) 10 MG tablet Take 1 tablet (10 mg total) by mouth daily.  Marland Kitchen lisinopril-hydrochlorothiazide (PRINZIDE,ZESTORETIC) 10-12.5 MG tablet Take 1 tablet by mouth daily.  . Multiple Vitamins-Minerals (MULTIVITAMIN ADULT PO) Take 1 tablet by mouth daily.  Marland Kitchen omeprazole-sodium bicarbonate (ZEGERID) 40-1100 MG capsule Take 1 capsule by mouth 2 (two) times  daily.   . ondansetron (ZOFRAN) 8 MG tablet Take 1 tablet by mouth as needed.  . polyethylene glycol (MIRALAX / GLYCOLAX) packet Take 17 g by mouth daily as needed.  . simvastatin (ZOCOR) 20 MG tablet Take 1 tablet (20 mg total) by mouth at bedtime.  . sucralfate (CARAFATE) 1 g tablet Take 1 g by mouth 4 (four) times daily.   . vitamin B-12 (CYANOCOBALAMIN) 1000 MCG tablet Take 1,000 mcg by mouth daily.   Marland Kitchen albuterol (PROVENTIL HFA;VENTOLIN HFA) 108 (90 Base) MCG/ACT inhaler Inhale 2 puffs into the lungs every 6 (six) hours as needed for wheezing or shortness of breath. (Patient not taking: Reported on 09/16/2017)  . HYDROcodone-acetaminophen (NORCO/VICODIN) 5-325 MG tablet Take 1 tablet by mouth every 4 (four) hours as needed for moderate pain. (Patient not taking: Reported on 09/16/2017)  . HYDROcodone-homatropine (HYCODAN) 5-1.5 MG/5ML syrup Take 5 mLs by mouth every 8 (eight) hours as needed for cough. (Patient not taking: Reported on 09/16/2017)  . linaclotide (LINZESS) 145 MCG CAPS capsule Take 145 mcg by mouth daily.   . meloxicam (MOBIC) 7.5 MG tablet Take 1 tablet (7.5 mg total) by mouth daily. (Patient not taking: Reported on 09/16/2017)   No facility-administered encounter medications on file as of 09/16/2017.     Activities of Daily Living In your present state of health, do you have any difficulty performing the following activities: 09/16/2017  Hearing? N  Vision? N  Difficulty concentrating or making decisions? N  Walking or climbing stairs? N  Dressing or bathing? N  Doing errands, shopping? N  Preparing Food and eating ? N  Using the Toilet? N  In the past six months, have you accidently leaked urine? N  Do you have problems with loss of bowel control? N  Managing your Medications? N  Managing your Finances? N  Housekeeping or managing your Housekeeping? N  Some recent data might be hidden    Patient Care Team: Birdie Sons, MD as PCP - General (Family  Medicine) Leandrew Koyanagi, MD as Referring Physician (Ophthalmology) Isaias Cowman, MD as Consulting Physician (Cardiology) Scherrie November, MD as Referring Physician (Internal Medicine)    Assessment:   This is a routine wellness examination for [name].  Exercise Activities and Dietary recommendations Current Exercise Habits: Home exercise routine, Type of exercise: walking, Time (Minutes): 30, Frequency (Times/Week): 5, Weekly Exercise (Minutes/Week): 150, Intensity: Mild  Goals    . LIFESTYLE - DECREASE FALLS RISK     Recommended way to prepare home inside and out to prevent falls.  Fall Risk Fall Risk  09/16/2017 08/25/2016 08/21/2015  Falls in the past year? Yes No No  Number falls in past yr: 1 - -  Injury with Fall? No - -  Follow up Falls prevention discussed - -   Is the patient's home free of loose throw rugs in walkways, pet beds, electrical cords, etc?   yes      Grab bars in the bathroom? no      Handrails on the stairs?   yes      Adequate lighting?   yes  Timed Get Up and Go performed: N/A  Depression Screen PHQ 2/9 Scores 09/16/2017 08/25/2016 08/25/2016 08/21/2015  PHQ - 2 Score 0 0 0 0  PHQ- 9 Score - 0 - -     Cognitive Function: Pt declined screening today.      6CIT Screen 08/25/2016  What Year? 0 points  What month? 0 points  What time? 0 points  Count back from 20 0 points  Months in reverse 0 points  Repeat phrase 4 points  Total Score 4    Immunization History  Administered Date(s) Administered  . Influenza, High Dose Seasonal PF 01/03/2016, 01/08/2017  . Influenza,inj,Quad PF,6+ Mos 01/11/2015  . Pneumococcal Conjugate-13 01/03/2016  . Pneumococcal Polysaccharide-23 01/08/2017  . Td 10/05/2010  . Tdap 10/05/2010    Qualifies for Shingles Vaccine? Due for Shingles vaccine. Declined my offer to administer today. Education has been provided regarding the importance of this vaccine. Pt has been advised to call her insurance  company to determine her out of pocket expense. Advised she may also receive this vaccine at her local pharmacy or Health Dept. Verbalized acceptance and understanding.  Screening Tests Health Maintenance  Topic Date Due  . DEXA SCAN  11/29/2015  . COLONOSCOPY  07/13/2017  . MAMMOGRAM  10/29/2017  . INFLUENZA VACCINE  11/10/2017  . TETANUS/TDAP  10/04/2020  . Hepatitis C Screening  Completed  . PNA vac Low Risk Adult  Completed    Cancer Screenings: Lung: Low Dose CT Chest recommended if Age 1-80 years, 30 pack-year currently smoking OR have quit w/in 15years. Patient does not qualify. Breast:  Up to date on Mammogram? Yes   Up to date of Bone Density/Dexa? No, pt declines setting this up at this time.  Colorectal: Pt plans to have this completed in 2020.  Additional Screenings:  Hepatitis C Screening: Up to date     Plan:  I have personally reviewed and addressed the Medicare Annual Wellness questionnaire and have noted the following in the patient's chart:  A. Medical and social history B. Use of alcohol, tobacco or illicit drugs  C. Current medications and supplements D. Functional ability and status E.  Nutritional status F.  Physical activity G. Advance directives H. List of other physicians I.  Hospitalizations, surgeries, and ER visits in previous 12 months J.  [place] such as hearing and vision if needed, cognitive and depression L. Referrals and appointments - none  In addition, I have reviewed and discussed with patient certain preventive protocols, quality metrics, and best practice recommendations. A written personalized care plan for preventive services as well as general preventive health recommendations were provided to patient.  See attached scanned questionnaire for additional information.   Signed,  Fabio Neighbors, LPN Nurse Health Advisor   Nurse Recommendations: Pt states she is scheduled for a colonoscopy in 2020 (ok per GI) and pt  declined the DEXA scan order.

## 2017-09-17 LAB — LIPID PANEL
CHOL/HDL RATIO: 3.7 ratio (ref 0.0–4.4)
Cholesterol, Total: 213 mg/dL — ABNORMAL HIGH (ref 100–199)
HDL: 58 mg/dL (ref >39)
LDL CALC: 95 mg/dL (ref 0–99)
TRIGLYCERIDES: 299 mg/dL — AB (ref 0–149)
VLDL CHOLESTEROL CAL: 60 mg/dL — AB (ref 5–40)

## 2017-09-17 LAB — VITAMIN D 25 HYDROXY (VIT D DEFICIENCY, FRACTURES): VIT D 25 HYDROXY: 40.6 ng/mL (ref 30.0–100.0)

## 2017-09-19 ENCOUNTER — Telehealth: Payer: Self-pay

## 2017-09-19 NOTE — Telephone Encounter (Signed)
Pt advised.   Thanks,   -Adison Reifsteck  

## 2017-09-19 NOTE — Telephone Encounter (Signed)
-----   Message from Birdie Sons, MD sent at 09/19/2017  7:57 AM EDT ----- Cholesterol is a little high at 213. Vitamin d levels are good. Continue current dose of simvastatin. Check labs yearly.

## 2017-11-02 ENCOUNTER — Ambulatory Visit
Admission: RE | Admit: 2017-11-02 | Discharge: 2017-11-02 | Disposition: A | Discharge status: Home / Self Care | Payer: Medicare Other | Source: Ambulatory Visit | Attending: Family Medicine | Admitting: Family Medicine

## 2017-11-02 ENCOUNTER — Telehealth: Payer: Self-pay | Admitting: Family Medicine

## 2017-11-02 ENCOUNTER — Telehealth: Payer: Self-pay | Admitting: *Deleted

## 2017-11-02 ENCOUNTER — Encounter: Payer: Self-pay | Admitting: Family Medicine

## 2017-11-02 DIAGNOSIS — M85832 Other specified disorders of bone density and structure, left forearm: Secondary | ICD-10-CM | POA: Diagnosis not present

## 2017-11-02 DIAGNOSIS — M810 Age-related osteoporosis without current pathological fracture: Secondary | ICD-10-CM | POA: Insufficient documentation

## 2017-11-02 DIAGNOSIS — E2839 Other primary ovarian failure: Secondary | ICD-10-CM | POA: Insufficient documentation

## 2017-11-02 MED ORDER — ALENDRONATE SODIUM 70 MG PO TABS: 70.0000 mg | ORAL_TABLET | ORAL | 2 refills | Status: DC

## 2017-11-02 NOTE — Telephone Encounter (Signed)
Pt is requesting call back to discuss the new Rx for alendronate (FOSAMAX) 70 MG tablet. Pt stated she has questions about the side effects. Thanks TNP

## 2017-11-02 NOTE — Telephone Encounter (Signed)
-----   Message from Birdie Sons, MD sent at 11/02/2017 11:57 AM EDT ----- Patient has osteoporosis. Need to start alendronate 70mg  once a week, #12, rf x 2. Need to schedule follow up in 3-4 months. Call if any problems or side effects from medication.

## 2017-11-02 NOTE — Telephone Encounter (Signed)
Patient was notified of results. Expressed understanding. Rx sent to pharmacy. 

## 2017-11-03 NOTE — Telephone Encounter (Signed)
LMTCB

## 2017-11-09 NOTE — Telephone Encounter (Signed)
LMOVM for pt to return call 

## 2017-11-10 NOTE — Telephone Encounter (Signed)
Patient stated she had questions concerning side effects for Fosamax. However she did talk to her pharmacist about them. Patient  wanted Dr. Caryn Section to be aware she has taken her 2nd dose. Patient reports she has had mild headaches nightly and increased acid reflux since starting Fosamax. Patient also wanted to know if there is any possible contradictions with Fosamax and her Scleroderma dx? Please advise?

## 2017-11-11 NOTE — Telephone Encounter (Signed)
Patient advised. She verbalized understanding.  

## 2017-11-11 NOTE — Telephone Encounter (Signed)
If she is having more reflux then should stop Alendronate. Other options for treatment are all injectable medications. Consider her osteoporosis is mild, recommend we monitor bone density every two years.  Start doing some weight bearing exercises at least three days a week. If bone density gets worse in the future will consider referral for Prolia injections.

## 2017-11-15 DIAGNOSIS — L820 Inflamed seborrheic keratosis: Secondary | ICD-10-CM | POA: Diagnosis not present

## 2017-11-15 DIAGNOSIS — K130 Diseases of lips: Secondary | ICD-10-CM | POA: Diagnosis not present

## 2017-11-15 DIAGNOSIS — L940 Localized scleroderma [morphea]: Secondary | ICD-10-CM | POA: Diagnosis not present

## 2017-11-15 DIAGNOSIS — L853 Xerosis cutis: Secondary | ICD-10-CM | POA: Diagnosis not present

## 2017-11-15 DIAGNOSIS — L812 Freckles: Secondary | ICD-10-CM | POA: Diagnosis not present

## 2017-12-07 DIAGNOSIS — M7661 Achilles tendinitis, right leg: Secondary | ICD-10-CM | POA: Diagnosis not present

## 2017-12-29 DIAGNOSIS — R112 Nausea with vomiting, unspecified: Secondary | ICD-10-CM | POA: Diagnosis not present

## 2017-12-29 DIAGNOSIS — K313 Pylorospasm, not elsewhere classified: Secondary | ICD-10-CM | POA: Diagnosis not present

## 2017-12-29 DIAGNOSIS — K3184 Gastroparesis: Secondary | ICD-10-CM | POA: Diagnosis not present

## 2017-12-29 DIAGNOSIS — R12 Heartburn: Secondary | ICD-10-CM | POA: Diagnosis not present

## 2017-12-29 DIAGNOSIS — R1310 Dysphagia, unspecified: Secondary | ICD-10-CM | POA: Diagnosis not present

## 2017-12-29 DIAGNOSIS — K449 Diaphragmatic hernia without obstruction or gangrene: Secondary | ICD-10-CM | POA: Diagnosis not present

## 2018-01-28 ENCOUNTER — Ambulatory Visit (INDEPENDENT_AMBULATORY_CARE_PROVIDER_SITE_OTHER): Payer: Medicare Other

## 2018-01-28 DIAGNOSIS — Z23 Encounter for immunization: Secondary | ICD-10-CM

## 2018-01-31 DIAGNOSIS — H5000 Unspecified esotropia: Secondary | ICD-10-CM | POA: Diagnosis not present

## 2018-03-03 ENCOUNTER — Ambulatory Visit: Payer: Self-pay | Admitting: Family Medicine

## 2018-03-08 ENCOUNTER — Other Ambulatory Visit: Payer: Self-pay | Admitting: Family Medicine

## 2018-03-08 DIAGNOSIS — I10 Essential (primary) hypertension: Secondary | ICD-10-CM

## 2018-03-30 DIAGNOSIS — R112 Nausea with vomiting, unspecified: Secondary | ICD-10-CM | POA: Diagnosis not present

## 2018-03-30 DIAGNOSIS — R110 Nausea: Secondary | ICD-10-CM | POA: Diagnosis not present

## 2018-03-30 DIAGNOSIS — I10 Essential (primary) hypertension: Secondary | ICD-10-CM | POA: Diagnosis not present

## 2018-05-05 ENCOUNTER — Telehealth: Payer: Self-pay | Admitting: Family Medicine

## 2018-05-05 NOTE — Telephone Encounter (Signed)
Sara Huynh [phone]  [name] dropped off her [place] Disability Parking Placard to be filled out- Call when ready to be picked up.

## 2018-05-20 ENCOUNTER — Other Ambulatory Visit: Payer: Self-pay | Admitting: Family Medicine

## 2018-06-21 DIAGNOSIS — K219 Gastro-esophageal reflux disease without esophagitis: Secondary | ICD-10-CM | POA: Diagnosis not present

## 2018-06-21 DIAGNOSIS — Z9889 Other specified postprocedural states: Secondary | ICD-10-CM | POA: Diagnosis not present

## 2018-06-21 DIAGNOSIS — Z6379 Other stressful life events affecting family and household: Secondary | ICD-10-CM | POA: Diagnosis not present

## 2018-06-21 DIAGNOSIS — R110 Nausea: Secondary | ICD-10-CM | POA: Diagnosis not present

## 2018-06-21 DIAGNOSIS — K3184 Gastroparesis: Secondary | ICD-10-CM | POA: Diagnosis not present

## 2018-07-05 ENCOUNTER — Other Ambulatory Visit: Payer: Self-pay | Admitting: Family Medicine

## 2018-07-11 IMAGING — CR DG CHEST 2V
2 series · 2 of 2 positions shown · non-contrast
Comparison: 07/16/2012 .

CLINICAL DATA: Hypertension.

EXAM:
CHEST  2 VIEW

[chest pa]
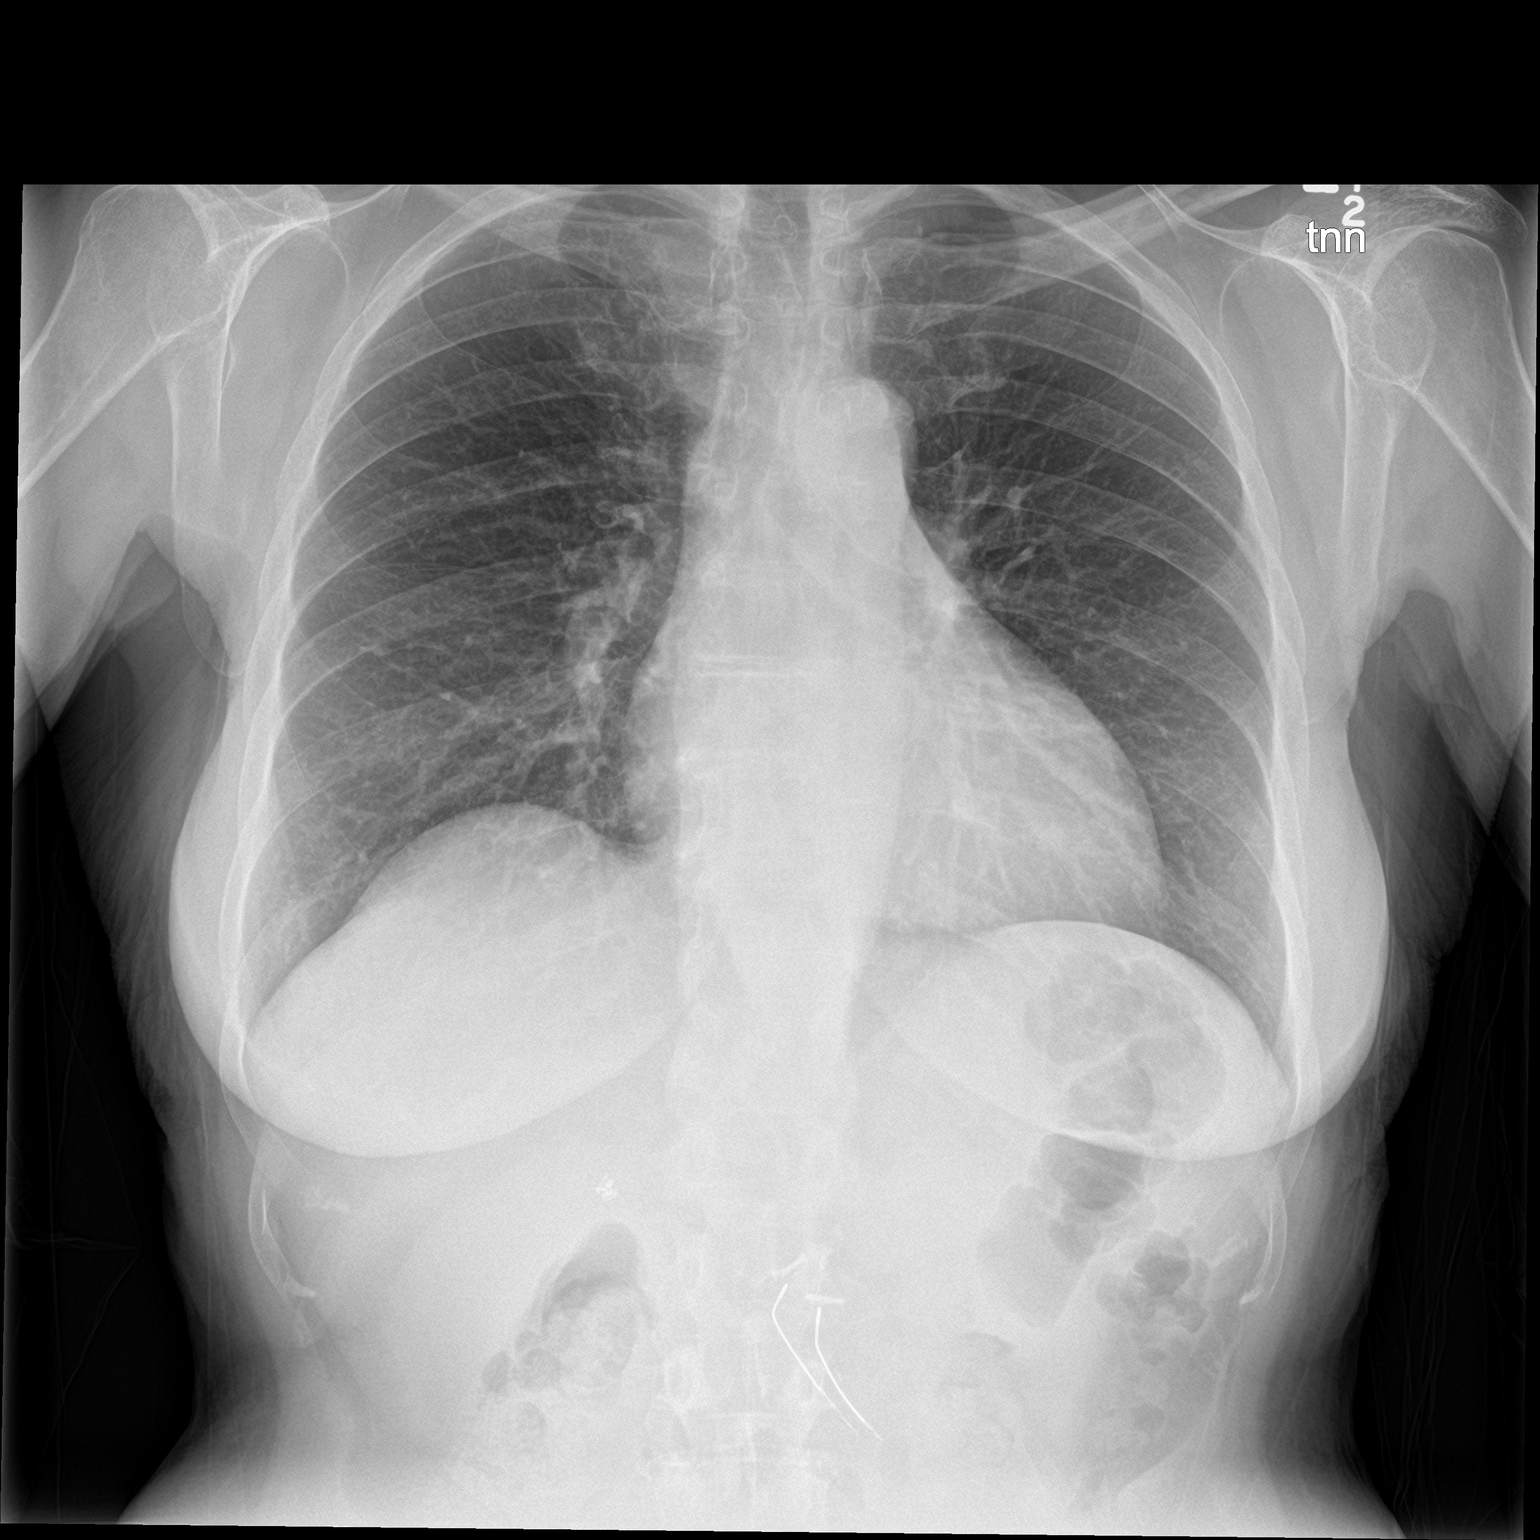

[chest lat]
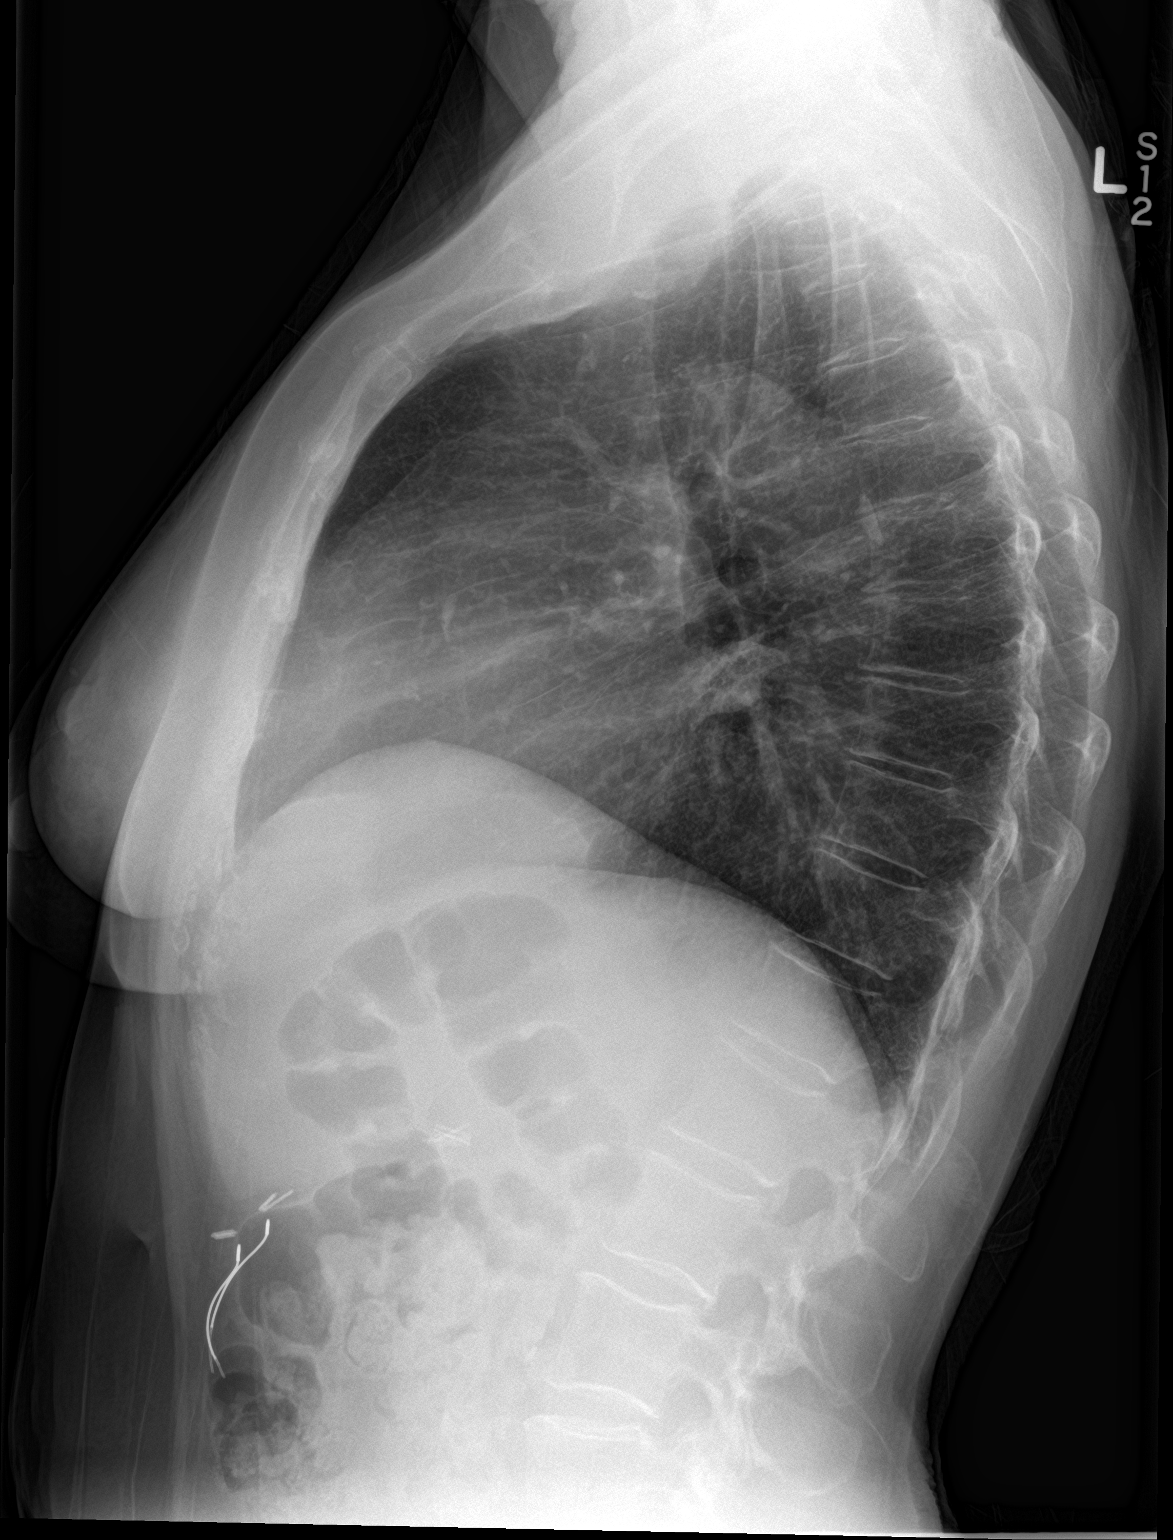

[2 of 2 positions shown; findings below may reference images not displayed]

FINDINGS: Mediastinum and hilar structures normal. Lungs are clear. No pleural
effusion or pneumothorax. Cardiomegaly with normal pulmonary
vascularity. Degenerative changes thoracic spine. Surgical staples
and wires noted over the upper abdomen .
IMPRESSION: No acute cardiopulmonary disease.

## 2018-09-12 ENCOUNTER — Ambulatory Visit: Payer: Self-pay | Admitting: Physician Assistant

## 2018-09-13 ENCOUNTER — Ambulatory Visit: Payer: Medicare Other

## 2018-09-22 ENCOUNTER — Ambulatory Visit: Payer: Medicare Other

## 2018-09-22 ENCOUNTER — Encounter: Payer: Medicare Other | Admitting: Family Medicine

## 2018-10-18 ENCOUNTER — Ambulatory Visit (INDEPENDENT_AMBULATORY_CARE_PROVIDER_SITE_OTHER): Payer: Medicare Other

## 2018-10-18 ENCOUNTER — Other Ambulatory Visit: Payer: Self-pay

## 2018-10-18 DIAGNOSIS — Z0000 Encounter for general adult medical examination without abnormal findings: Secondary | ICD-10-CM | POA: Diagnosis not present

## 2018-10-18 DIAGNOSIS — Z1231 Encounter for screening mammogram for malignant neoplasm of breast: Secondary | ICD-10-CM | POA: Diagnosis not present

## 2018-10-18 NOTE — Progress Notes (Signed)
Subjective:   Sara Huynh is a 68 y.o. female who presents for Medicare Annual (Subsequent) preventive examination.    This visit is being conducted through telemedicine due to the COVID-19 pandemic. This patient has given me verbal consent via doximity to conduct this visit, patient states they are participating from their home address. Some vital signs may be absent or patient reported.    Patient identification: identified by name, DOB, and current address  Review of Systems:  N/A  Cardiac Risk Factors include: advanced age (>88men, >88 women);dyslipidemia;hypertension     Objective:     Vitals: There were no vitals taken for this visit.  There is no height or weight on file to calculate BMI. Unable to obtain vitals due to visit being conducted via telephonically.   Advanced Directives 10/18/2018 09/16/2017 04/13/2017 08/25/2016 08/21/2015  Does Patient Have a Medical Advance Directive? Yes Yes No Yes Yes  Type of Paramedic of [place];Living will Healthcare Power of [place]  Does patient want to make changes to medical advance directive? - - - Yes (ED - Information included in AVS) -  Copy of [place] in Chart? No - copy requested No - copy requested - No - copy requested -  Would patient like information on creating a medical advance directive? - - No - Patient declined - -    Tobacco Social History   Tobacco Use  Smoking Status Never Smoker  Smokeless Tobacco Never Used     Counseling given: Not Answered   Clinical Intake:  Pre-visit preparation completed: Yes  Pain Score: 0-No pain     Nutritional Risks: Nausea/ vomitting/ diarrhea(nausea occasionally due to scleroderma gut) Diabetes: No  How often do you need to have someone help you when you read instructions, pamphlets, or other written materials from your doctor or pharmacy?: 1 - Never  Interpreter  Needed?: No  Information entered by :: [facility], LPN  Past Medical History:  Diagnosis Date  . Anxiety   . GERD (gastroesophageal reflux disease)   . Hyperlipidemia   . Hypertension   . Scleroderma ([place])   . Shingles 12/18/2014   Past Surgical History:  Procedure Laterality Date  . ABDOMINAL HYSTERECTOMY  1986  . APPENDECTOMY    . CHOLECYSTECTOMY  03/2009  . ESOPHAGEAL DILATION    . G tube placed for 18 mths   2007  . gi pacemaker  2002  . NASAL SINUS SURGERY  2005  . OOPHORECTOMY    . PYLOROPLASTY  07/15/15   Family History  Problem Relation Age of Onset  . CAD Mother   . Heart disease Mother        MI  . Dementia Mother   . Alzheimer's disease Mother   . CVA Father   . COPD Father   . Emphysema Father    Social History   Socioeconomic History  . Marital status: Married    Spouse name: Not on file  . Number of children: 3  . Years of education: Not on file  . Highest education level: Some college, no degree  Occupational History  . Occupation: retired  Scientific laboratory technician  . Financial resource strain: Not hard at all  . Food insecurity    Worry: Never true    Inability: Never true  . Transportation needs    Medical: No    Non-medical: No  Tobacco Use  . Smoking status: Never Smoker  .  Smokeless tobacco: Never Used  Substance and Sexual Activity  . Alcohol use: No  . Drug use: No  . Sexual activity: Not on file  Lifestyle  . Physical activity    Days per week: 0 days    Minutes per session: 0 min  . Stress: Only a little  Relationships  . Social Herbalist on phone: Patient refused    Gets together: Patient refused    Attends religious service: Patient refused    Active member of club or organization: Patient refused    Attends meetings of clubs or organizations: Patient refused    Relationship status: Patient refused  Other Topics Concern  . Not on file  Social History Narrative  . Not on file    Outpatient Encounter Medications as of  10/18/2018  Medication Sig  . acetaminophen (TYLENOL) 500 MG tablet Take 1,000 mg by mouth every 8 (eight) hours as needed.   Marland Kitchen escitalopram (LEXAPRO) 10 MG tablet TAKE 1 TABLET BY MOUTH EVERY DAY  . HYDROcodone-acetaminophen (NORCO/VICODIN) 5-325 MG tablet Take 1 tablet by mouth every 4 (four) hours as needed for moderate pain.  Marland Kitchen lisinopril (PRINIVIL,ZESTRIL) 10 MG tablet TAKE 1 TABLET BY MOUTH EVERY DAY **REPLACES LISINOPRIL/HCTZ**  . Multiple Vitamins-Minerals (MULTIVITAMIN ADULT PO) Take 1 tablet by mouth daily.  Marland Kitchen omeprazole-sodium bicarbonate (ZEGERID) 40-1100 MG capsule Take 1 capsule by mouth 2 (two) times daily.   . ondansetron (ZOFRAN) 8 MG tablet Take 1 tablet by mouth as needed.  . polyethylene glycol (MIRALAX / GLYCOLAX) packet Take 17 g by mouth daily as needed.  . simvastatin (ZOCOR) 20 MG tablet TAKE 1 TABLET BY MOUTH EVERYDAY AT BEDTIME  . sucralfate (CARAFATE) 1 g tablet Take 1 g by mouth 4 (four) times daily.   . vitamin B-12 (CYANOCOBALAMIN) 1000 MCG tablet Take 1,000 mcg by mouth daily.   Marland Kitchen alendronate (FOSAMAX) 70 MG tablet Take 1 tablet (70 mg total) by mouth every 7 (seven) days. Take with a full glass of water on an empty stomach. (Patient not taking: Reported on 10/18/2018)  . Ascorbic Acid (VITAMIN C) 1000 MG tablet Take 1,000 mg by mouth daily.  Marland Kitchen linaclotide (LINZESS) 145 MCG CAPS capsule Take 145 mcg by mouth daily.    No facility-administered encounter medications on file as of 10/18/2018.     Activities of Daily Living In your present state of health, do you have any difficulty performing the following activities: 10/18/2018  Hearing? N  Vision? N  Difficulty concentrating or making decisions? N  Walking or climbing stairs? N  Dressing or bathing? N  Doing errands, shopping? N  Preparing Food and eating ? N  Using the Toilet? N  In the past six months, have you accidently leaked urine? N  Do you have problems with loss of bowel control? N  Managing your  Medications? N  Managing your Finances? N  Housekeeping or managing your Housekeeping? N  Some recent data might be hidden    Patient Care Team: Birdie Sons, MD as PCP - General (Family Medicine) Leandrew Koyanagi, MD as Referring Physician (Ophthalmology) Scherrie November, MD as Referring Physician (Internal Medicine)    Assessment:   This is a routine wellness examination for [place].  Exercise Activities and Dietary recommendations Current Exercise Habits: Home exercise routine, Type of exercise: walking, Time (Minutes): 25, Frequency (Times/Week): 5, Weekly Exercise (Minutes/Week): 125, Intensity: Mild, Exercise limited by: None identified  Goals    . Increase water intake  Recommend increasing water intake to 4 glasses a day.    Marland Kitchen LIFESTYLE - DECREASE FALLS RISK     Recommended way to prepare home inside and out to prevent falls.        Fall Risk: Fall Risk  10/18/2018 09/16/2017 08/25/2016 08/21/2015  Falls in the past year? 0 Yes No No  Number falls in past yr: - 1 - -  Injury with Fall? - No - -  Follow up - Falls prevention discussed - -    FALL RISK PREVENTION PERTAINING TO THE HOME:  Any stairs in or around the home? Yes  If so, are there any without handrails? No   Home free of loose throw rugs in walkways, pet beds, electrical cords, etc? Yes  Adequate lighting in your home to reduce risk of falls? Yes   ASSISTIVE DEVICES UTILIZED TO PREVENT FALLS:  Life alert? No  Use of a cane, walker or w/c? No  Grab bars in the bathroom? No  Shower chair or bench in shower? No  Elevated toilet seat or a handicapped toilet? No    TIMED UP AND GO:  Was the test performed? No .    Depression Screen PHQ 2/9 Scores 10/18/2018 09/16/2017 08/25/2016 08/25/2016  PHQ - 2 Score 0 0 0 0  PHQ- 9 Score - - 0 -     Cognitive Function     6CIT Screen 10/18/2018 08/25/2016  What Year? 0 points 0 points  What month? 0 points 0 points  What time? 0 points 0 points  Count  back from 20 0 points 0 points  Months in reverse 0 points 0 points  Repeat phrase 0 points 4 points  Total Score 0 4    Immunization History  Administered Date(s) Administered  . Influenza, High Dose Seasonal PF 01/03/2016, 01/08/2017, 01/28/2018  . Influenza,inj,Quad PF,6+ Mos 01/11/2015  . Pneumococcal Conjugate-13 01/03/2016  . Pneumococcal Polysaccharide-23 01/08/2017  . Td 10/05/2010  . Tdap 10/05/2010    Qualifies for Shingles Vaccine? Yes . Due for Shingrix. Education has been provided regarding the importance of this vaccine. Pt has been advised to call insurance company to determine out of pocket expense. Advised may also receive vaccine at local pharmacy or Health Dept. Verbalized acceptance and understanding.  Tdap: Up to date  Flu Vaccine: Up to date  Pneumococcal Vaccine: Completed series  Screening Tests Health Maintenance  Topic Date Due  . COLONOSCOPY  07/13/2017  . MAMMOGRAM  10/29/2017  . INFLUENZA VACCINE  11/11/2018  . DEXA SCAN  11/03/2019  . TETANUS/TDAP  10/04/2020  . Hepatitis C Screening  Completed  . PNA vac Low Risk Adult  Completed    Cancer Screenings:  Colorectal Screening: Completed 07/23/12. Repeat every 5 years. Pt states Dr Derrill Kay will be setting up her colonoscopy this year.   Mammogram: Completed 10/29/16. Ordered today. Pt provided with contact info and advised to call to schedule appt. Pt aware the office will call re: appt.  Bone Density: Completed 11/02/17. Results reflect OSTEOPOROSIS. Repeat every 2 years.   Lung Cancer Screening: (Low Dose CT Chest recommended if Age 66-80 years, 30 pack-year currently smoking OR have quit w/in 15years.) does not qualify.   Additional Screening:  Hepatitis C Screening: Up to date  Vision Screening: Recommended annual ophthalmology exams for early detection of glaucoma and other disorders of the eye.  Dental Screening: Recommended annual dental exams for proper oral hygiene  Community  Resource Referral:  CRR required this visit?  No  Plan:  I have personally reviewed and addressed the Medicare Annual Wellness questionnaire and have noted the following in the patient's chart:  A. Medical and social history B. Use of alcohol, tobacco or illicit drugs  C. Current medications and supplements D. Functional ability and status E.  Nutritional status F.  Physical activity G. Advance directives H. List of other physicians I.  Hospitalizations, surgeries, and ER visits in previous 12 months J.  [place] such as hearing and vision if needed, cognitive and depression L. Referrals and appointments   In addition, I have reviewed and discussed with patient certain preventive protocols, quality metrics, and best practice recommendations. A written personalized care plan for preventive services as well as general preventive health recommendations were provided to patient. Nurse Health Advisor  Signed,    Tomas Schamp [place], [place]  [id number] Nurse Health Advisor   Nurse Notes: Pt to set up a colonoscopy when she sees Dr Derrill Kay again this year. Mammogram ordered today.

## 2018-10-18 NOTE — Patient Instructions (Signed)
Sara Huynh , Thank you for taking time to come for your Medicare Wellness Visit. I appreciate your ongoing commitment to your health goals. Please review the following plan we discussed and let me know if I can assist you in the future.   Screening recommendations/referrals: Colonoscopy: Pt to set up an a colonoscopy this year with Dr Derrill Kay. Mammogram: Ordered today. Pt provided with contact info and advised to call to schedule appt. Pt aware the office will call re: appt. Bone Density: Up to date, due 10/2019 Recommended yearly ophthalmology/optometry visit for glaucoma screening and checkup Recommended yearly dental visit for hygiene and checkup  Vaccinations: Influenza vaccine: Up to date Pneumococcal vaccine: Completed series Tdap vaccine: Up to date, due 09/2020 Shingles vaccine: Pt declines today.     Advanced directives: Please bring a copy of your POA (Power of Attorney) and/or Living Will to your next appointment.   Conditions/risks identified: Continue to increase water intake to 6-8 8 oz glasses a day.   Next appointment: 11/27/18 @ 10 with Dr Caryn Section. Declined scheduling an AWV for 2021 at this time.    Preventive Care 48 Years and Older, Female Preventive care refers to lifestyle choices and visits with your health care provider that can promote health and wellness. What does preventive care include?  A yearly physical exam. This is also called an annual well check.  Dental exams once or twice a year.  Routine eye exams. Ask your health care provider how often you should have your eyes checked.  Personal lifestyle choices, including:  Daily care of your teeth and gums.  Regular physical activity.  Eating a healthy diet.  Avoiding tobacco and drug use.  Limiting alcohol use.  Practicing safe sex.  Taking low-dose aspirin every day.  Taking vitamin and mineral supplements as recommended by your health care provider. What happens during an annual well  check? The services and screenings done by your health care provider during your annual well check will depend on your age, overall health, lifestyle risk factors, and family history of disease. Counseling  Your health care provider may ask you questions about your:  Alcohol use.  Tobacco use.  Drug use.  Emotional well-being.  Home and relationship well-being.  Sexual activity.  Eating habits.  History of falls.  Memory and ability to understand (cognition).  Work and work Statistician.  Reproductive health. Screening  You may have the following tests or measurements:  Height, weight, and BMI.  Blood pressure.  Lipid and cholesterol levels. These may be checked every 5 years, or more frequently if you are over 25 years old.  Skin check.  Lung cancer screening. You may have this screening every year starting at age 32 if you have a 30-pack-year history of smoking and currently smoke or have quit within the past 15 years.  Fecal occult blood test (FOBT) of the stool. You may have this test every year starting at age 72.  Flexible sigmoidoscopy or colonoscopy. You may have a sigmoidoscopy every 5 years or a colonoscopy every 10 years starting at age 56.  Hepatitis C blood test.  Hepatitis B blood test.  Sexually transmitted disease (STD) testing.  Diabetes screening. This is done by checking your blood sugar (glucose) after you have not eaten for a while (fasting). You may have this done every 1-3 years.  Bone density scan. This is done to screen for osteoporosis. You may have this done starting at age 26.  Mammogram. This may be done every 1-2  years. Talk to your health care provider about how often you should have regular mammograms. Talk with your health care provider about your test results, treatment options, and if necessary, the need for more tests. Vaccines  Your health care provider may recommend certain vaccines, such as:  Influenza vaccine. This is  recommended every year.  Tetanus, diphtheria, and acellular pertussis (Tdap, Td) vaccine. You may need a Td booster every 10 years.  Zoster vaccine. You may need this after age 93.  Pneumococcal 13-valent conjugate (PCV13) vaccine. One dose is recommended after age 31.  Pneumococcal polysaccharide (PPSV23) vaccine. One dose is recommended after age 34. Talk to your health care provider about which screenings and vaccines you need and how often you need them. This information is not intended to replace advice given to you by your health care provider. Make sure you discuss any questions you have with your health care provider. Document Released: 04/25/2015 Document Revised: 12/17/2015 Document Reviewed: 01/28/2015 Elsevier Interactive Patient Education  2017 [place] Prevention in the Home Falls can cause injuries. They can happen to people of all ages. There are many things you can do to make your home safe and to help prevent falls. What can I do on the outside of my home?  Regularly fix the edges of walkways and driveways and fix any cracks.  Remove anything that might make you trip as you walk through a door, such as a raised step or threshold.  Trim any bushes or trees on the path to your home.  Use bright outdoor lighting.  Clear any walking paths of anything that might make someone trip, such as rocks or tools.  Regularly check to see if handrails are loose or broken. Make sure that both sides of any steps have handrails.  Any raised decks and porches should have guardrails on the edges.  Have any leaves, snow, or ice cleared regularly.  Use sand or salt on walking paths during winter.  Clean up any spills in your garage right away. This includes oil or grease spills. What can I do in the bathroom?  Use night lights.  Install grab bars by the toilet and in the tub and shower. Do not use towel bars as grab bars.  Use non-skid mats or decals in the tub or  shower.  If you need to sit down in the shower, use a plastic, non-slip stool.  Keep the floor dry. Clean up any water that spills on the floor as soon as it happens.  Remove soap buildup in the tub or shower regularly.  Attach bath mats securely with double-sided non-slip rug tape.  Do not have throw rugs and other things on the floor that can make you trip. What can I do in the bedroom?  Use night lights.  Make sure that you have a light by your bed that is easy to reach.  Do not use any sheets or blankets that are too big for your bed. They should not hang down onto the floor.  Have a firm chair that has side arms. You can use this for support while you get dressed.  Do not have throw rugs and other things on the floor that can make you trip. What can I do in the kitchen?  Clean up any spills right away.  Avoid walking on wet floors.  Keep items that you use a lot in easy-to-reach places.  If you need to reach something above you, use a strong step  stool that has a grab bar.  Keep electrical cords out of the way.  Do not use floor polish or wax that makes floors slippery. If you must use wax, use non-skid floor wax.  Do not have throw rugs and other things on the floor that can make you trip. What can I do with my stairs?  Do not leave any items on the stairs.  Make sure that there are handrails on both sides of the stairs and use them. Fix handrails that are broken or loose. Make sure that handrails are as long as the stairways.  Check any carpeting to make sure that it is firmly attached to the stairs. Fix any carpet that is loose or worn.  Avoid having throw rugs at the top or bottom of the stairs. If you do have throw rugs, attach them to the floor with carpet tape.  Make sure that you have a light switch at the top of the stairs and the bottom of the stairs. If you do not have them, ask someone to add them for you. What else can I do to help prevent falls?   Wear shoes that:  Do not have high heels.  Have rubber bottoms.  Are comfortable and fit you well.  Are closed at the toe. Do not wear sandals.  If you use a stepladder:  Make sure that it is fully opened. Do not climb a closed stepladder.  Make sure that both sides of the stepladder are locked into place.  Ask someone to hold it for you, if possible.  Clearly mark and make sure that you can see:  Any grab bars or handrails.  First and last steps.  Where the edge of each step is.  Use tools that help you move around (mobility aids) if they are needed. These include:  Canes.  Walkers.  Scooters.  Crutches.  Turn on the lights when you go into a dark area. Replace any light bulbs as soon as they burn out.  Set up your furniture so you have a clear path. Avoid moving your furniture around.  If any of your floors are uneven, fix them.  If there are any pets around you, be aware of where they are.  Review your medicines with your doctor. Some medicines can make you feel dizzy. This can increase your chance of falling. Ask your doctor what other things that you can do to help prevent falls. This information is not intended to replace advice given to you by your health care provider. Make sure you discuss any questions you have with your health care provider. Document Released: 01/23/2009 Document Revised: 09/04/2015 Document Reviewed: 05/03/2014 Elsevier Interactive Patient Education  2017 [employer].

## 2018-11-22 ENCOUNTER — Other Ambulatory Visit: Payer: Self-pay | Admitting: Family Medicine

## 2018-11-27 ENCOUNTER — Encounter: Payer: Self-pay | Admitting: Family Medicine

## 2019-01-01 ENCOUNTER — Encounter: Payer: Self-pay | Admitting: Family Medicine

## 2019-01-01 ENCOUNTER — Ambulatory Visit (INDEPENDENT_AMBULATORY_CARE_PROVIDER_SITE_OTHER): Payer: Medicare Other | Admitting: Family Medicine

## 2019-01-01 ENCOUNTER — Other Ambulatory Visit: Payer: Self-pay

## 2019-01-01 VITALS — BP 138/82 | HR 60 | Temp 97.1°F | Resp 16 | Ht 60.0 in | Wt 124.4 lb

## 2019-01-01 DIAGNOSIS — M341 CR(E)ST syndrome: Secondary | ICD-10-CM

## 2019-01-01 DIAGNOSIS — M810 Age-related osteoporosis without current pathological fracture: Secondary | ICD-10-CM

## 2019-01-01 DIAGNOSIS — M349 Systemic sclerosis, unspecified: Secondary | ICD-10-CM | POA: Diagnosis not present

## 2019-01-01 DIAGNOSIS — Z23 Encounter for immunization: Secondary | ICD-10-CM | POA: Diagnosis not present

## 2019-01-01 DIAGNOSIS — E7800 Pure hypercholesterolemia, unspecified: Secondary | ICD-10-CM

## 2019-01-01 DIAGNOSIS — E559 Vitamin D deficiency, unspecified: Secondary | ICD-10-CM

## 2019-01-01 DIAGNOSIS — I10 Essential (primary) hypertension: Secondary | ICD-10-CM

## 2019-01-01 NOTE — Patient Instructions (Signed)
.   Please review the attached list of medications and notify my office if there are any errors.   . Please bring all of your medications to every appointment so we can make sure that our medication list is the same as yours.   . It is especially important to get the annual flu vaccine this year. If you haven't had it already, please go to your pharmacy or call the office as soon as possible to schedule you flu shot.  

## 2019-01-01 NOTE — Progress Notes (Signed)
Patient: Sara Huynh, Female    DOB: [birth year], 68 y.o.   MRN: [id number] Visit Date: 01/01/2019  Today's Provider: Lelon Huh, MD   Chief Complaint  Patient presents with  . Hypertension   Subjective:     Hypertension, follow-up:  BP Readings from Last 3 Encounters:  01/01/19 138/82  09/16/17 (!) 100/56  08/18/17 130/80    She was last seen for hypertension 1 years ago.  BP at that visit was 100/56. Management changes since that visit include BP has been consistently low and she has been fatigued. Will change lisinopril-hctz to lisinopril and she is to call if she sees BP into the 140s or higher. . She reports excellent compliance with treatment. She is not having side effects.  She is exercising. She is adherent to low salt diet.   Outside blood pressures are stable. She is experiencing none.  Patient denies chest pain.   Cardiovascular risk factors include hypertension.  Use of agents associated with hypertension: none.     Weight trend: stable Wt Readings from Last 3 Encounters:  01/01/19 124 lb 6.4 oz (56.4 kg)  09/16/17 118 lb 9.6 oz (53.8 kg)  08/18/17 120 lb (54.4 kg)    Current diet: in general, a "healthy" diet    ------------------------------------------------------------------------  Lipid/Cholesterol, Follow-up:   Last seen for this1 years ago.  Management changes since that visit include no changes. . Last Lipid Panel:    Component Value Date/Time   CHOL 213 (H) 09/16/2017 1006   TRIG 299 (H) 09/16/2017 1006   HDL 58 09/16/2017 1006   CHOLHDL 3.7 09/16/2017 1006   LDLCALC 95 09/16/2017 1006    Risk factors for vascular disease include hypertension  She reports excellent compliance with treatment. She is not having side effects.  Current symptoms include none and have been stable. Weight trend: stable Prior visit with dietician: no Current diet: in general, a "healthy" diet   Current exercise: walking  Wt Readings  from Last 3 Encounters:  01/01/19 124 lb 6.4 oz (56.4 kg)  09/16/17 118 lb 9.6 oz (53.8 kg)  08/18/17 120 lb (54.4 kg)    -------------------------------------------------------------------  Follow up scleroderma.  Continues to have some mild difficulty swallowing, but stable. States is scheduled to follow up with GI Dr. Derrill Kay at [facility] and anticipates EGD and colonoscopy this year.   Review of Systems  Constitutional: Negative.   HENT: Positive for sinus pressure.   Eyes: Negative.   Respiratory: Negative.   Cardiovascular: Negative.   Gastrointestinal: Positive for nausea.  Endocrine: Positive for cold intolerance.  Genitourinary: Negative.   Musculoskeletal: Negative.   Skin: Negative.   Allergic/Immunologic: Positive for environmental allergies.  Neurological: Negative.   Hematological: Negative.   Psychiatric/Behavioral: Negative.     Social History   Socioeconomic History  . Marital status: Married    Spouse name: Not on file  . Number of children: 3  . Years of education: Not on file  . Highest education level: Some college, no degree  Occupational History  . Occupation: retired  Scientific laboratory technician  . Financial resource strain: Not hard at all  . Food insecurity    Worry: Never true    Inability: Never true  . Transportation needs    Medical: No    Non-medical: No  Tobacco Use  . Smoking status: Never Smoker  . Smokeless tobacco: Never Used  Substance and Sexual Activity  . Alcohol use: No  . Drug use: No  .  Sexual activity: Not on file  Lifestyle  . Physical activity    Days per week: 0 days    Minutes per session: 0 min  . Stress: Only a little  Relationships  . Social Herbalist on phone: Patient refused    Gets together: Patient refused    Attends religious service: Patient refused    Active member of club or organization: Patient refused    Attends meetings of clubs or organizations: Patient refused    Relationship status: Patient  refused  . Intimate partner violence    Fear of current or ex partner: Patient refused    Emotionally abused: Patient refused    Physically abused: Patient refused    Forced sexual activity: Patient refused  Other Topics Concern  . Not on file  Social History Narrative  . Not on file      Current Outpatient Medications:  .  acetaminophen (TYLENOL) 500 MG tablet, Take 1,000 mg by mouth every 8 (eight) hours as needed. , Disp: , Rfl:  .  escitalopram (LEXAPRO) 10 MG tablet, TAKE 1 TABLET BY MOUTH EVERY DAY, Disp: 90 tablet, Rfl: 0 .  lisinopril (PRINIVIL,ZESTRIL) 10 MG tablet, TAKE 1 TABLET BY MOUTH EVERY DAY **REPLACES LISINOPRIL/HCTZ**, Disp: 90 tablet, Rfl: 4 .  Multiple Vitamins-Minerals (MULTIVITAMIN ADULT PO), Take 1 tablet by mouth daily., Disp: , Rfl:  .  omeprazole-sodium bicarbonate (ZEGERID) 40-1100 MG capsule, Take 1 capsule by mouth 2 (two) times daily. , Disp: , Rfl:  .  ondansetron (ZOFRAN) 8 MG tablet, Take 1 tablet by mouth as needed., Disp: , Rfl:  .  polyethylene glycol (MIRALAX / GLYCOLAX) packet, Take 17 g by mouth daily as needed., Disp: , Rfl:  .  simvastatin (ZOCOR) 20 MG tablet, TAKE 1 TABLET BY MOUTH EVERYDAY AT BEDTIME, Disp: 90 tablet, Rfl: 4 .  sucralfate (CARAFATE) 1 g tablet, Take 1 g by mouth 4 (four) times daily. , Disp: , Rfl:   Patient Care Team: Birdie Sons, MD as PCP - General (Family Medicine) Leandrew Koyanagi, MD as Referring Physician (Ophthalmology) Scherrie November, MD as Referring Physician (Internal Medicine)     Objective:    Vitals: BP 138/82 (BP Location: Left Arm, Patient Position: Sitting, Cuff Size: Normal)   Pulse 60   Temp (!) 97.1 F (36.2 C) (Temporal)   Resp 16   Ht 5' (1.524 m)   Wt 124 lb 6.4 oz (56.4 kg)   BMI 24.30 kg/m   Physical Exam  General Appearance:    Alert, cooperative, no distress  Eyes:    PERRL, conjunctiva/corneas clear, EOM's intact       Lungs:     Clear to auscultation bilaterally,  respirations unlabored  Heart:    Normal heart rate. Normal rhythm. No murmurs, rubs, or gallops.   MS:   All extremities are intact.   Neurologic:   Awake, alert, oriented x 3. No apparent focal neurological           defect.           Assessment & Plan:    1. Need for influenza vaccination Given today - Flu Vaccine QUAD High Dose(Fluad)  2. 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 Essential hypertension Fairly well controlled.  - Comprehensive metabolic panel  4. Osteoporosis, unspecified osteoporosis type, unspecified pathological fracture presence Intolerant to alendronate. Continue vitamin d, BMD 2021  5. Avitaminosis D  - VITAMIN D 25  Hydroxy (Vit-D Deficiency, Fractures)  6. Hypercholesteremia She is tolerating simvastatin well with no adverse effects.   - Comprehensive metabolic panel - Lipid panel  7. Scleroderma ([place]) Stable, continue current care plan --------------------------------------------    Lelon Huh, MD  [facility]

## 2019-01-02 LAB — LIPID PANEL
Chol/HDL Ratio: 4.1 ratio (ref 0.0–4.4)
Cholesterol, Total: 206 mg/dL — ABNORMAL HIGH (ref 100–199)
HDL: 50 mg/dL (ref >39)
LDL Chol Calc (NIH): 94 mg/dL (ref 0–99)
Triglycerides: 377 mg/dL — ABNORMAL HIGH (ref 0–149)
VLDL Cholesterol Cal: 62 mg/dL — ABNORMAL HIGH (ref 5–40)

## 2019-01-02 LAB — COMPREHENSIVE METABOLIC PANEL
ALT: 17 IU/L (ref 0–32)
AST: 20 IU/L (ref 0–40)
Albumin/Globulin Ratio: 1.9 (ref 1.2–2.2)
Albumin: 4.6 g/dL (ref 3.8–4.8)
Alkaline Phosphatase: 58 IU/L (ref 39–117)
BUN/Creatinine Ratio: 11 — ABNORMAL LOW (ref 12–28)
BUN: 12 mg/dL (ref 8–27)
Bilirubin Total: 0.7 mg/dL (ref 0.0–1.2)
CO2: 29 mmol/L (ref 20–29)
Calcium: 9.8 mg/dL (ref 8.7–10.3)
Chloride: 100 mmol/L (ref 96–106)
Creatinine, Ser: 1.14 mg/dL — ABNORMAL HIGH (ref 0.57–1.00)
GFR calc Af Amer: 57 mL/min/{1.73_m2} — ABNORMAL LOW (ref >59)
GFR calc non Af Amer: 50 mL/min/{1.73_m2} — ABNORMAL LOW (ref >59)
Globulin, Total: 2.4 g/dL (ref 1.5–4.5)
Glucose: 88 mg/dL (ref 65–99)
Potassium: 4.4 mmol/L (ref 3.5–5.2)
Sodium: 143 mmol/L (ref 134–144)
Total Protein: 7 g/dL (ref 6.0–8.5)

## 2019-01-02 LAB — VITAMIN D 25 HYDROXY (VIT D DEFICIENCY, FRACTURES): Vit D, 25-Hydroxy: 59.3 ng/mL (ref 30.0–100.0)

## 2019-01-03 ENCOUNTER — Ambulatory Visit
Admission: RE | Admit: 2019-01-03 | Discharge: 2019-01-03 | Disposition: A | Discharge status: Home / Self Care | Payer: Medicare Other | Source: Ambulatory Visit | Attending: Family Medicine | Admitting: Family Medicine

## 2019-01-03 ENCOUNTER — Other Ambulatory Visit: Payer: Self-pay

## 2019-01-03 DIAGNOSIS — R6889 Other general symptoms and signs: Secondary | ICD-10-CM | POA: Diagnosis not present

## 2019-01-03 DIAGNOSIS — Z1231 Encounter for screening mammogram for malignant neoplasm of breast: Secondary | ICD-10-CM | POA: Insufficient documentation

## 2019-01-03 DIAGNOSIS — Z20822 Contact with and (suspected) exposure to covid-19: Secondary | ICD-10-CM

## 2019-01-05 LAB — NOVEL CORONAVIRUS, NAA: SARS-CoV-2, NAA: NOT DETECTED

## 2019-01-09 ENCOUNTER — Other Ambulatory Visit: Payer: Self-pay | Admitting: Physician Assistant

## 2019-02-27 DIAGNOSIS — Z20828 Contact with and (suspected) exposure to other viral communicable diseases: Secondary | ICD-10-CM | POA: Diagnosis not present

## 2019-02-27 DIAGNOSIS — U071 COVID-19: Secondary | ICD-10-CM | POA: Diagnosis not present

## 2019-02-27 DIAGNOSIS — I10 Essential (primary) hypertension: Secondary | ICD-10-CM | POA: Diagnosis not present

## 2019-03-30 ENCOUNTER — Other Ambulatory Visit: Payer: Self-pay | Admitting: Family Medicine

## 2019-03-30 DIAGNOSIS — I10 Essential (primary) hypertension: Secondary | ICD-10-CM

## 2019-04-18 ENCOUNTER — Ambulatory Visit: Payer: Self-pay | Admitting: *Deleted

## 2019-04-18 NOTE — Telephone Encounter (Signed)
Pt called in c/o her BP being elevated and having headaches.   She was diagnosed with COVID-19 on February 27, 2019.   She had headaches with it and is still having nasal congestion.  She is requesting to come in and see Dr. Caryn Section instead of a virtual visit if that is possible with her symptoms since the COVID diagnoses was made back in November.  She is going out of town in a few days and wanted to make sure she was ok before she went especially since her BP is elevated.  I attempted to call the flow coordinator line and the front desk however I did not get an answer.  Pt was agreeable to me sending a high priority note asking if Dr. Caryn Section would be willing to see her versus a virtual visit and give her a call back to set up an appt.  I sent these notes to Dr. Maralyn Sago office high priority.   Reason for Disposition . [1] Taking BP medications AND [2] feels is having side effects (e.g., impotence, cough, dizzy upon standing)    Headaches  Answer Assessment - Initial Assessment Questions 1. BLOOD PRESSURE: "What is the blood pressure?" "Did you take at least two measurements 5 minutes apart?"     *190/91   194/92 last night.   I have a headache.   I had COVID in November.    I'm suppose to leave next Thurs for a family member to have surgery. When I was sick with COVID I had congestion, headache.    Now I'm having pain on the right side in the back by my bra band.   2. ONSET: "When did you take your blood pressure?"     190/91Just a new minutes ago 3. HOW: "How did you obtain the blood pressure?" (e.g., visiting nurse, automatic home BP monitor)     Have a home BP kit. 4. HISTORY: "Do you have a history of high blood pressure?"     Yes 5. MEDICATIONS: "Are you taking any medications for blood pressure?" "Have you missed any doses recently?"     Yes      Not missed any doses. 6. OTHER SYMPTOMS: "Do you have any symptoms?" (e.g., headache, chest pain, blurred vision, difficulty breathing,  weakness)     Only a headache.   I've not felt good since I had the COVID.   I still have the congestion and headaches.    I had headaches with the COVID. 7. PREGNANCY: "Is there any chance you are pregnant?" "When was your last menstrual period?"     N/A due to age  Protocols used: [place]

## 2019-04-19 ENCOUNTER — Ambulatory Visit: Payer: Medicare Other | Attending: Internal Medicine

## 2019-04-19 DIAGNOSIS — Z20822 Contact with and (suspected) exposure to covid-19: Secondary | ICD-10-CM | POA: Diagnosis not present

## 2019-04-19 NOTE — Telephone Encounter (Signed)
Please advise 

## 2019-04-19 NOTE — Telephone Encounter (Signed)
I am out of the office today and my schedule is completely booked Friday afternoon. We will probably be closed tomorrow morning due to weather. I would suggest making an appointment with a PA tomorrow afternoon. If we end up opening tomorrow morning she could make an appointment to see me then.

## 2019-04-19 NOTE — Telephone Encounter (Signed)
From [facility], pt of Sara Huynh

## 2019-04-19 NOTE — Telephone Encounter (Signed)
LMTCB

## 2019-04-21 LAB — NOVEL CORONAVIRUS, NAA: SARS-CoV-2, NAA: NOT DETECTED

## 2019-05-08 DIAGNOSIS — H02883 Meibomian gland dysfunction of right eye, unspecified eyelid: Secondary | ICD-10-CM | POA: Diagnosis not present

## 2019-05-22 ENCOUNTER — Other Ambulatory Visit: Payer: Self-pay | Admitting: Family Medicine

## 2019-05-22 ENCOUNTER — Other Ambulatory Visit: Payer: Self-pay | Admitting: Physician Assistant

## 2019-05-22 DIAGNOSIS — I10 Essential (primary) hypertension: Secondary | ICD-10-CM

## 2019-05-22 NOTE — Telephone Encounter (Signed)
Requested medication (s) are due for refill today: yes  Requested medication (s) are on the active medication list: yes  Last refill:  01/10/2019  Future visit scheduled: no  Notes to clinic: no valid encounter within last 6 months     Requested Prescriptions  Pending Prescriptions Disp Refills   escitalopram (LEXAPRO) 10 MG tablet [Pharmacy Med Name: ESCITALOPRAM 10 MG TABLET] 90 tablet 0    Sig: TAKE 1 TABLET BY MOUTH EVERY DAY      Psychiatry:  Antidepressants - SSRI Failed - 05/22/2019  8:44 AM      Failed - Valid encounter within last 6 months    Recent Outpatient Visits           4 months ago Need for influenza vaccination   [facility] Birdie Sons, MD   1 year ago Essential hypertension   [facility] Birdie Sons, MD   1 year ago Dizziness   [facility] Birdie Sons, MD   1 year ago Cough   [facility] Birdie Sons, MD   1 year ago Cough with fever   [facility] Birdie Sons, MD

## 2019-06-27 ENCOUNTER — Other Ambulatory Visit: Payer: Self-pay | Admitting: Family Medicine

## 2019-06-27 DIAGNOSIS — R112 Nausea with vomiting, unspecified: Secondary | ICD-10-CM | POA: Diagnosis not present

## 2019-06-27 DIAGNOSIS — K313 Pylorospasm, not elsewhere classified: Secondary | ICD-10-CM | POA: Diagnosis not present

## 2019-06-27 DIAGNOSIS — K219 Gastro-esophageal reflux disease without esophagitis: Secondary | ICD-10-CM | POA: Diagnosis not present

## 2019-06-27 DIAGNOSIS — K3184 Gastroparesis: Secondary | ICD-10-CM | POA: Diagnosis not present

## 2019-06-27 DIAGNOSIS — I10 Essential (primary) hypertension: Secondary | ICD-10-CM

## 2019-06-27 DIAGNOSIS — Z79899 Other long term (current) drug therapy: Secondary | ICD-10-CM | POA: Diagnosis not present

## 2019-06-27 DIAGNOSIS — Z9889 Other specified postprocedural states: Secondary | ICD-10-CM | POA: Diagnosis not present

## 2019-06-27 NOTE — Telephone Encounter (Signed)
Spoke with pt. And she reports she is usually seen once a year.

## 2019-07-05 DIAGNOSIS — Z9189 Other specified personal risk factors, not elsewhere classified: Secondary | ICD-10-CM | POA: Diagnosis not present

## 2019-07-05 DIAGNOSIS — Z20822 Contact with and (suspected) exposure to covid-19: Secondary | ICD-10-CM | POA: Diagnosis not present

## 2019-07-12 DIAGNOSIS — R12 Heartburn: Secondary | ICD-10-CM | POA: Diagnosis not present

## 2019-07-12 DIAGNOSIS — K5730 Diverticulosis of large intestine without perforation or abscess without bleeding: Secondary | ICD-10-CM | POA: Diagnosis not present

## 2019-07-12 DIAGNOSIS — R1013 Epigastric pain: Secondary | ICD-10-CM | POA: Diagnosis not present

## 2019-07-12 DIAGNOSIS — Z1211 Encounter for screening for malignant neoplasm of colon: Secondary | ICD-10-CM | POA: Diagnosis not present

## 2019-07-12 LAB — HM COLONOSCOPY

## 2019-07-30 DIAGNOSIS — I517 Cardiomegaly: Secondary | ICD-10-CM | POA: Diagnosis not present

## 2019-07-30 DIAGNOSIS — R1114 Bilious vomiting: Secondary | ICD-10-CM | POA: Diagnosis not present

## 2019-07-30 DIAGNOSIS — I7789 Other specified disorders of arteries and arterioles: Secondary | ICD-10-CM | POA: Diagnosis not present

## 2019-07-30 DIAGNOSIS — R000 Tachycardia, unspecified: Secondary | ICD-10-CM | POA: Diagnosis not present

## 2019-07-30 DIAGNOSIS — K449 Diaphragmatic hernia without obstruction or gangrene: Secondary | ICD-10-CM | POA: Diagnosis not present

## 2019-07-30 DIAGNOSIS — K6389 Other specified diseases of intestine: Secondary | ICD-10-CM | POA: Diagnosis not present

## 2019-07-30 DIAGNOSIS — R110 Nausea: Secondary | ICD-10-CM | POA: Diagnosis not present

## 2019-07-30 DIAGNOSIS — R109 Unspecified abdominal pain: Secondary | ICD-10-CM | POA: Diagnosis not present

## 2019-07-31 DIAGNOSIS — I7789 Other specified disorders of arteries and arterioles: Secondary | ICD-10-CM | POA: Diagnosis not present

## 2019-07-31 DIAGNOSIS — K6389 Other specified diseases of intestine: Secondary | ICD-10-CM | POA: Diagnosis not present

## 2019-08-17 ENCOUNTER — Other Ambulatory Visit: Payer: Self-pay | Admitting: Family Medicine

## 2019-08-20 IMAGING — CR DG CHEST 2V
1 series · 2 of 2 positions shown · non-contrast
Comparison: 06/27/2017

CLINICAL DATA: Follow-up abnormal chest x-ray.

EXAM:
CHEST - 2 VIEW

[Series 1: dg chest 2 view · 0.14mm/px · 2 of 2 slices shown]
[im 1/2]
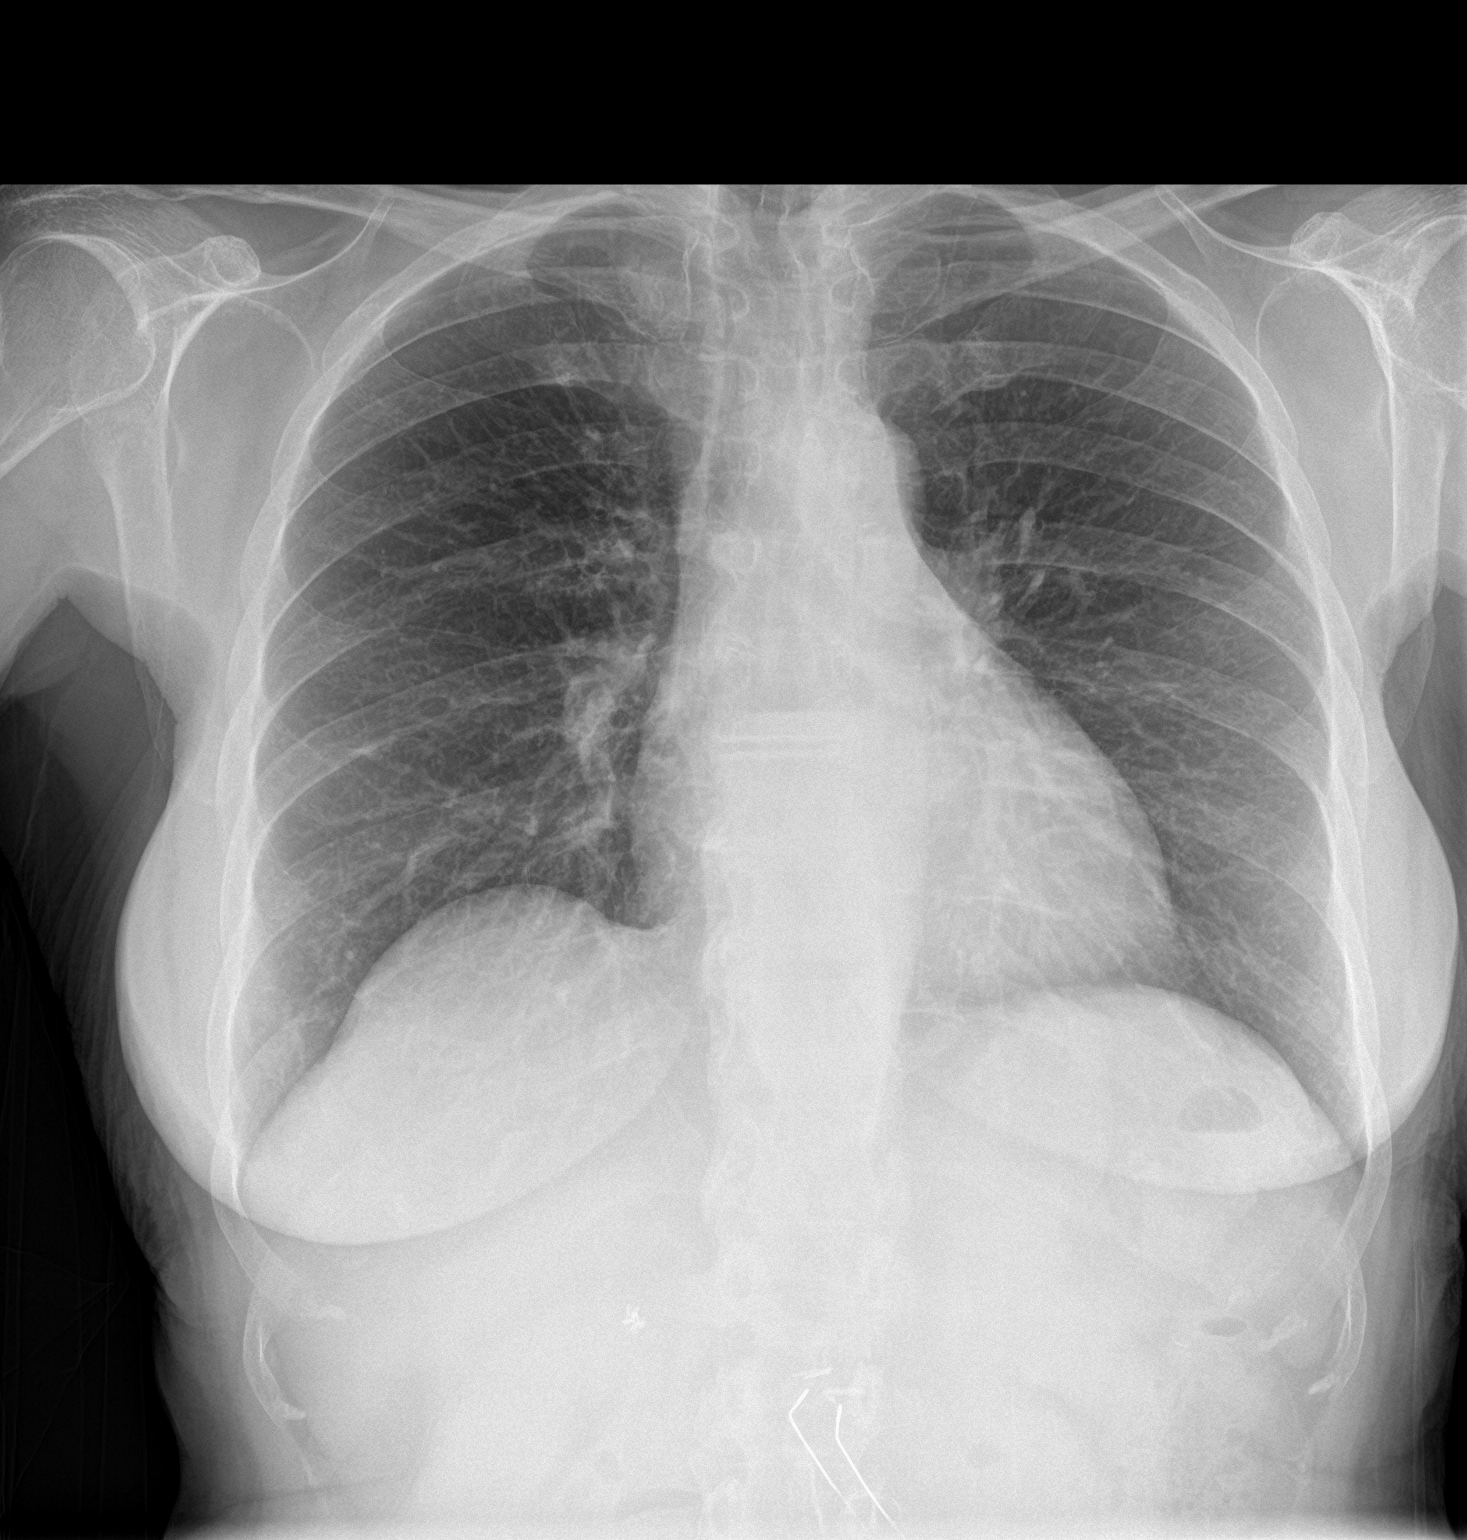
[im 2/2]
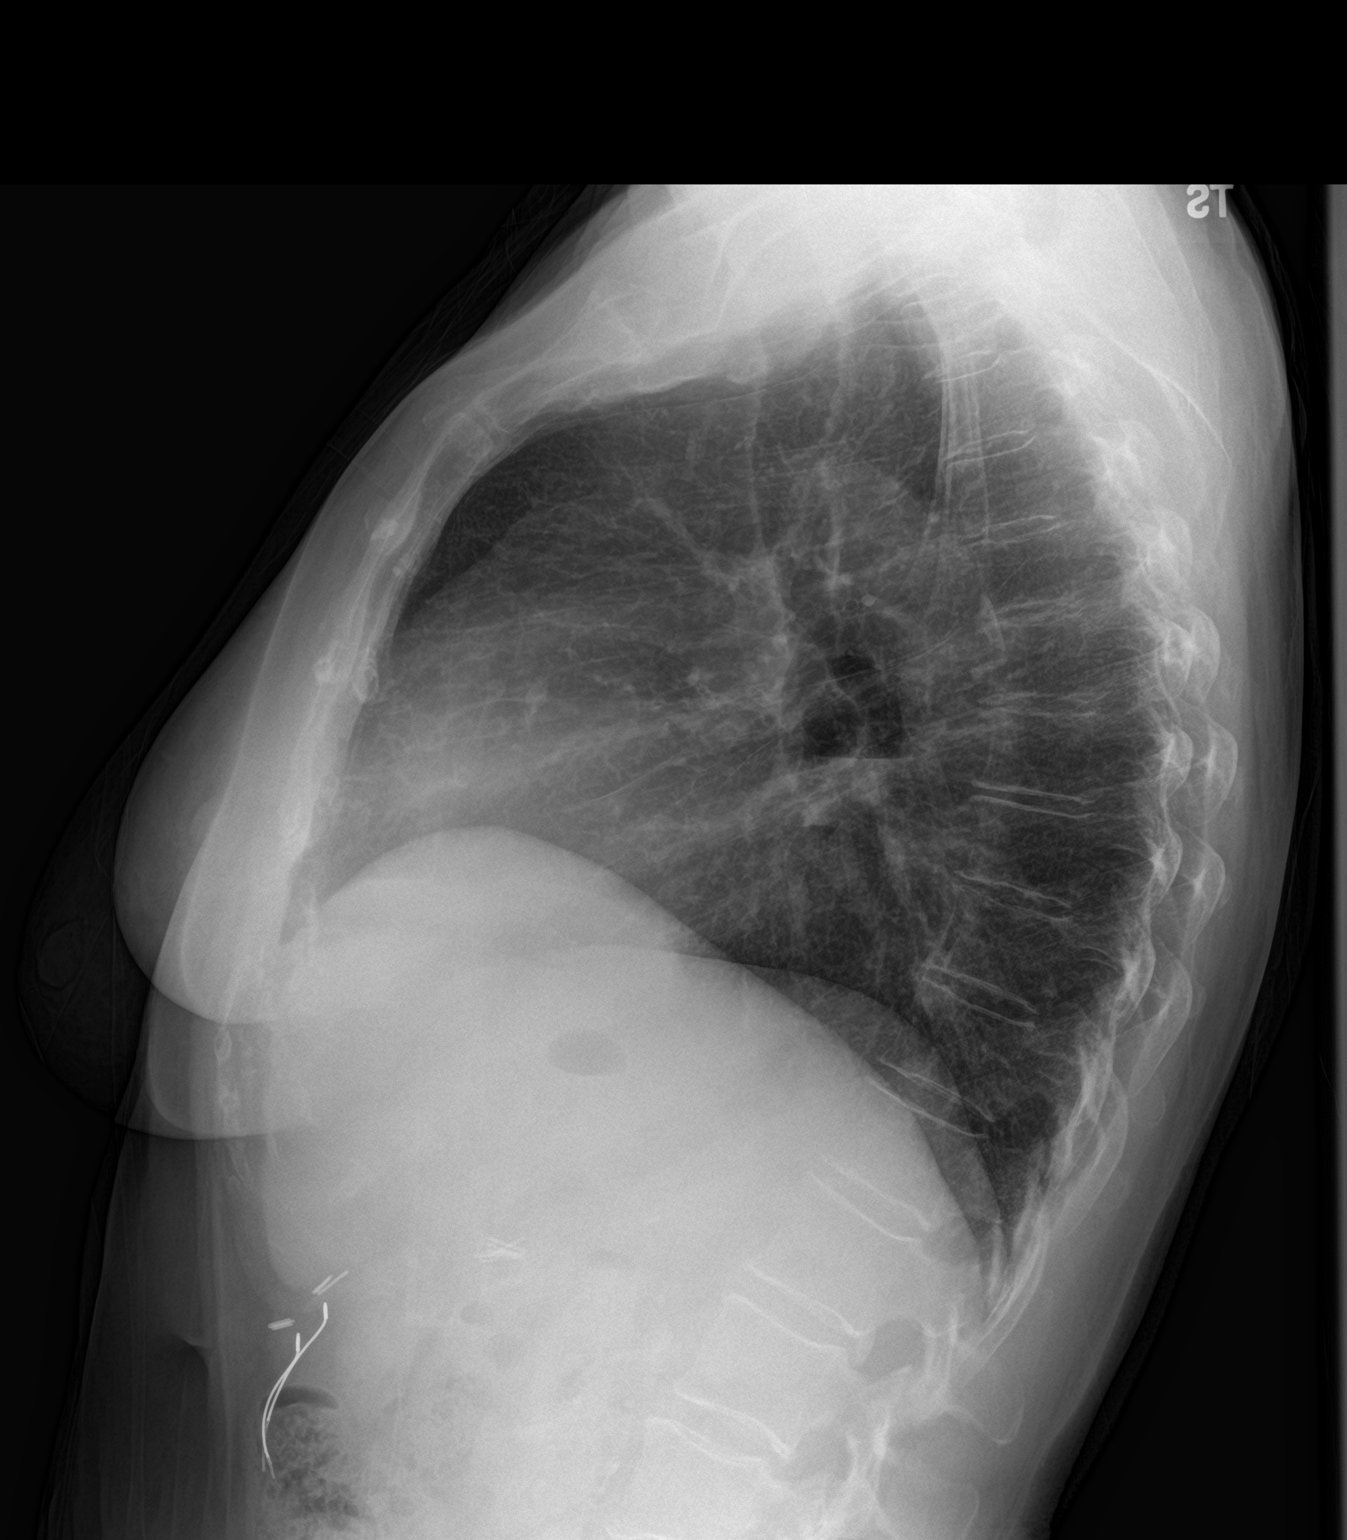

[2 of 2 positions shown; findings below may reference images not displayed]

FINDINGS: The heart size and mediastinal contours are within normal limits.
Both lungs are clear. The visualized skeletal structures are
unremarkable.
IMPRESSION: No active cardiopulmonary disease.

## 2019-08-28 IMAGING — CR DG CHEST 2V
2 series · 2 of 2 positions shown · non-contrast
Comparison: Chest radiograph 08/08/2017

CLINICAL DATA: Chest pain and dizziness

EXAM:
CHEST - 2 VIEW

[chest lat]
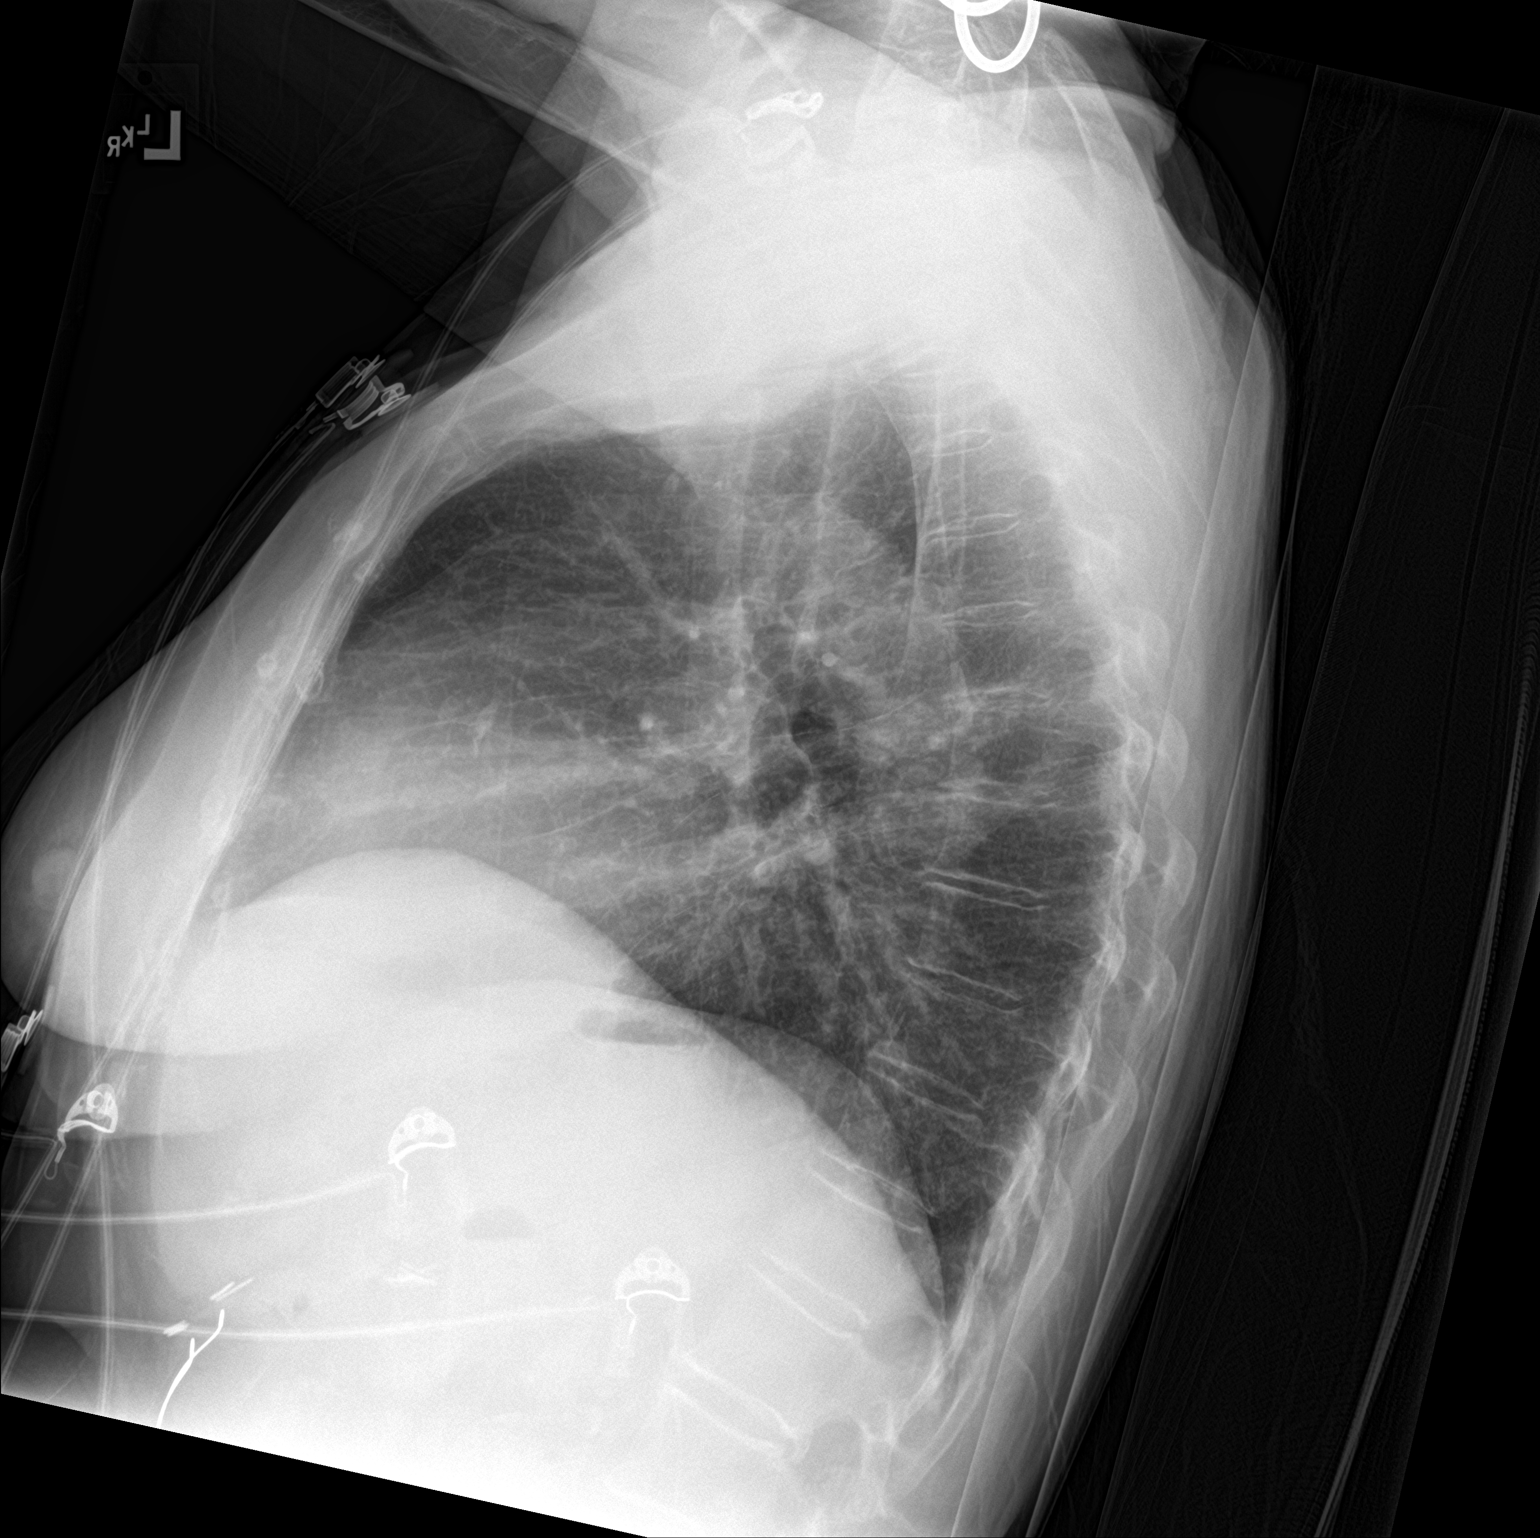

[chest ap]
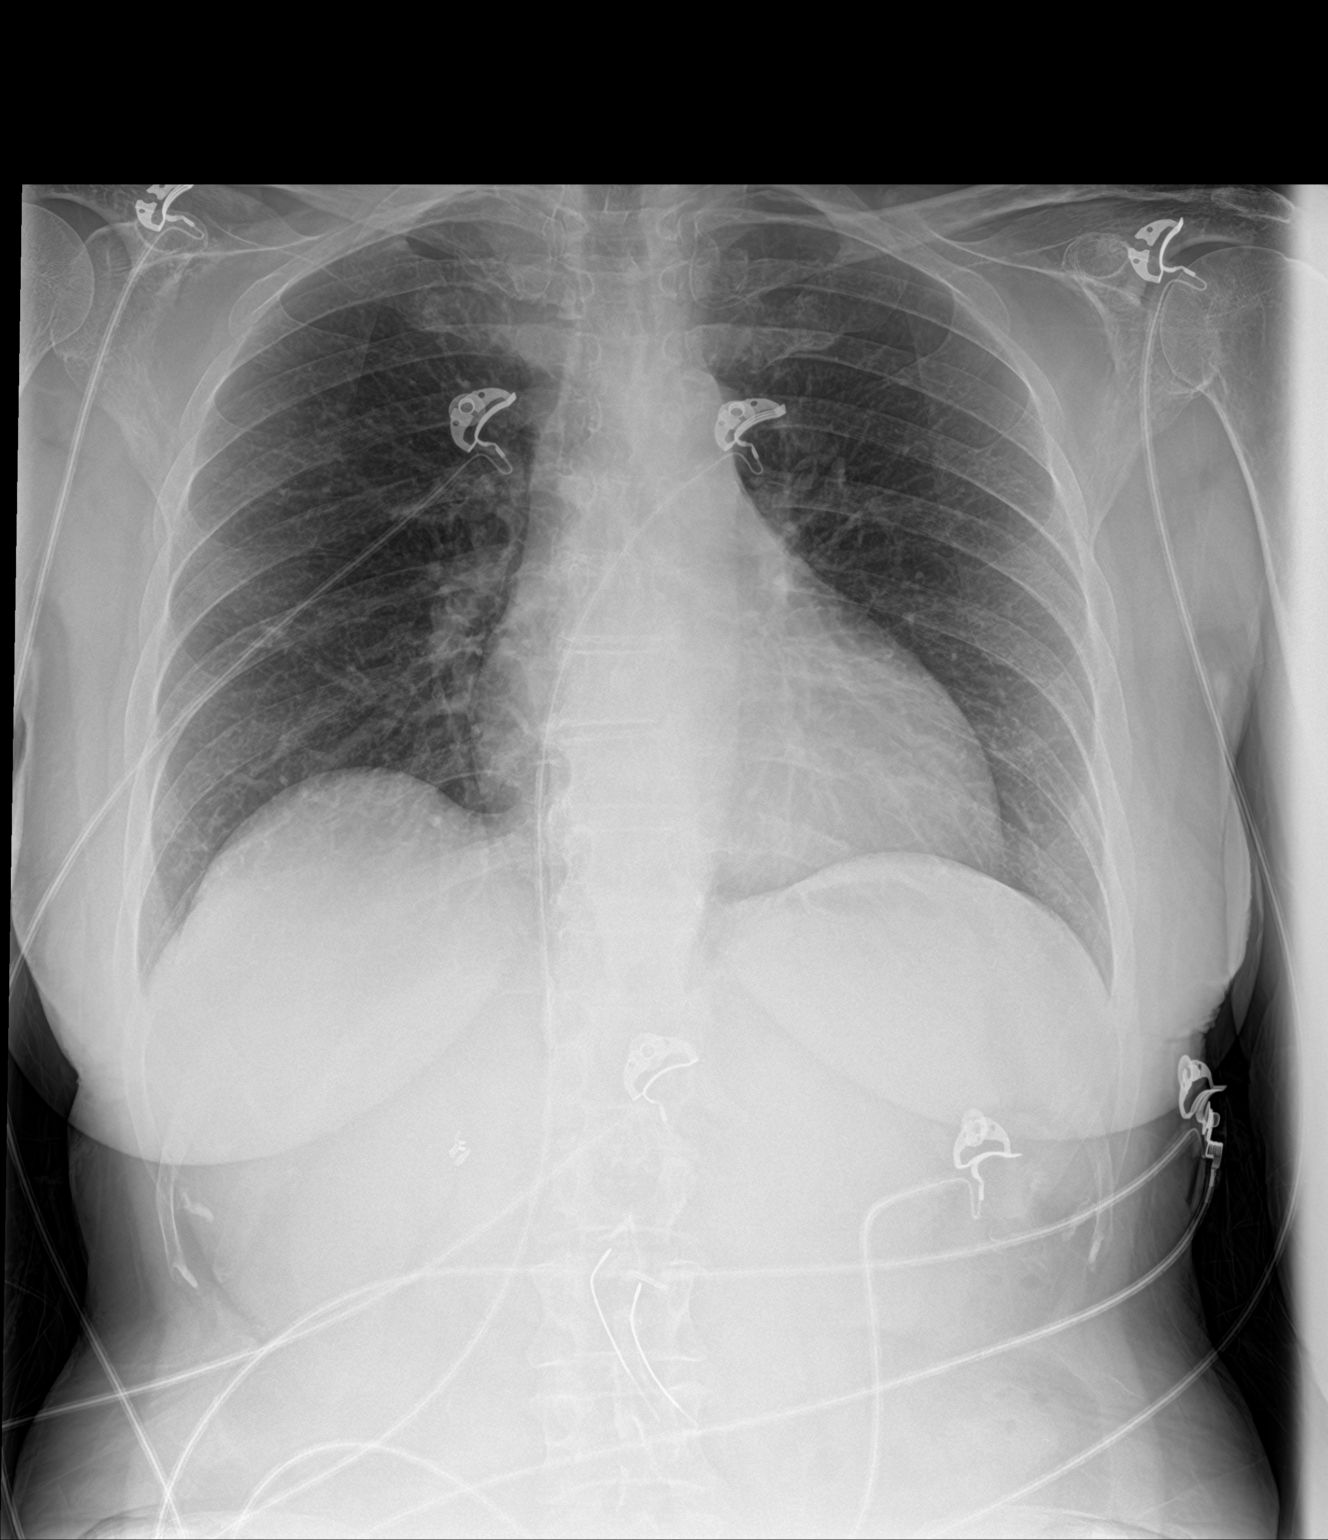

[2 of 2 positions shown; findings below may reference images not displayed]

FINDINGS: The heart size and mediastinal contours are within normal limits.
Both lungs are clear. The visualized skeletal structures are
unremarkable.
IMPRESSION: No active cardiopulmonary disease.

## 2019-09-21 ENCOUNTER — Other Ambulatory Visit: Payer: Self-pay | Admitting: Family Medicine

## 2019-09-21 DIAGNOSIS — I10 Essential (primary) hypertension: Secondary | ICD-10-CM

## 2019-09-21 NOTE — Telephone Encounter (Signed)
Pt. Scheduled appointment for next week.

## 2019-09-26 ENCOUNTER — Other Ambulatory Visit: Payer: Self-pay

## 2019-09-26 ENCOUNTER — Encounter: Payer: Self-pay | Admitting: Family Medicine

## 2019-09-26 ENCOUNTER — Ambulatory Visit (INDEPENDENT_AMBULATORY_CARE_PROVIDER_SITE_OTHER): Payer: Medicare Other | Admitting: Family Medicine

## 2019-09-26 VITALS — BP 162/80 | HR 58 | Temp 97.1°F | Wt 121.4 lb

## 2019-09-26 DIAGNOSIS — L219 Seborrheic dermatitis, unspecified: Secondary | ICD-10-CM

## 2019-09-26 MED ORDER — NEOMYCIN-POLYMYXIN-HC 3.5-10000-1 OT SUSP: 3.0000 [drp] | Freq: Four times a day (QID) | OTIC | 3 refills | Status: DC

## 2019-09-26 NOTE — Progress Notes (Signed)
     Established patient visit   Patient: Sara Huynh   DOB: May 11, 1950   69 y.o. Female  MRN: [id number] Visit Date: 09/26/2019  Today's healthcare provider: Lelon Huh, MD   Chief Complaint  Patient presents with  . Ear Fullness   Mertie Moores as a scribe for Lelon Huh, MD.,have documented all relevant documentation on the behalf of Lelon Huh, MD,as directed by  Lelon Huh, MD while in the presence of Lelon Huh, MD.  Subjective    Ear Fullness  There is pain in both ears. This is a new problem. The current episode started more than 1 month ago. The problem occurs constantly. The problem has been waxing and waning. There has been no fever. The patient is experiencing no pain. Associated symptoms comments: Itchiness, drainage and "popping". She has tried nothing for the symptoms.       Medications: Outpatient Medications Prior to Visit  Medication Sig  . acetaminophen (TYLENOL) 500 MG tablet Take 1,000 mg by mouth every 8 (eight) hours as needed.   Marland Kitchen escitalopram (LEXAPRO) 10 MG tablet TAKE 1 TABLET BY MOUTH EVERY DAY  . lisinopril (ZESTRIL) 10 MG tablet TAKE 1 TABLET BY MOUTH EVERY DAY **REPLACES LISINOPRIL/HCTZ**  . omeprazole-sodium bicarbonate (ZEGERID) 40-1100 MG capsule Take 1 capsule by mouth 2 (two) times daily.   . ondansetron (ZOFRAN) 8 MG tablet Take 1 tablet by mouth as needed.  . polyethylene glycol (MIRALAX / GLYCOLAX) packet Take 17 g by mouth daily as needed.  . simvastatin (ZOCOR) 20 MG tablet TAKE 1 TABLET BY MOUTH EVERYDAY AT BEDTIME  . sucralfate (CARAFATE) 1 g tablet Take 1 g by mouth 4 (four) times daily.   . [DISCONTINUED] Multiple Vitamins-Minerals (MULTIVITAMIN ADULT PO) Take 1 tablet by mouth daily.   No facility-administered medications prior to visit.    Review of Systems  Constitutional: Negative.   HENT: Ear pain: ear fullness.   Respiratory: Negative.   Cardiovascular: Negative.   Musculoskeletal: Negative.        Objective    BP (!) 162/80 (BP Location: Right Arm, Patient Position: Sitting, Cuff Size: Normal)   Pulse (!) 58   Temp (!) 97.1 F (36.2 C) (Temporal)   Wt 121 lb 6.4 oz (55.1 kg)   BMI 23.71 kg/m    Physical Exam   General: Appearance:    Well developed, well nourished female in no acute distress  Ears:    Seborrhea of both ear canals. Small amount of cerumen. Not obstructed            Assessment & Plan     1. Seborrheic dermatitis, unspecified rx- neomycin-polymyxin-hydrocortisone (CORTISPORIN) 3.5-10000-1 OTIC suspension; Place 3 drops into both ears 4 (four) times daily.  Dispense: 10 mL; Refill: 3   Call if symptoms change or if not rapidly improving.         The entirety of the information documented in the History of Present Illness, Review of Systems and Physical Exam were personally obtained by me. Portions of this information were initially documented by the CMA and reviewed by me for thoroughness and accuracy.      Lelon Huh, MD  [facility] [phone] (phone) [phone] (fax)  [place]

## 2019-10-19 ENCOUNTER — Other Ambulatory Visit: Payer: Self-pay | Admitting: Family Medicine

## 2019-10-19 DIAGNOSIS — I10 Essential (primary) hypertension: Secondary | ICD-10-CM

## 2019-10-30 ENCOUNTER — Ambulatory Visit (INDEPENDENT_AMBULATORY_CARE_PROVIDER_SITE_OTHER): Payer: Medicare Other

## 2019-10-30 ENCOUNTER — Other Ambulatory Visit: Payer: Self-pay

## 2019-10-30 DIAGNOSIS — Z0000 Encounter for general adult medical examination without abnormal findings: Secondary | ICD-10-CM

## 2019-10-30 NOTE — Progress Notes (Signed)
Subjective:   Sara Huynh is a 69 y.o. female who presents for Medicare Annual (Subsequent) preventive examination.  I connected with Sara Huynh today by telephone and verified that I am speaking with the correct person using two identifiers. Location patient: home Location provider: work Persons participating in the virtual visit: patient, provider.   I discussed the limitations, risks, security and privacy concerns of performing an evaluation and management service by telephone and the availability of in person appointments. I also discussed with the patient that there may be a patient responsible charge related to this service. The patient expressed understanding and verbally consented to this telephonic visit.    Interactive audio and video telecommunications were attempted between this provider and patient, however failed, due to patient having technical difficulties OR patient did not have access to video capability.  We continued and completed visit with audio only.   Review of Systems    N/A  Cardiac Risk Factors include: advanced age (>29men, >63 women);dyslipidemia;hypertension     Objective:    There were no vitals filed for this visit. There is no height or weight on file to calculate BMI.  Advanced Directives 10/30/2019 10/18/2018 09/16/2017 04/13/2017 08/25/2016 08/21/2015  Does Patient Have a Medical Advance Directive? Yes Yes Yes No Yes Yes  Type of Paramedic of [place];Living will [place];Living will Healthcare Power of [place]  Does patient want to make changes to medical advance directive? - - - - Yes (ED - Information included in AVS) -  Copy of [place] in Chart? No - copy requested No - copy requested No - copy requested - No - copy requested -  Would patient like information on creating a medical advance directive? - - - No - Patient  declined - -    Current Medications (verified) Outpatient Encounter Medications as of 10/30/2019  Medication Sig  . acetaminophen (TYLENOL) 500 MG tablet Take 1,000 mg by mouth every 8 (eight) hours as needed.   Marland Kitchen escitalopram (LEXAPRO) 10 MG tablet TAKE 1 TABLET BY MOUTH EVERY DAY  . lisinopril (ZESTRIL) 10 MG tablet TAKE 1 TABLET BY MOUTH EVERY DAY **REPLACES LISINOPRIL/HCTZ**  . neomycin-polymyxin-hydrocortisone (CORTISPORIN) 3.5-10000-1 OTIC suspension Place 3 drops into both ears 4 (four) times daily.  Marland Kitchen omeprazole-sodium bicarbonate (ZEGERID) 40-1100 MG capsule Take 1 capsule by mouth 2 (two) times daily.   . ondansetron (ZOFRAN) 8 MG tablet Take 1 tablet by mouth as needed.  . polyethylene glycol (MIRALAX / GLYCOLAX) packet Take 17 g by mouth daily as needed.  . simvastatin (ZOCOR) 20 MG tablet TAKE 1 TABLET BY MOUTH EVERYDAY AT BEDTIME  . sucralfate (CARAFATE) 1 g tablet Take 1 g by mouth 4 (four) times daily.    No facility-administered encounter medications on file as of 10/30/2019.    Allergies (verified) Alendronate, Dicyclomine, and Prednisone   History: Past Medical History:  Diagnosis Date  . Anxiety   . Hyperlipidemia   . Hypertension   . Shingles 12/18/2014   Past Surgical History:  Procedure Laterality Date  . ABDOMINAL HYSTERECTOMY  1986  . APPENDECTOMY    . CHOLECYSTECTOMY  03/2009  . ESOPHAGEAL DILATION    . G tube placed for 18 mths   2007  . gi pacemaker  2002  . NASAL SINUS SURGERY  2005  . OOPHORECTOMY    . PYLOROPLASTY  07/15/15   Family History  Problem Relation Age  of Onset  . CAD Mother   . Heart disease Mother        MI  . Dementia Mother   . Alzheimer's disease Mother   . CVA Father   . COPD Father   . Emphysema Father   . Breast cancer Neg Hx    Social History   Socioeconomic History  . Marital status: Married    Spouse name: Not on file  . Number of children: 3  . Years of education: Not on file  . Highest education level: Some  college, no degree  Occupational History  . Occupation: retired  Tobacco Use  . Smoking status: Never Smoker  . Smokeless tobacco: Never Used  Vaping Use  . Vaping Use: Never used  Substance and Sexual Activity  . Alcohol use: No  . Drug use: No  . Sexual activity: Not on file  Other Topics Concern  . Not on file  Social History Narrative  . Not on file   Social Determinants of Health   Financial Resource Strain: Low Risk   . Difficulty of Paying Living Expenses: Not hard at all  Food Insecurity: No Food Insecurity  . Worried About Charity fundraiser in the Last Year: Never true  . Ran Out of Food in the Last Year: Never true  Transportation Needs: No Transportation Needs  . Lack of Transportation (Medical): No  . Lack of Transportation (Non-Medical): No  Physical Activity: Insufficiently Active  . Days of Exercise per Week: 5 days  . Minutes of Exercise per Session: 20 min  Stress: No Stress Concern Present  . Feeling of Stress : Not at all  Social Connections: Moderately Integrated  . Frequency of Communication with Friends and Family: More than three times a week  . Frequency of Social Gatherings with Friends and Family: More than three times a week  . Attends Religious Services: More than 4 times per year  . Active Member of Clubs or Organizations: No  . Attends Archivist Meetings: Never  . Marital Status: Married    Tobacco Counseling Counseling given: Not Answered   Clinical Intake:  Pre-visit preparation completed: Yes  Pain : No/denies pain     Nutritional Risks: None Diabetes: No  How often do you need to have someone help you when you read instructions, pamphlets, or other written materials from your doctor or pharmacy?: 1 - Never  Diabetic? No  Interpreter Needed?: No  Information entered by :: [facility], LPN   Activities of Daily Living In your present state of health, do you have any difficulty performing the following  activities: 10/30/2019  Hearing? N  Vision? N  Difficulty concentrating or making decisions? N  Walking or climbing stairs? N  Dressing or bathing? N  Doing errands, shopping? N  Preparing Food and eating ? N  Using the Toilet? N  In the past six months, have you accidently leaked urine? N  Do you have problems with loss of bowel control? N  Managing your Medications? N  Managing your Finances? N  Housekeeping or managing your Housekeeping? N  Some recent data might be hidden    Patient Care Team: Birdie Sons, MD as PCP - General (Family Medicine) Leandrew Koyanagi, MD as Referring Physician (Ophthalmology) Scherrie November, MD as Referring Physician (Gastroenterology)  Indicate any recent Medical Services you may have received from other than [facility] providers in the past year (date may be approximate).     Assessment:   This is  a routine wellness examination for [place].  Hearing/Vision screen No exam data present  Dietary issues and exercise activities discussed: Current Exercise Habits: Home exercise routine, Type of exercise: walking, Time (Minutes): 25, Frequency (Times/Week): 5, Weekly Exercise (Minutes/Week): 125, Intensity: Mild, Exercise limited by: None identified  Goals    . Increase water intake     Recommend increasing water intake to 4 glasses a day.      Depression Screen PHQ 2/9 Scores 10/30/2019 10/18/2018 09/16/2017 08/25/2016 08/25/2016 08/21/2015  PHQ - 2 Score 0 0 0 0 0 0  PHQ- 9 Score - - - 0 - -    Fall Risk Fall Risk  10/30/2019 01/01/2019 10/18/2018 09/16/2017 08/25/2016  Falls in the past year? 0 0 0 Yes No  Number falls in past yr: 0 0 - 1 -  Injury with Fall? 0 0 - No -  Follow up - - - Falls prevention discussed -    Any stairs in or around the home? No  If so, are there any without handrails? N/A Home free of loose throw rugs in walkways, pet beds, electrical cords, etc? Yes  Adequate lighting in your home to reduce risk of falls? Yes    ASSISTIVE DEVICES UTILIZED TO PREVENT FALLS:  Life alert? No  Use of a cane, walker or w/c? No  Grab bars in the bathroom? No  Shower chair or bench in shower? No  Elevated toilet seat or a handicapped toilet? No    Cognitive Function: Declined today.      6CIT Screen 10/18/2018 08/25/2016  What Year? 0 points 0 points  What month? 0 points 0 points  What time? 0 points 0 points  Count back from 20 0 points 0 points  Months in reverse 0 points 0 points  Repeat phrase 0 points 4 points  Total Score 0 4    Immunizations Immunization History  Administered Date(s) Administered  . Fluad Quad(high Dose 65+) 01/01/2019  . Influenza, High Dose Seasonal PF 01/03/2016, 01/08/2017, 01/28/2018  . Influenza,inj,Quad PF,6+ Mos 01/11/2015  . PFIZER SARS-COV-2 Vaccination 05/25/2019, 06/19/2019  . Pneumococcal Conjugate-13 01/03/2016  . Pneumococcal Polysaccharide-23 01/08/2017  . Td 10/05/2010  . Tdap 10/05/2010    TDAP status: Up to date Flu Vaccine status: Up to date Pneumococcal vaccine status: Up to date Covid-19 vaccine status: Completed vaccines  Qualifies for Shingles Vaccine? Yes   Zostavax completed No   Shingrix Completed?: No.    Education has been provided regarding the importance of this vaccine. Patient has been advised to call insurance company to determine out of pocket expense if they have not yet received this vaccine. Advised may also receive vaccine at local pharmacy or Health Dept. Verbalized acceptance and understanding.  Screening Tests Health Maintenance  Topic Date Due  . DEXA SCAN  11/03/2019  . INFLUENZA VACCINE  11/11/2019  . MAMMOGRAM  01/03/2020  . TETANUS/TDAP  10/04/2020  . COLONOSCOPY  07/11/2024  . COVID-19 Vaccine  Completed  . Hepatitis C Screening  Completed  . PNA vac Low Risk Adult  Completed    Health Maintenance  There are no preventive care reminders to display for this patient.  Colorectal cancer screening: Completed 07/12/19.  Repeat every 5 years Mammogram status: Completed 01/03/19. Repeat every year Bone Density status: Completed 11/02/17. Results reflect: Bone density results: OSTEOPOROSIS. Repeat every 2 years.  Lung Cancer Screening: (Low Dose CT Chest recommended if Age 45-80 years, 30 pack-year currently smoking OR have quit w/in 15years.) does not qualify.  Additional Screening:  Hepatitis C Screening: Up to date  Vision Screening: Recommended annual ophthalmology exams for early detection of glaucoma and other disorders of the eye. Is the patient up to date with their annual eye exam?  Yes  Who is the provider or what is the name of the office in which the patient attends annual eye exams? Dr Wallace Going @ [place] If pt is not established with a provider, would they like to be referred to a provider to establish care? No .   Dental Screening: Recommended annual dental exams for proper oral hygiene  Community Resource Referral / Chronic Care Management: CRR required this visit?  No   CCM required this visit?  No      Plan:     I have personally reviewed and noted the following in the patient's chart:   . Medical and social history . Use of alcohol, tobacco or illicit drugs  . Current medications and supplements . Functional ability and status . Nutritional status . Physical activity . Advanced directives . List of other physicians . Hospitalizations, surgeries, and ER visits in previous 12 months . Vitals . Screenings to include cognitive, depression, and falls . Referrals and appointments  In addition, I have reviewed and discussed with patient certain preventive protocols, quality metrics, and best practice recommendations. A written personalized care plan for preventive services as well as general preventive health recommendations were provided to patient.     Aaira Oestreicher [place], [place]   8/00/6349   Nurse Notes: None.

## 2019-10-30 NOTE — Patient Instructions (Addendum)
[name] , Thank you for taking time to come for your Medicare Wellness Visit. I appreciate your ongoing commitment to your health goals. Please review the following plan we discussed and let me know if I can assist you in the future.   Screening recommendations/referrals: Colonoscopy: Up to date, due 07/2024 Mammogram: Up to date, due 12/2019 Bone Density: Up to date, due in 1 week. Declined referral at this time.  Recommended yearly ophthalmology/optometry visit for glaucoma screening and checkup Recommended yearly dental visit for hygiene and checkup  Vaccinations: Influenza vaccine: Done 01/01/19 Pneumococcal vaccine: Completed series Tdap vaccine: Up to date, due 09/2020 Shingles vaccine: Currently due, declined today.    Advanced directives: Please bring a copy of your POA (Power of Attorney) and/or Living Will to your next appointment.   Conditions/risks identified: Recommend increasing water intake to 6-8 8 oz glasses a day.   Next appointment: 01/23/20 @ 9:00 AM with Dr Caryn Section. Declined scheduling an AWV for 2022 at this time.    Preventive Care 35 Years and Older, Female Preventive care refers to lifestyle choices and visits with your health care provider that can promote health and wellness. What does preventive care include?  A yearly physical exam. This is also called an annual well check.  Dental exams once or twice a year.  Routine eye exams. Ask your health care provider how often you should have your eyes checked.  Personal lifestyle choices, including:  Daily care of your teeth and gums.  Regular physical activity.  Eating a healthy diet.  Avoiding tobacco and drug use.  Limiting alcohol use.  Practicing safe sex.  Taking low-dose aspirin every day.  Taking vitamin and mineral supplements as recommended by your health care provider. What happens during an annual well check? The services and screenings done by your health care provider during your  annual well check will depend on your age, overall health, lifestyle risk factors, and family history of disease. Counseling  Your health care provider may ask you questions about your:  Alcohol use.  Tobacco use.  Drug use.  Emotional well-being.  Home and relationship well-being.  Sexual activity.  Eating habits.  History of falls.  Memory and ability to understand (cognition).  Work and work Statistician.  Reproductive health. Screening  You may have the following tests or measurements:  Height, weight, and BMI.  Blood pressure.  Lipid and cholesterol levels. These may be checked every 5 years, or more frequently if you are over 21 years old.  Skin check.  Lung cancer screening. You may have this screening every year starting at age 69 if you have a 30-pack-year history of smoking and currently smoke or have quit within the past 15 years.  Fecal occult blood test (FOBT) of the stool. You may have this test every year starting at age 35.  Flexible sigmoidoscopy or colonoscopy. You may have a sigmoidoscopy every 5 years or a colonoscopy every 10 years starting at age 69.  Hepatitis C blood test.  Hepatitis B blood test.  Sexually transmitted disease (STD) testing.  Diabetes screening. This is done by checking your blood sugar (glucose) after you have not eaten for a while (fasting). You may have this done every 1-3 years.  Bone density scan. This is done to screen for osteoporosis. You may have this done starting at age 64.  Mammogram. This may be done every 1-2 years. Talk to your health care provider about how often you should have regular mammograms. Talk with  your health care provider about your test results, treatment options, and if necessary, the need for more tests. Vaccines  Your health care provider may recommend certain vaccines, such as:  Influenza vaccine. This is recommended every year.  Tetanus, diphtheria, and acellular pertussis (Tdap, Td)  vaccine. You may need a Td booster every 10 years.  Zoster vaccine. You may need this after age 71.  Pneumococcal 13-valent conjugate (PCV13) vaccine. One dose is recommended after age 27.  Pneumococcal polysaccharide (PPSV23) vaccine. One dose is recommended after age 16. Talk to your health care provider about which screenings and vaccines you need and how often you need them. This information is not intended to replace advice given to you by your health care provider. Make sure you discuss any questions you have with your health care provider. Document Released: 04/25/2015 Document Revised: 12/17/2015 Document Reviewed: 01/28/2015 Elsevier Interactive Patient Education  2017 [place] Prevention in the Home Falls can cause injuries. They can happen to people of all ages. There are many things you can do to make your home safe and to help prevent falls. What can I do on the outside of my home?  Regularly fix the edges of walkways and driveways and fix any cracks.  Remove anything that might make you trip as you walk through a door, such as a raised step or threshold.  Trim any bushes or trees on the path to your home.  Use bright outdoor lighting.  Clear any walking paths of anything that might make someone trip, such as rocks or tools.  Regularly check to see if handrails are loose or broken. Make sure that both sides of any steps have handrails.  Any raised decks and porches should have guardrails on the edges.  Have any leaves, snow, or ice cleared regularly.  Use sand or salt on walking paths during winter.  Clean up any spills in your garage right away. This includes oil or grease spills. What can I do in the bathroom?  Use night lights.  Install grab bars by the toilet and in the tub and shower. Do not use towel bars as grab bars.  Use non-skid mats or decals in the tub or shower.  If you need to sit down in the shower, use a plastic, non-slip  stool.  Keep the floor dry. Clean up any water that spills on the floor as soon as it happens.  Remove soap buildup in the tub or shower regularly.  Attach bath mats securely with double-sided non-slip rug tape.  Do not have throw rugs and other things on the floor that can make you trip. What can I do in the bedroom?  Use night lights.  Make sure that you have a light by your bed that is easy to reach.  Do not use any sheets or blankets that are too big for your bed. They should not hang down onto the floor.  Have a firm chair that has side arms. You can use this for support while you get dressed.  Do not have throw rugs and other things on the floor that can make you trip. What can I do in the kitchen?  Clean up any spills right away.  Avoid walking on wet floors.  Keep items that you use a lot in easy-to-reach places.  If you need to reach something above you, use a strong step stool that has a grab bar.  Keep electrical cords out of the way.  Do not  use floor polish or wax that makes floors slippery. If you must use wax, use non-skid floor wax.  Do not have throw rugs and other things on the floor that can make you trip. What can I do with my stairs?  Do not leave any items on the stairs.  Make sure that there are handrails on both sides of the stairs and use them. Fix handrails that are broken or loose. Make sure that handrails are as long as the stairways.  Check any carpeting to make sure that it is firmly attached to the stairs. Fix any carpet that is loose or worn.  Avoid having throw rugs at the top or bottom of the stairs. If you do have throw rugs, attach them to the floor with carpet tape.  Make sure that you have a light switch at the top of the stairs and the bottom of the stairs. If you do not have them, ask someone to add them for you. What else can I do to help prevent falls?  Wear shoes that:  Do not have high heels.  Have rubber bottoms.  Are  comfortable and fit you well.  Are closed at the toe. Do not wear sandals.  If you use a stepladder:  Make sure that it is fully opened. Do not climb a closed stepladder.  Make sure that both sides of the stepladder are locked into place.  Ask someone to hold it for you, if possible.  Clearly mark and make sure that you can see:  Any grab bars or handrails.  First and last steps.  Where the edge of each step is.  Use tools that help you move around (mobility aids) if they are needed. These include:  Canes.  Walkers.  Scooters.  Crutches.  Turn on the lights when you go into a dark area. Replace any light bulbs as soon as they burn out.  Set up your furniture so you have a clear path. Avoid moving your furniture around.  If any of your floors are uneven, fix them.  If there are any pets around you, be aware of where they are.  Review your medicines with your doctor. Some medicines can make you feel dizzy. This can increase your chance of falling. Ask your doctor what other things that you can do to help prevent falls. This information is not intended to replace advice given to you by your health care provider. Make sure you discuss any questions you have with your health care provider. Document Released: 01/23/2009 Document Revised: 09/04/2015 Document Reviewed: 05/03/2014 Elsevier Interactive Patient Education  2017 [employer].

## 2019-12-29 ENCOUNTER — Other Ambulatory Visit: Payer: Self-pay | Admitting: Family Medicine

## 2020-01-22 NOTE — Progress Notes (Signed)
Established patient visit   Patient: Sara Huynh   DOB: Jan 16, 1951   69 y.o. Female  MRN: [id number] Visit Date: 01/23/2020  Today's healthcare provider: Lelon Huh, MD   Chief Complaint  Patient presents with  . Hyperlipidemia  . Hypertension  . Vitamin D deficiency    Subjective    HPI  Hypertension, follow-up  BP Readings from Last 3 Encounters:  01/23/20 122/68  09/26/19 (!) 162/80  01/01/19 138/82   Wt Readings from Last 3 Encounters:  01/23/20 123 lb 9.6 oz (56.1 kg)  09/26/19 121 lb 6.4 oz (55.1 kg)  01/01/19 124 lb 6.4 oz (56.4 kg)     She was last seen for hypertension 1 years ago.  BP at that visit was 138/82. Management since that visit includes no change.  She reports good compliance with treatment. She is not having side effects.  She is following a Regular diet. She is exercising. She does not smoke.  Use of agents associated with hypertension: none.   Symptoms: No chest pain No chest pressure  No palpitations No syncope  No dyspnea No orthopnea  No paroxysmal nocturnal dyspnea No lower extremity edema   Pertinent labs: Lab Results  Component Value Date   CHOL 206 (H) 01/01/2019   HDL 50 01/01/2019   LDLCALC 94 01/01/2019   TRIG 377 (H) 01/01/2019   CHOLHDL 4.1 01/01/2019   Lab Results  Component Value Date   NA 143 01/01/2019   K 4.4 01/01/2019   CREATININE 1.14 (H) 01/01/2019   GFRNONAA 50 (L) 01/01/2019   GFRAA 57 (L) 01/01/2019   GLUCOSE 88 01/01/2019     The 10-year ASCVD risk score Mikey Bussing DC Jr., et al., 2013) is: 10.7%   --------------------------------------------------------------------------------------------------- Lipid/Cholesterol, Follow-up  Last lipid panel Other pertinent labs  Lab Results  Component Value Date   CHOL 206 (H) 01/01/2019   HDL 50 01/01/2019   LDLCALC 94 01/01/2019   TRIG 377 (H) 01/01/2019   CHOLHDL 4.1 01/01/2019   Lab Results  Component Value Date   ALT 17 01/01/2019   AST 20  01/01/2019   PLT 248 08/18/2017   TSH 3.850 10/22/2016     She was last seen for this 1 years ago.  Management since that visit includes no changes.  She reports good compliance with treatment. She is not having side effects.   Symptoms: No chest pain No chest pressure/discomfort  No dyspnea No lower extremity edema  No numbness or tingling of extremity No orthopnea  No palpitations No paroxysmal nocturnal dyspnea  No speech difficulty No syncope   Current diet: in general, a "healthy" diet    Current exercise: walking  The 10-year ASCVD risk score Mikey Bussing DC Jr., et al., 2013) is: 10.7%  --------------------------------------------------------------------------------------------------- Vitamin D deficiency, follow-up  Lab Results  Component Value Date   VD25OH 59.3 01/01/2019   VD25OH 40.6 09/16/2017   VD25OH 36.0 08/21/2015   CALCIUM 9.8 01/01/2019   CALCIUM 9.5 08/18/2017  ) Wt Readings from Last 3 Encounters:  01/23/20 123 lb 9.6 oz (56.1 kg)  09/26/19 121 lb 6.4 oz (55.1 kg)  01/01/19 124 lb 6.4 oz (56.4 kg)    She was last seen for vitamin D deficiency 1 years ago.  Management since that visit includes no change. She reports good compliance with treatment. She is not having side effects.   Symptoms: No change in energy level No numbness or tingling  No bone pain No unexplained fracture   ---------------------------------------------------------------------------------------------------  Medications: Outpatient Medications Prior to Visit  Medication Sig  . acetaminophen (TYLENOL) 500 MG tablet Take 1,000 mg by mouth every 8 (eight) hours as needed.   . Cholecalciferol (VITAMIN D3) 125 MCG (5000 UT) CAPS Take by mouth.  . escitalopram (LEXAPRO) 10 MG tablet TAKE 1 TABLET BY MOUTH EVERY DAY  . lisinopril (ZESTRIL) 10 MG tablet TAKE 1 TABLET BY MOUTH EVERY DAY **REPLACES LISINOPRIL/HCTZ**  . omeprazole-sodium bicarbonate (ZEGERID) 40-1100 MG capsule  Take 1 capsule by mouth 2 (two) times daily.   . ondansetron (ZOFRAN) 8 MG tablet Take 1 tablet by mouth as needed.  . polyethylene glycol (MIRALAX / GLYCOLAX) packet Take 17 g by mouth daily as needed.  . sucralfate (CARAFATE) 1 g tablet Take 1 g by mouth 4 (four) times daily.   . simvastatin (ZOCOR) 20 MG tablet TAKE 1 TABLET BY MOUTH EVERYDAY AT BEDTIME   No facility-administered medications prior to visit.    Review of Systems  Constitutional: Negative.   Respiratory: Negative.   Cardiovascular: Negative.   Gastrointestinal: Positive for nausea and vomiting.  Endocrine: Positive for cold intolerance.  Musculoskeletal: Negative.       Objective    BP 122/68 (BP Location: Left Arm, Patient Position: Sitting, Cuff Size: Normal)   Pulse 64   Temp 98.4 F (36.9 C) (Oral)   Ht 5' (1.524 m)   Wt 123 lb 9.6 oz (56.1 kg)   SpO2 100%   BMI 24.14 kg/m    Physical Exam   General: Appearance:    Well developed, well nourished female in no acute distress  Eyes:    PERRL, conjunctiva/corneas clear, EOM's intact       Lungs:     Clear to auscultation bilaterally, respirations unlabored  Heart:    Normal heart rate. Normal rhythm. No murmurs, rubs, or gallops.   MS:   All extremities are intact.   Neurologic:   Awake, alert, oriented x 3. No apparent focal neurological           defect.        No results found for any visits on 01/23/20.  Assessment & Plan     1. Osteoporosis, unspecified osteoporosis type, unspecified pathological fracture presence Recommended BMD which she declined, but would consider having done next year. Would be hesitant to prescription bisphosphonate due to systemic sclerosis. Reminded of importance of normalizing vitamin D levels.   2. Essential hypertension Well controlled.  Continue current medications.    3. Avitaminosis D Is on 5,000 units a day.  - VITAMIN D 25 Hydroxy (Vit-D Deficiency, Fractures)  4. Hypercholesteremia She is tolerating  simvastatin well with no adverse effects.   - CBC - Comprehensive metabolic panel - Lipid panel - simvastatin (ZOCOR) 20 MG tablet; TAKE 1 TABLET BY MOUTH EVERYDAY AT BEDTIME  Dispense: 90 tablet; Refill: 3  5. Systemic sclerosis (HCC) Stable   6. Need for influenza vaccination  - Flu Vaccine QUAD High Dose(Fluad)  7. Breast cancer screening by mammogram mammogram scheduled at [facility]   No follow-ups on file.      The entirety of the information documented in the History of Present Illness, Review of Systems and Physical Exam were personally obtained by me. Portions of this information were initially documented by the CMA and reviewed by me for thoroughness and accuracy.      Lelon Huh, MD  [facility] [phone] (phone) [phone] (fax)  [place]

## 2020-01-23 ENCOUNTER — Encounter: Payer: Self-pay | Admitting: Family Medicine

## 2020-01-23 ENCOUNTER — Ambulatory Visit (INDEPENDENT_AMBULATORY_CARE_PROVIDER_SITE_OTHER): Payer: Medicare Other | Admitting: Family Medicine

## 2020-01-23 ENCOUNTER — Other Ambulatory Visit: Payer: Self-pay

## 2020-01-23 VITALS — BP 122/68 | HR 64 | Temp 98.4°F | Ht 60.0 in | Wt 123.6 lb

## 2020-01-23 DIAGNOSIS — I10 Essential (primary) hypertension: Secondary | ICD-10-CM | POA: Diagnosis not present

## 2020-01-23 DIAGNOSIS — M349 Systemic sclerosis, unspecified: Secondary | ICD-10-CM

## 2020-01-23 DIAGNOSIS — Z23 Encounter for immunization: Secondary | ICD-10-CM | POA: Diagnosis not present

## 2020-01-23 DIAGNOSIS — E7800 Pure hypercholesterolemia, unspecified: Secondary | ICD-10-CM

## 2020-01-23 DIAGNOSIS — E559 Vitamin D deficiency, unspecified: Secondary | ICD-10-CM | POA: Diagnosis not present

## 2020-01-23 DIAGNOSIS — M810 Age-related osteoporosis without current pathological fracture: Secondary | ICD-10-CM | POA: Diagnosis not present

## 2020-01-23 DIAGNOSIS — Z1231 Encounter for screening mammogram for malignant neoplasm of breast: Secondary | ICD-10-CM

## 2020-01-23 MED ORDER — SIMVASTATIN 20 MG PO TABS: ORAL_TABLET | ORAL | 3 refills | Status: DC

## 2020-01-23 NOTE — Patient Instructions (Addendum)
The CDC recommends two doses of Shingrix (the shingles vaccine) separated by 2 to 6 months for adults age 69 years and older. I recommend checking with your pharmacy plan regarding coverage for this vaccine.   . You are due for a Td (tetanus-diptheria-vaccine) which protects you from tetanus and diptheria. Please check with your insurance plan or pharmacy regarding coverage for this vaccine.

## 2020-01-24 LAB — CBC
Hematocrit: 39.8 % (ref 34.0–46.6)
Hemoglobin: 12.8 g/dL (ref 11.1–15.9)
MCH: 28.3 pg (ref 26.6–33.0)
MCHC: 32.2 g/dL (ref 31.5–35.7)
MCV: 88 fL (ref 79–97)
Platelets: 228 10*3/uL (ref 150–450)
RBC: 4.53 x10E6/uL (ref 3.77–5.28)
RDW: 13.6 % (ref 11.7–15.4)
WBC: 8.1 10*3/uL (ref 3.4–10.8)

## 2020-01-24 LAB — COMPREHENSIVE METABOLIC PANEL
ALT: 21 IU/L (ref 0–32)
AST: 24 IU/L (ref 0–40)
Albumin/Globulin Ratio: 2.1 (ref 1.2–2.2)
Albumin: 4.8 g/dL (ref 3.8–4.8)
Alkaline Phosphatase: 70 IU/L (ref 44–121)
BUN/Creatinine Ratio: 10 — ABNORMAL LOW (ref 12–28)
BUN: 11 mg/dL (ref 8–27)
Bilirubin Total: 0.9 mg/dL (ref 0.0–1.2)
CO2: 25 mmol/L (ref 20–29)
Calcium: 9.8 mg/dL (ref 8.7–10.3)
Chloride: 100 mmol/L (ref 96–106)
Creatinine, Ser: 1.09 mg/dL — ABNORMAL HIGH (ref 0.57–1.00)
GFR calc Af Amer: 60 mL/min/{1.73_m2} (ref >59)
GFR calc non Af Amer: 52 mL/min/{1.73_m2} — ABNORMAL LOW (ref >59)
Globulin, Total: 2.3 g/dL (ref 1.5–4.5)
Glucose: 85 mg/dL (ref 65–99)
Potassium: 4.4 mmol/L (ref 3.5–5.2)
Sodium: 142 mmol/L (ref 134–144)
Total Protein: 7.1 g/dL (ref 6.0–8.5)

## 2020-01-24 LAB — LIPID PANEL
Chol/HDL Ratio: 4.5 ratio — ABNORMAL HIGH (ref 0.0–4.4)
Cholesterol, Total: 221 mg/dL — ABNORMAL HIGH (ref 100–199)
HDL: 49 mg/dL (ref >39)
LDL Chol Calc (NIH): 105 mg/dL — ABNORMAL HIGH (ref 0–99)
Triglycerides: 398 mg/dL — ABNORMAL HIGH (ref 0–149)
VLDL Cholesterol Cal: 67 mg/dL — ABNORMAL HIGH (ref 5–40)

## 2020-01-24 LAB — VITAMIN D 25 HYDROXY (VIT D DEFICIENCY, FRACTURES): Vit D, 25-Hydroxy: 27.2 ng/mL — ABNORMAL LOW (ref 30.0–100.0)

## 2020-02-11 DIAGNOSIS — Z1231 Encounter for screening mammogram for malignant neoplasm of breast: Secondary | ICD-10-CM | POA: Diagnosis not present

## 2020-02-11 LAB — HM MAMMOGRAPHY

## 2020-04-19 ENCOUNTER — Other Ambulatory Visit: Payer: Self-pay | Admitting: Family Medicine

## 2020-04-19 DIAGNOSIS — I10 Essential (primary) hypertension: Secondary | ICD-10-CM

## 2020-04-20 NOTE — Telephone Encounter (Signed)
Requested medication (s) are due for refill today: yes  Requested medication (s) are on the active medication list: yes  Last refill:  10/19/19  Future visit scheduled: no  Notes to clinic:  sent message to pt to call for appt via [place].   Requested Prescriptions  Pending Prescriptions Disp Refills   lisinopril (ZESTRIL) 10 MG tablet [Pharmacy Med Name: LISINOPRIL 10 MG TABLET] 90 tablet 1    Sig: TAKE 1 TABLET BY MOUTH EVERY DAY **REPLACES LISINOPRIL/HCTZ**      Cardiovascular:  ACE Inhibitors Failed - 04/19/2020  9:21 AM      Failed - Cr in normal range and within 180 days    Creatinine  Date Value Ref Range Status  07/16/2012 0.97 0.60 - 1.30 mg/dL Final   Creatinine, Ser  Date Value Ref Range Status  01/23/2020 1.09 (H) 0.57 - 1.00 mg/dL Final          Failed - Valid encounter within last 6 months    Recent Outpatient Visits           2 months ago Need for influenza vaccination   [facility] Birdie Sons, MD   6 months ago Seborrheic dermatitis, unspecified   [facility] Birdie Sons, MD   1 year ago Need for influenza vaccination   [facility] Birdie Sons, MD   2 years ago Essential hypertension   [facility] Birdie Sons, MD   2 years ago [place], Donald E, MD                Passed - K in normal range and within 180 days    Potassium  Date Value Ref Range Status  01/23/2020 4.4 3.5 - 5.2 mmol/L Final  07/16/2012 3.2 (L) 3.5 - 5.1 mmol/L Final          Passed - Patient is not pregnant      Passed - Last BP in normal range    BP Readings from Last 1 Encounters:  01/23/20 122/68

## 2020-04-22 ENCOUNTER — Encounter: Payer: Self-pay | Admitting: Family Medicine

## 2020-04-22 ENCOUNTER — Telehealth (INDEPENDENT_AMBULATORY_CARE_PROVIDER_SITE_OTHER): Payer: Medicare Other | Admitting: Family Medicine

## 2020-04-22 ENCOUNTER — Other Ambulatory Visit: Payer: Self-pay

## 2020-04-22 VITALS — Temp 97.5°F

## 2020-04-22 DIAGNOSIS — R0981 Nasal congestion: Secondary | ICD-10-CM | POA: Diagnosis not present

## 2020-04-22 DIAGNOSIS — R5383 Other fatigue: Secondary | ICD-10-CM

## 2020-04-22 DIAGNOSIS — R5381 Other malaise: Secondary | ICD-10-CM | POA: Diagnosis not present

## 2020-04-22 MED ORDER — AMOXICILLIN 875 MG PO TABS: 875.0000 mg | ORAL_TABLET | Freq: Two times a day (BID) | ORAL | 0 refills | Status: DC

## 2020-04-22 NOTE — Progress Notes (Signed)
Telepone Visit    Virtual Visit via Telephone Note   This visit type was conducted due to national recommendations for restrictions regarding the COVID-19 Pandemic (e.g. social distancing) in an effort to limit this patient's exposure and mitigate transmission in our community. This patient is at least at moderate risk for complications without adequate follow up. This format is felt to be most appropriate for this patient at this time. Physical exam was limited by quality of the audio technology used for the visit.   Patient location: home Provider location: office  I discussed the limitations of evaluation and management by telemedicine and the availability of in person appointments. The patient expressed understanding and agreed to proceed.  Patient: Sara Huynh   DOB: [birth year]   70 y.o. Female  MRN: [id number] Visit Date: 04/22/2020  Today's healthcare provider: Vernie Murders, PA-C   No chief complaint on file.  Subjective    HPI  Patient is a 70 year old female who present via video visit for symptoms of cold/covid.  She states they began on 04/13/20.  She has been using Benadryl, and Tussin DM.  Cough syrup has not helped and cough has kept her awake some during the night.  She denies any loss of taste or smell.  It is of note her husband is sick with the same symptoms.   Past Medical History:  Diagnosis Date  . Anxiety   . Hyperlipidemia   . Hypertension   . Shingles 12/18/2014   Past Surgical History:  Procedure Laterality Date  . ABDOMINAL HYSTERECTOMY  1986  . APPENDECTOMY    . CHOLECYSTECTOMY  03/2009  . ESOPHAGEAL DILATION    . G tube placed for 18 mths   2007  . gi pacemaker  2002  . NASAL SINUS SURGERY  2005  . OOPHORECTOMY    . PYLOROPLASTY  07/15/15   Social History   Tobacco Use  . Smoking status: Never Smoker  . Smokeless tobacco: Never Used  Vaping Use  . Vaping Use: Never used  Substance Use Topics  . Alcohol use: No  . Drug use: No    Family History  Problem Relation Age of Onset  . CAD Mother   . Heart disease Mother        MI  . Dementia Mother   . Alzheimer's disease Mother   . CVA Father   . COPD Father   . Emphysema Father   . Breast cancer Neg Hx    Allergies  Allergen Reactions  . Alendronate     Worsening reflux with history of scleroderma  . Dicyclomine Other (See Comments)    Double vision  . Prednisone Itching, Other (See Comments) and Rash    Other reaction(s): Other (See Comments) Makes skin crawl and make pt aggitated Makes skin crawl and make pt aggitated Makes skin crawl and make pt aggitated       Medications: Outpatient Medications Prior to Visit  Medication Sig  . acetaminophen (TYLENOL) 500 MG tablet Take 1,000 mg by mouth every 8 (eight) hours as needed.   . Cholecalciferol (VITAMIN D3) 125 MCG (5000 UT) CAPS Take by mouth.  . escitalopram (LEXAPRO) 10 MG tablet TAKE 1 TABLET BY MOUTH EVERY DAY  . lisinopril (ZESTRIL) 10 MG tablet TAKE 1 TABLET BY MOUTH EVERY DAY **REPLACES LISINOPRIL/HCTZ**  . omeprazole-sodium bicarbonate (ZEGERID) 40-1100 MG capsule Take 1 capsule by mouth 2 (two) times daily.   . ondansetron (ZOFRAN) 8 MG tablet Take 1 tablet  by mouth as needed.  . polyethylene glycol (MIRALAX / GLYCOLAX) packet Take 17 g by mouth daily as needed.  . simvastatin (ZOCOR) 20 MG tablet TAKE 1 TABLET BY MOUTH EVERYDAY AT BEDTIME  . sucralfate (CARAFATE) 1 g tablet Take 1 g by mouth 4 (four) times daily.    No facility-administered medications prior to visit.    Review of Systems  Constitutional: Positive for chills, diaphoresis, fatigue and fever.  HENT: Positive for congestion, postnasal drip, rhinorrhea and sneezing. Negative for ear discharge, ear pain, facial swelling, sinus pressure, sinus pain, sore throat and tinnitus.   Respiratory: Positive for cough (non productive). Negative for shortness of breath and wheezing.   Cardiovascular: Negative for chest pain and leg  swelling.  Gastrointestinal: Negative for abdominal pain, diarrhea, nausea and vomiting.  Musculoskeletal: Positive for myalgias.  Neurological: Positive for headaches. Negative for dizziness.      Objective    Temp (!) 97.5 F (36.4 C)    During telephonic interview, no acute respiratory distress. Some congested cough occasionally.    Assessment & Plan     1. Sinus congestion Headache, PND, sinus pressure, chills and body aches since 04-13-20. Some help with Tylenol, Tussin-DM and Benadryl. Check COVID and influenza test. Start Amoxil for any secondary bacterial sinusitis. Increase fluid intake. Has had COVID vaccinations Feb.and Mar. 2021. Follow COVID restrictions and recheck prn. - Coronavirus (COVID-19) with Influenza A and Influenza B - amoxicillin (AMOXIL) 875 MG tablet; Take 1 tablet (875 mg total) by mouth 2 (two) times daily.  Dispense: 20 tablet; Refill: 0  2. Malaise and fatigue Similar symptoms and fatigue as when she and her husband had COVID in November 2020. Check COViD and influenza test. Continue Tussin-DM and Benadryl prn symptoms. Increase fluid intake and use OTC meds for symptoms. Continue to quarantine and follow COVID restrictions. - Coronavirus (COVID-19) with Influenza A and Influenza B   No follow-ups on file.     I discussed the assessment and treatment plan with the patient. The patient was provided an opportunity to ask questions and all were answered. The patient agreed with the plan and demonstrated an understanding of the instructions.   The patient was advised to call back or seek an in-person evaluation if the symptoms worsen or if the condition fails to improve as anticipated.  I provided 20 minutes of non-face-to-face time during this encounter.  I, Dennis Chrismon, PA-C, have reviewed all documentation for this visit. The documentation on 04/22/20 for the exam, diagnosis, procedures, and orders are all accurate and complete.   Vernie Murders, PA-C [employer] [phone] (phone) [phone] (fax)  [place]

## 2020-04-26 LAB — COVID-19, FLU A+B NAA
Influenza A, NAA: NOT DETECTED
Influenza B, NAA: NOT DETECTED
SARS-CoV-2, NAA: DETECTED — AB

## 2020-06-16 ENCOUNTER — Other Ambulatory Visit: Payer: Self-pay | Admitting: Family Medicine

## 2020-06-16 NOTE — Telephone Encounter (Signed)
Requested medication (s) are due for refill today: yes  Requested medication (s) are on the active medication list: yes  Last refill: 03/25/2020  Future visit scheduled: no  Notes to clinic: overdue for a follow up appointment   Requested Prescriptions  Pending Prescriptions Disp Refills   escitalopram (LEXAPRO) 10 MG tablet [Pharmacy Med Name: ESCITALOPRAM 10 MG TABLET] 90 tablet 4    Sig: TAKE 1 [place]      Psychiatry:  Antidepressants - SSRI Failed - 06/16/2020 10:02 AM      Failed - Valid encounter within last 6 months    Recent Outpatient Visits           1 month ago Sinus congestion   [place], PA-C   4 months ago Need for influenza vaccination   [facility] Birdie Sons, MD   8 months ago Seborrheic dermatitis, unspecified   [facility] Birdie Sons, MD   1 year ago Need for influenza vaccination   [facility] Birdie Sons, MD   2 years ago Essential hypertension   [facility] Birdie Sons, MD

## 2020-06-16 NOTE — Telephone Encounter (Signed)
Pt called and I discussed qualifications for med Rx. Pt has been scheduled for OV tomorrow and said she has enough Lexipro pills to last until then so I did not perform a courtesy refill.

## 2020-06-17 ENCOUNTER — Encounter: Payer: Self-pay | Admitting: Family Medicine

## 2020-06-17 ENCOUNTER — Ambulatory Visit (INDEPENDENT_AMBULATORY_CARE_PROVIDER_SITE_OTHER): Payer: Medicare Other | Admitting: Family Medicine

## 2020-06-17 ENCOUNTER — Other Ambulatory Visit: Payer: Self-pay

## 2020-06-17 VITALS — BP 175/91 | HR 58 | Temp 98.1°F | Resp 18 | Wt 123.0 lb

## 2020-06-17 DIAGNOSIS — M810 Age-related osteoporosis without current pathological fracture: Secondary | ICD-10-CM | POA: Diagnosis not present

## 2020-06-17 DIAGNOSIS — E559 Vitamin D deficiency, unspecified: Secondary | ICD-10-CM

## 2020-06-17 DIAGNOSIS — F439 Reaction to severe stress, unspecified: Secondary | ICD-10-CM

## 2020-06-17 DIAGNOSIS — F5104 Psychophysiologic insomnia: Secondary | ICD-10-CM

## 2020-06-17 DIAGNOSIS — R519 Headache, unspecified: Secondary | ICD-10-CM

## 2020-06-17 DIAGNOSIS — I10 Essential (primary) hypertension: Secondary | ICD-10-CM | POA: Diagnosis not present

## 2020-06-17 MED ORDER — TRAZODONE HCL 50 MG PO TABS: 50.0000 mg | ORAL_TABLET | Freq: Every evening | ORAL | 3 refills | Status: DC | PRN

## 2020-06-17 MED ORDER — AMLODIPINE BESYLATE 2.5 MG PO TABS: 2.5000 mg | ORAL_TABLET | Freq: Every evening | ORAL | 4 refills | Status: DC

## 2020-06-17 MED ORDER — ESCITALOPRAM OXALATE 10 MG PO TABS: 10.0000 mg | ORAL_TABLET | Freq: Every day | ORAL | 4 refills | Status: DC

## 2020-06-17 NOTE — Progress Notes (Signed)
Established patient visit   Patient: Sara Huynh   DOB: July 11, 1950   70 y.o. Female  MRN: [id number] Visit Date: 06/17/2020  Today's healthcare Sara Huynh: Sara Huh, MD   Chief Complaint  Patient presents with  . Depression  . Hypertension   Subjective    HPI  Follow up for Vitamin D deficiency:  The patient was last seen for this 4 months ago. Changes made at last visit include double the dose of vitamin D3. Lab Results  Component Value Date   VD25OH 27.2 (L) 01/23/2020    She reports fair compliance with treatment. Patient states she was unaware that she was supposed to double up on Vitamin D. She feels that condition is Unchanged. She is not having side effects.   -----------------------------------------------------------------------------------------  Depression, Follow-up  She  was last seen for this more than 3 months ago.   Changes made at last visit include none. Current treatment includes Lezapro 10mg  daily.    She reports good compliance with treatment. She is not having side effects.   She reports good tolerance of treatment. Current symptoms include: depressed mood (due to husbands declining health) She feels she is Worse since last visit.  Depression screen [facility] 2/9 06/17/2020 10/30/2019 10/18/2018  Decreased Interest 0 0 0  Down, Depressed, Hopeless 0 0 0  PHQ - 2 Score 0 0 0  Altered sleeping 2 - -  Tired, decreased energy 0 - -  Change in appetite 0 - -  Feeling bad or failure about yourself  0 - -  Trouble concentrating 0 - -  Moving slowly or fidgety/restless 0 - -  Suicidal thoughts 0 - -  PHQ-9 Score 2 - -  Difficult doing work/chores Not difficult at all - -    -----------------------------------------------------------------------------------------  Hypertension, follow-up  BP Readings from Last 3 Encounters:  06/17/20 (!) 175/91  01/23/20 122/68  09/26/19 (!) 162/80   Wt Readings from Last 3 Encounters:  06/17/20 123 lb  (55.8 kg)  01/23/20 123 lb 9.6 oz (56.1 kg)  09/26/19 121 lb 6.4 oz (55.1 kg)     She was last seen for hypertension 4 months ago.  BP at that visit was 122/68. Management since that visit includes continuing same medications.  She reports good compliance with treatment. She is not having side effects.  She is following a Regular diet. She is exercising. She does not smoke.  Use of agents associated with hypertension: none.   Outside blood pressures are 180/80.  She states she has been under more stress lately and is having headaches off and on nearly every day. Her husband has a recurrence of lung cancer which has her very worried, and is also having trouble sleeping at night due to this stress.  ---------------------------------------------------------------------------------------------------       Medications: Outpatient Medications Prior to Visit  Medication Sig  . acetaminophen (TYLENOL) 500 MG tablet Take 1,000 mg by mouth every 8 (eight) hours as needed.   . Cholecalciferol (VITAMIN D3) 125 MCG (5000 UT) CAPS Take by mouth.  . escitalopram (LEXAPRO) 10 MG tablet TAKE 1 TABLET BY MOUTH EVERY DAY  . lisinopril (ZESTRIL) 10 MG tablet TAKE 1 TABLET BY MOUTH EVERY DAY **REPLACES LISINOPRIL/HCTZ**  . omeprazole-sodium bicarbonate (ZEGERID) 40-1100 MG capsule Take 1 capsule by mouth 2 (two) times daily.   . ondansetron (ZOFRAN) 8 MG tablet Take 1 tablet by mouth as needed.  . polyethylene glycol (MIRALAX / GLYCOLAX) packet Take 17 g by mouth  daily as needed.  . simvastatin (ZOCOR) 20 MG tablet TAKE 1 TABLET BY MOUTH EVERYDAY AT BEDTIME  . sucralfate (CARAFATE) 1 g tablet Take 1 g by mouth 4 (four) times daily.   . [DISCONTINUED] amoxicillin (AMOXIL) 875 MG tablet Take 1 tablet (875 mg total) by mouth 2 (two) times daily.   No facility-administered medications prior to visit.    Review of Systems  Constitutional: Negative for appetite change, chills, fatigue and fever.   Respiratory: Negative for chest tightness and shortness of breath.   Cardiovascular: Positive for palpitations (occasionally). Negative for chest pain.  Gastrointestinal: Negative for abdominal pain, nausea and vomiting.  Neurological: Positive for headaches. Negative for dizziness and weakness.  Psychiatric/Behavioral: Positive for dysphoric mood. The patient is nervous/anxious.        Objective    BP (!) 175/91 (BP Location: Left Arm, Patient Position: Sitting, Cuff Size: Normal)   Pulse (!) 58   Temp 98.1 F (36.7 C) (Temporal)   Resp 18   Wt 123 lb (55.8 kg)   BMI 24.02 kg/m     Physical Exam    General: Appearance:    Well developed, well nourished female in no acute distress  Eyes:    PERRL, conjunctiva/corneas clear, EOM's intact       Lungs:     Clear to auscultation bilaterally, respirations unlabored  Heart:    Bradycardic. Normal rhythm. No murmurs, rubs, or gallops.   MS:   All extremities are intact.   Neurologic:   Awake, alert, oriented x 3. No apparent focal neurological           defect.        Assessment & Plan     1. Avitaminosis D Is still only taking one tablet of her vitamin D supplement daily.  - VITAMIN D 25 Hydroxy (Vit-D Deficiency, Fractures)  2. Osteoporosis, unspecified osteoporosis type, unspecified pathological fracture presence Is due for - DG Bone Density [place]; Future  3. Frequent headaches Likely due to variable blood pressure. We are adding amlodipine for bp as below.   4. Situational stress Worse with recurrance of her husbands lung cancer. Will continue escitalopram and add trazodone as below.  - escitalopram (LEXAPRO) 10 MG tablet; Take 1 tablet (10 mg total) by mouth daily.  Dispense: 90 tablet; Refill: 4  5. Psychophysiological insomnia add- traZODone (DESYREL) 50 MG tablet; Take 1-2 tablets (50-100 mg total) by mouth at bedtime as needed for sleep.  Dispense: 30 tablet; Refill: 3  6. Essential hypertension Not at goal,  likely exacerbated by anxiety  - Renal function panel add- amLODipine (NORVASC) 2.5 MG tablet; Take 1 tablet (2.5 mg total) by mouth every evening.  Dispense: 30 tablet; Refill: 4   Recheck BP in about 4 weeks.        The entirety of the information documented in the History of Present Illness, Review of Systems and Physical Exam were personally obtained by me. Portions of this information were initially documented by the CMA and reviewed by me for thoroughness and accuracy.      Sara Huh, MD  [facility] [phone] (phone) [phone] (fax)  [place]

## 2020-06-18 ENCOUNTER — Telehealth: Payer: Self-pay

## 2020-06-18 LAB — RENAL FUNCTION PANEL
Albumin: 4.5 g/dL (ref 3.8–4.8)
BUN/Creatinine Ratio: 6 — ABNORMAL LOW (ref 12–28)
BUN: 6 mg/dL — ABNORMAL LOW (ref 8–27)
CO2: 24 mmol/L (ref 20–29)
Calcium: 9.7 mg/dL (ref 8.7–10.3)
Chloride: 102 mmol/L (ref 96–106)
Creatinine, Ser: 1.04 mg/dL — ABNORMAL HIGH (ref 0.57–1.00)
Glucose: 108 mg/dL — ABNORMAL HIGH (ref 65–99)
Phosphorus: 3.9 mg/dL (ref 3.0–4.3)
Potassium: 4.8 mmol/L (ref 3.5–5.2)
Sodium: 143 mmol/L (ref 134–144)
eGFR: 58 mL/min/{1.73_m2} — ABNORMAL LOW (ref >59)

## 2020-06-18 LAB — VITAMIN D 25 HYDROXY (VIT D DEFICIENCY, FRACTURES): Vit D, 25-Hydroxy: 29.6 ng/mL — ABNORMAL LOW (ref 30.0–100.0)

## 2020-06-18 NOTE — Telephone Encounter (Signed)
-----   Message from Birdie Sons, MD sent at 06/18/2020  7:46 AM EST ----- Her vitamin d levels are low. Please have verify what dose of vitamin d she is taking. She needs to double it. Rest of labs are good. Follow up for bp check in April as scheduled.

## 2020-06-18 NOTE — Telephone Encounter (Signed)
I called pt and pt verbalized understanding of information below. Pt is taking 1,000 units of Vit D. I advised pt to double this per instruction below.

## 2020-07-15 ENCOUNTER — Ambulatory Visit: Payer: Self-pay | Admitting: Family Medicine

## 2020-08-19 ENCOUNTER — Other Ambulatory Visit: Payer: Medicare Other

## 2020-10-22 ENCOUNTER — Other Ambulatory Visit: Payer: Self-pay | Admitting: Family Medicine

## 2020-10-22 DIAGNOSIS — I10 Essential (primary) hypertension: Secondary | ICD-10-CM

## 2020-11-11 ENCOUNTER — Other Ambulatory Visit: Payer: Self-pay | Admitting: Family Medicine

## 2020-11-11 DIAGNOSIS — I10 Essential (primary) hypertension: Secondary | ICD-10-CM

## 2020-11-12 DIAGNOSIS — R110 Nausea: Secondary | ICD-10-CM | POA: Diagnosis not present

## 2020-11-12 DIAGNOSIS — R6881 Early satiety: Secondary | ICD-10-CM | POA: Diagnosis not present

## 2020-11-12 DIAGNOSIS — K3184 Gastroparesis: Secondary | ICD-10-CM | POA: Diagnosis not present

## 2020-11-12 DIAGNOSIS — R112 Nausea with vomiting, unspecified: Secondary | ICD-10-CM | POA: Diagnosis not present

## 2020-11-12 DIAGNOSIS — Z79899 Other long term (current) drug therapy: Secondary | ICD-10-CM | POA: Diagnosis not present

## 2020-11-20 DIAGNOSIS — R110 Nausea: Secondary | ICD-10-CM | POA: Diagnosis not present

## 2020-11-20 DIAGNOSIS — K3189 Other diseases of stomach and duodenum: Secondary | ICD-10-CM | POA: Diagnosis not present

## 2020-11-20 DIAGNOSIS — R1310 Dysphagia, unspecified: Secondary | ICD-10-CM | POA: Diagnosis not present

## 2020-11-20 DIAGNOSIS — K3184 Gastroparesis: Secondary | ICD-10-CM | POA: Diagnosis not present

## 2020-11-20 DIAGNOSIS — K9189 Other postprocedural complications and disorders of digestive system: Secondary | ICD-10-CM | POA: Diagnosis not present

## 2020-12-29 ENCOUNTER — Other Ambulatory Visit: Payer: Self-pay | Admitting: Family Medicine

## 2020-12-29 DIAGNOSIS — I10 Essential (primary) hypertension: Secondary | ICD-10-CM

## 2020-12-29 NOTE — Telephone Encounter (Signed)
Courtesy refill. Future visit in 2 months

## 2021-01-13 ENCOUNTER — Other Ambulatory Visit: Payer: Self-pay

## 2021-01-13 ENCOUNTER — Ambulatory Visit (INDEPENDENT_AMBULATORY_CARE_PROVIDER_SITE_OTHER): Payer: Medicare Other | Admitting: Family Medicine

## 2021-01-13 ENCOUNTER — Encounter: Payer: Self-pay | Admitting: Family Medicine

## 2021-01-13 VITALS — BP 124/72 | HR 59 | Temp 98.3°F | Wt 122.0 lb

## 2021-01-13 DIAGNOSIS — E559 Vitamin D deficiency, unspecified: Secondary | ICD-10-CM

## 2021-01-13 DIAGNOSIS — E7800 Pure hypercholesterolemia, unspecified: Secondary | ICD-10-CM | POA: Diagnosis not present

## 2021-01-13 DIAGNOSIS — I10 Essential (primary) hypertension: Secondary | ICD-10-CM

## 2021-01-13 DIAGNOSIS — Z23 Encounter for immunization: Secondary | ICD-10-CM | POA: Diagnosis not present

## 2021-01-13 NOTE — Progress Notes (Signed)
    Established patient visit   Patient: Sara Huynh   DOB: 02/01/1951   70 y.o. Female  MRN: [id number] Visit Date: 01/13/2021  Today's healthcare provider: Donald Fisher, MD   Chief Complaint  Patient presents with   Hypertension   Subjective    HPI  Hypertension, follow-up  BP Readings from Last 3 Encounters:  01/13/21 140/70  06/17/20 (!) 175/91  01/23/20 122/68   Wt Readings from Last 3 Encounters:  01/13/21 122 lb (55.3 kg)  06/17/20 123 lb (55.8 kg)  01/23/20 123 lb 9.6 oz (56.1 kg)     She was last seen for hypertension 7 months ago.  BP at that visit was 175/91. Management since that visit includes adding amlodipine 2.5mg.  She reports excellent compliance with treatment. She is not having side effects.  She is following a Low Sodium diet. She is exercising. She does not smoke.  Use of agents associated with hypertension: none.   Outside blood pressures are normal at home. Symptoms: No chest pain No chest pressure  No palpitations No syncope  No dyspnea No orthopnea  No paroxysmal nocturnal dyspnea No lower extremity edema   Pertinent labs: Lab Results  Component Value Date   CHOL 221 (H) 01/23/2020   HDL 49 01/23/2020   LDLCALC 105 (H) 01/23/2020   TRIG 398 (H) 01/23/2020   CHOLHDL 4.5 (H) 01/23/2020   Lab Results  Component Value Date   NA 143 06/17/2020   K 4.8 06/17/2020   CREATININE 1.04 (H) 06/17/2020   EGFR 58 (L) 06/17/2020   GFRNONAA 52 (L) 01/23/2020   GLUCOSE 108 (H) 06/17/2020     The 10-year ASCVD risk score (Arnett DK, et al., 2019) is: 15.7%   ---------------------------------------------------------------------------------------------------  Medications: Outpatient Medications Prior to Visit  Medication Sig   acetaminophen (TYLENOL) 500 MG tablet Take 1,000 mg by mouth every 8 (eight) hours as needed.    amLODipine (NORVASC) 2.5 MG tablet TAKE 1 TABLET BY MOUTH EVERY EVENING.   Cholecalciferol (VITAMIN D3) 125  MCG (5000 UT) CAPS Take by mouth.   escitalopram (LEXAPRO) 10 MG tablet Take 1 tablet (10 mg total) by mouth daily.   lisinopril (ZESTRIL) 10 MG tablet TAKE 1 TABLET BY MOUTH EVERY DAY **REPLACES LISINOPRIL/HCTZ**   omeprazole-sodium bicarbonate (ZEGERID) 40-1100 MG capsule Take 1 capsule by mouth 2 (two) times daily.    ondansetron (ZOFRAN) 8 MG tablet Take 1 tablet by mouth as needed.   polyethylene glycol (MIRALAX / GLYCOLAX) packet Take 17 g by mouth daily as needed.   simvastatin (ZOCOR) 20 MG tablet TAKE 1 TABLET BY MOUTH EVERYDAY AT BEDTIME   sucralfate (CARAFATE) 1 g tablet Take 1 g by mouth 4 (four) times daily.    traZODone (DESYREL) 50 MG tablet Take 1-2 tablets (50-100 mg total) by mouth at bedtime as needed for sleep.   No facility-administered medications prior to visit.    Review of Systems  Constitutional: Negative.   HENT:  Positive for congestion and sinus pressure.   Respiratory: Negative.    Cardiovascular: Negative.   Gastrointestinal: Negative.   Allergic/Immunologic: Positive for environmental allergies.  Neurological:  Positive for headaches. Negative for dizziness, syncope, speech difficulty, light-headedness and numbness.      Objective    BP 140/70 (BP Location: Right Arm, Patient Position: Sitting, Cuff Size: Normal)   Pulse (!) 59   Temp 98.3 F (36.8 C) (Oral)   Wt 122 lb (55.3 kg)   SpO2 100%   BMI   23.83 kg/m    Physical Exam   General: Appearance:    Well developed, well nourished female in no acute distress  Eyes:    PERRL, conjunctiva/corneas clear, EOM's intact       Lungs:     Clear to auscultation bilaterally, respirations unlabored  Heart:    Bradycardic. Normal rhythm. No murmurs, rubs, or gallops.    MS:   All extremities are intact.    Neurologic:   Awake, alert, oriented x 3. No apparent focal neurological defect.          Assessment & Plan     1. Essential hypertension Well controlled.  Continue current medications.   -  CBC  2. Avitaminosis D Taking vitamin d supplements consistently.  - VITAMIN D 25 Hydroxy (Vit-D Deficiency, Fractures)  3. Hypercholesteremia She is tolerating simvastatin well with no adverse effects.   - Comprehensive metabolic panel - Lipid panel  4. Need for immunization against influenza  - Flu Vaccine QUAD High Dose(Fluad)    She declined BMD.          The entirety of the information documented in the History of Present Illness, Review of Systems and Physical Exam were personally obtained by me. Portions of this information were initially documented by the CMA and reviewed by me for thoroughness and accuracy.     Donald Fisher, MD  [facility] [phone] (phone) [phone] (fax)  [facility]  

## 2021-01-14 LAB — COMPREHENSIVE METABOLIC PANEL
ALT: 15 IU/L (ref 0–32)
AST: 19 IU/L (ref 0–40)
Albumin/Globulin Ratio: 1.7 (ref 1.2–2.2)
Albumin: 4.5 g/dL (ref 3.8–4.8)
Alkaline Phosphatase: 60 IU/L (ref 44–121)
BUN/Creatinine Ratio: 10 — ABNORMAL LOW (ref 12–28)
BUN: 10 mg/dL (ref 8–27)
Bilirubin Total: 0.8 mg/dL (ref 0.0–1.2)
CO2: 26 mmol/L (ref 20–29)
Calcium: 10.1 mg/dL (ref 8.7–10.3)
Chloride: 98 mmol/L (ref 96–106)
Creatinine, Ser: 1.01 mg/dL — ABNORMAL HIGH (ref 0.57–1.00)
Globulin, Total: 2.6 g/dL (ref 1.5–4.5)
Glucose: 93 mg/dL (ref 70–99)
Potassium: 4.7 mmol/L (ref 3.5–5.2)
Sodium: 140 mmol/L (ref 134–144)
Total Protein: 7.1 g/dL (ref 6.0–8.5)
eGFR: 60 mL/min/{1.73_m2} (ref >59)

## 2021-01-14 LAB — LIPID PANEL
Chol/HDL Ratio: 3.8 ratio (ref 0.0–4.4)
Cholesterol, Total: 204 mg/dL — ABNORMAL HIGH (ref 100–199)
HDL: 54 mg/dL (ref >39)
LDL Chol Calc (NIH): 106 mg/dL — ABNORMAL HIGH (ref 0–99)
Triglycerides: 257 mg/dL — ABNORMAL HIGH (ref 0–149)
VLDL Cholesterol Cal: 44 mg/dL — ABNORMAL HIGH (ref 5–40)

## 2021-01-14 LAB — CBC
Hematocrit: 39.4 % (ref 34.0–46.6)
Hemoglobin: 12.6 g/dL (ref 11.1–15.9)
MCH: 27.7 pg (ref 26.6–33.0)
MCHC: 32 g/dL (ref 31.5–35.7)
MCV: 87 fL (ref 79–97)
Platelets: 215 10*3/uL (ref 150–450)
RBC: 4.55 x10E6/uL (ref 3.77–5.28)
RDW: 14.4 % (ref 11.7–15.4)
WBC: 8.4 10*3/uL (ref 3.4–10.8)

## 2021-01-14 LAB — VITAMIN D 25 HYDROXY (VIT D DEFICIENCY, FRACTURES): Vit D, 25-Hydroxy: 37.6 ng/mL (ref 30.0–100.0)

## 2021-01-22 ENCOUNTER — Ambulatory Visit
Admission: EM | Admit: 2021-01-22 | Discharge: 2021-01-22 | Disposition: A | Discharge status: Home / Self Care | Payer: Medicare Other | Attending: Emergency Medicine | Admitting: Emergency Medicine

## 2021-01-22 ENCOUNTER — Other Ambulatory Visit: Payer: Self-pay

## 2021-01-22 DIAGNOSIS — B349 Viral infection, unspecified: Secondary | ICD-10-CM

## 2021-01-22 NOTE — ED Triage Notes (Signed)
Patient presents to Urgent Care with complaints of body aches, cough, headache, sore throat, and nasal congestion x 5 days.Treating symptoms with benadryl and tylenol.   Denies fever.

## 2021-01-22 NOTE — ED Provider Notes (Signed)
Roderic Palau    CSN: [id number] Arrival date & time: 01/22/21  2725      History   Chief Complaint Chief Complaint  Patient presents with   Generalized Body Aches   Cough   Headache   Nasal Congestion    X 3 days    HPI Sara Huynh is a 70 y.o. female.  Patient presents with 4 to 5-day history of headache, body aches, sore throat, nasal congestion, cough.  She reports low-grade fever of 99.  She denies rash, shortness of breath, vomiting, diarrhea, or other symptoms.  Treatment at home with Tylenol.  Her medical history includes hypertension.  The history is provided by the patient and medical records.   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   History of small bowel obstruction 04/15/2013   Systemic sclerosis ([place]) 08/08/2012   Avitaminosis D 10/30/2009   Pain in shoulder 04/18/2009   Hypercholesteremia 08/23/2008   Abnormal ECG 08/23/2008   H/O total hysterectomy 08/21/2008   Can't get food down 08/16/2008   Essential hypertension 08/16/2008   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      Home Medications    Prior to Admission medications   Medication Sig Start Date End Date Taking? Authorizing Provider   acetaminophen (TYLENOL) 500 MG tablet Take 1,000 mg by mouth every 8 (eight) hours as needed.     [provider]  amLODipine (NORVASC) 2.5 MG tablet TAKE 1 TABLET BY MOUTH EVERY EVENING. 11/11/20   Birdie Sons, MD  Cholecalciferol (VITAMIN D3) 125 MCG (5000 UT) CAPS Take by mouth.    [provider]  escitalopram (LEXAPRO) 10 MG tablet Take 1 tablet (10 mg total) by mouth daily. 06/17/20   Birdie Sons, MD  lisinopril (ZESTRIL) 10 MG tablet TAKE 1 TABLET BY MOUTH EVERY DAY **REPLACES LISINOPRIL/HCTZ** 12/29/20   Birdie Sons, MD  omeprazole-sodium bicarbonate (ZEGERID) 40-1100 MG capsule Take 1 capsule by mouth 2 (two) times daily.     [provider]  ondansetron (ZOFRAN) 8 MG tablet Take 1 tablet by mouth as needed.    [provider]  polyethylene glycol (MIRALAX / GLYCOLAX) packet Take 17 g by mouth daily as needed.    [provider]  simvastatin (ZOCOR) 20 MG tablet TAKE 1 TABLET BY MOUTH EVERYDAY AT BEDTIME 01/23/20   Birdie Sons, MD  sucralfate (CARAFATE) 1 g tablet Take 1 g by mouth 4 (four) times daily.     [provider]  traZODone (DESYREL) 50 MG tablet Take 1-2 tablets (50-100 mg total) by mouth at bedtime as needed for sleep. 06/17/20   Lelon Huh  E, MD    Family History Family History  Problem Relation Age of Onset   CAD Mother    Heart disease Mother        MI   Dementia Mother    Alzheimer's disease Mother    CVA Father    COPD Father    Emphysema Father    Breast cancer Neg Hx     Social History Social History   Tobacco Use   Smoking status: Never   Smokeless tobacco: Never  Vaping Use   Vaping Use: Never used  Substance Use Topics   Alcohol use: No   Drug use: No     Allergies   Alendronate, Dicyclomine, and Prednisone   Review of Systems Review of Systems  Constitutional:  Positive for fever. Negative for chills.  HENT:  Positive for congestion and sore throat. Negative for ear  pain.   Respiratory:  Positive for cough. Negative for shortness of breath.   Cardiovascular:  Negative for chest pain and palpitations.  Gastrointestinal:  Negative for abdominal pain, diarrhea and vomiting.  Skin:  Negative for color change and rash.  Neurological:  Positive for headaches. Negative for dizziness and weakness.  All other systems reviewed and are negative.   Physical Exam Triage Vital Signs ED Triage Vitals  Enc Vitals Group     BP      Pulse      Resp      Temp      Temp src      SpO2      Weight      Height      Head Circumference      Peak Flow      Pain Score      Pain Loc      Pain Edu?      Excl. in [place]?    No data found.  Updated Vital Signs BP 103/70 (BP Location: Left Arm)   Pulse 84   Temp 99.6 F (37.6 C) (Oral)   Resp 18   SpO2 96%   Visual Acuity Right Eye Distance:   Left Eye Distance:   Bilateral Distance:    Right Eye Near:   Left Eye Near:    Bilateral Near:     Physical Exam Vitals and nursing note reviewed.  Constitutional:      General: She is not in acute distress.    Appearance: Normal appearance. She is well-developed. She is not ill-appearing.  HENT:     Head: Normocephalic and atraumatic.     Right Ear: Tympanic membrane normal.     Left Ear: Tympanic membrane normal.     Nose: Nose normal.     Mouth/Throat:     Mouth: Mucous membranes are moist.     Pharynx: Oropharynx is clear.  Eyes:     Conjunctiva/sclera: Conjunctivae normal.  Cardiovascular:     Rate and Rhythm: Normal rate and regular rhythm.     Heart sounds: Normal heart sounds.  Pulmonary:     Effort: Pulmonary effort is normal. No respiratory distress.     Breath sounds: Normal breath sounds.  Abdominal:     Palpations: Abdomen is soft.     Tenderness: There is no abdominal tenderness.  Musculoskeletal:     Cervical back: Neck supple.  Skin:    General: Skin is warm and dry.  Neurological:     General: No focal deficit present.     Mental  Status: She is alert and oriented to  person, place, and time.     Gait: Gait normal.  Psychiatric:        Mood and Affect: Mood normal.        Behavior: Behavior normal.     UC Treatments / Results  Labs (all labs ordered are listed, but only abnormal results are displayed) Labs Reviewed  COVID-19, FLU A+B NAA    EKG   Radiology No results found.  Procedures Procedures (including critical care time)  Medications Ordered in UC Medications - No data to display  Initial Impression / Assessment and Plan / UC Course  I have reviewed the triage vital signs and the nursing notes.  Pertinent labs & imaging results that were available during my care of the patient were reviewed by me and considered in my medical decision making (see chart for details).    Viral illness.  COVID and Flu pending.  Instructed patient to self quarantine per CDC guidelines.  Discussed symptomatic treatment including Tylenol, rest, hydration.  Instructed patient to follow up with PCP if symptoms are not improving.  Patient agrees to plan of care.   Final Clinical Impressions(s) / UC Diagnoses   Final diagnoses:  Viral illness     Discharge Instructions      Your COVID and Flu tests are pending.  You should self quarantine until the test results are back.    Take Tylenol as needed for fever or discomfort.  Rest and keep yourself hydrated.    Follow-up with your primary care provider if your symptoms are not improving.         ED Prescriptions   None    PDMP not reviewed this encounter.   Sharion Balloon, NP 01/22/21 [phone]

## 2021-01-22 NOTE — Discharge Instructions (Addendum)
Your COVID and Flu tests are pending.  You should self quarantine until the test results are back.    Take Tylenol as needed for fever or discomfort.  Rest and keep yourself hydrated.    Follow-up with your primary care provider if your symptoms are not improving.

## 2021-01-31 ENCOUNTER — Other Ambulatory Visit: Payer: Self-pay | Admitting: Family Medicine

## 2021-01-31 DIAGNOSIS — I10 Essential (primary) hypertension: Secondary | ICD-10-CM

## 2021-02-21 ENCOUNTER — Other Ambulatory Visit: Payer: Self-pay | Admitting: Family Medicine

## 2021-02-21 DIAGNOSIS — E7800 Pure hypercholesterolemia, unspecified: Secondary | ICD-10-CM

## 2021-02-21 NOTE — Telephone Encounter (Signed)
Requested Prescriptions  Pending Prescriptions Disp Refills  . simvastatin (ZOCOR) 20 MG tablet [Pharmacy Med Name: SIMVASTATIN 20 MG TABLET] 90 tablet 3    Sig: TAKE 1 TABLET BY MOUTH EVERYDAY AT BEDTIME     Cardiovascular:  Antilipid - Statins Failed - 02/21/2021 12:23 PM      Failed - Total Cholesterol in normal range and within 360 days    Cholesterol, Total  Date Value Ref Range Status  01/13/2021 204 (H) 100 - 199 mg/dL Final         Failed - LDL in normal range and within 360 days    LDL Chol Calc (NIH)  Date Value Ref Range Status  01/13/2021 106 (H) 0 - 99 mg/dL Final         Failed - Triglycerides in normal range and within 360 days    Triglycerides  Date Value Ref Range Status  01/13/2021 257 (H) 0 - 149 mg/dL Final         Passed - HDL in normal range and within 360 days    HDL  Date Value Ref Range Status  01/13/2021 54 >39 mg/dL Final         Passed - Patient is not pregnant      Passed - Valid encounter within last 12 months    Recent Outpatient Visits          1 month ago Essential hypertension   [facility] Birdie Sons, MD   8 months ago Hilliard, Donald E, MD   10 months ago Sinus congestion   [facility], Vickki Muff, PA-C   1 year ago Need for influenza vaccination   [facility] Birdie Sons, MD   1 year ago Seborrheic dermatitis, unspecified   [facility] Caryn Section, Kirstie Peri, MD

## 2021-03-25 ENCOUNTER — Other Ambulatory Visit: Payer: Self-pay | Admitting: Family Medicine

## 2021-03-25 DIAGNOSIS — E7800 Pure hypercholesterolemia, unspecified: Secondary | ICD-10-CM

## 2021-03-25 DIAGNOSIS — F439 Reaction to severe stress, unspecified: Secondary | ICD-10-CM

## 2021-03-25 DIAGNOSIS — I10 Essential (primary) hypertension: Secondary | ICD-10-CM

## 2021-03-25 MED ORDER — ESCITALOPRAM OXALATE 10 MG PO TABS: 10.0000 mg | ORAL_TABLET | Freq: Every day | ORAL | 1 refills | Status: DC

## 2021-03-25 MED ORDER — AMLODIPINE BESYLATE 2.5 MG PO TABS: 2.5000 mg | ORAL_TABLET | Freq: Every evening | ORAL | 1 refills | Status: DC

## 2021-03-25 MED ORDER — LISINOPRIL 10 MG PO TABS: 10.0000 mg | ORAL_TABLET | Freq: Every day | ORAL | 3 refills | Status: DC

## 2021-03-25 MED ORDER — SIMVASTATIN 20 MG PO TABS: 20.0000 mg | ORAL_TABLET | Freq: Every day | ORAL | 1 refills | Status: DC

## 2021-03-25 NOTE — Telephone Encounter (Signed)
LOV: 01/13/2021  NOV: None  Last Refill:

## 2021-03-25 NOTE — Telephone Encounter (Signed)
[facility] faxed refill request for the following medications:  lisinopril (ZESTRIL) 10 MG tablet  ondansetron (ZOFRAN) 8 MG tablet   escitalopram (LEXAPRO) 10 MG tablet   amLODipine (NORVASC) 2.5 MG tablet   simvastatin (ZOCOR) 20 MG tablet   Please advise.

## 2021-05-14 DIAGNOSIS — Z9889 Other specified postprocedural states: Secondary | ICD-10-CM | POA: Diagnosis not present

## 2021-05-14 DIAGNOSIS — R110 Nausea: Secondary | ICD-10-CM | POA: Diagnosis not present

## 2021-06-10 ENCOUNTER — Telehealth: Payer: Self-pay

## 2021-06-10 ENCOUNTER — Ambulatory Visit (INDEPENDENT_AMBULATORY_CARE_PROVIDER_SITE_OTHER): Payer: Medicare HMO

## 2021-06-10 VITALS — Ht 60.0 in | Wt 119.0 lb

## 2021-06-10 DIAGNOSIS — Z0000 Encounter for general adult medical examination without abnormal findings: Secondary | ICD-10-CM | POA: Diagnosis not present

## 2021-06-10 NOTE — Telephone Encounter (Signed)
Copied from [place] [id number]. Topic: Appointment Scheduling - Scheduling Inquiry for Clinic >> Jun 10, 2021  8:28 AM Sara Huynh wrote: Reason for CRM: Pt called in wanting to get her [facility] appt for today rescheduled and requested a call back. Please advise.

## 2021-06-10 NOTE — Progress Notes (Signed)
Virtual Visit via Telephone Note  I connected with  Sara Huynh on 06/10/21 at 11:00 AM EST by telephone and verified that I am speaking with the correct person using two identifiers.  Location: Patient: home Provider: [facility] Persons participating in the virtual visit: [place]   I discussed the limitations, risks, security and privacy concerns of performing an evaluation and management service by telephone and the availability of in person appointments. The patient expressed understanding and agreed to proceed.  Interactive audio and video telecommunications were attempted between this nurse and patient, however failed, due to patient having technical difficulties OR patient did not have access to video capability.  We continued and completed visit with audio only.  Some vital signs may be absent or patient reported.   Dionisio David, LPN  Subjective:   Sara Huynh is a 71 y.o. female who presents for Medicare Annual (Subsequent) preventive examination.  Review of Systems           Objective:    Today's Vitals   06/10/21 1057  Weight: 119 lb (54 kg)  Height: 5' (1.524 m)   Body mass index is 23.24 kg/m.  Advanced Directives 10/30/2019 10/18/2018 09/16/2017 04/13/2017 08/25/2016 08/21/2015  Does Patient Have a Medical Advance Directive? Yes Yes Yes No Yes Yes  Type of Paramedic of [place];Living will [place];Living will Healthcare Power of [place]  Does patient want to make changes to medical advance directive? - - - - Yes (ED - Information included in AVS) -  Copy of [place] in Chart? No - copy requested No - copy requested No - copy requested - No - copy requested -  Would patient like information on creating a medical advance directive? - - - No - Patient declined - -    Current Medications (verified) Outpatient Encounter  Medications as of 06/10/2021  Medication Sig   amLODipine (NORVASC) 2.5 MG tablet Take by mouth.   HYDROcodone-acetaminophen (NORCO/VICODIN) 5-325 MG tablet Take 1 tablet by mouth as needed.   acetaminophen (TYLENOL) 500 MG tablet Take 1,000 mg by mouth every 8 (eight) hours as needed.    acetaminophen (TYLENOL) 500 MG tablet Take by mouth.   amLODipine (NORVASC) 2.5 MG tablet Take 1 tablet (2.5 mg total) by mouth every evening.   Cholecalciferol (VITAMIN D3) 125 MCG (5000 UT) CAPS Take by mouth.   escitalopram (LEXAPRO) 10 MG tablet Take 1 tablet (10 mg total) by mouth daily.   lisinopril (ZESTRIL) 10 MG tablet Take 1 tablet (10 mg total) by mouth daily.   omeprazole-sodium bicarbonate (ZEGERID) 40-1100 MG capsule Take 1 capsule by mouth 2 (two) times daily.    ondansetron (ZOFRAN) 8 MG tablet Take 1 tablet by mouth as needed.   polyethylene glycol (MIRALAX / GLYCOLAX) packet Take 17 g by mouth daily as needed.   simvastatin (ZOCOR) 20 MG tablet Take 1 tablet (20 mg total) by mouth at bedtime.   sucralfate (CARAFATE) 1 g tablet Take 1 g by mouth 4 (four) times daily.    traZODone (DESYREL) 50 MG tablet Take 1-2 tablets (50-100 mg total) by mouth at bedtime as needed for sleep.   No facility-administered encounter medications on file as of 06/10/2021.    Allergies (verified) Alendronate, Dicyclomine, and Prednisone   History: Past Medical History:  Diagnosis Date   Anxiety    Hyperlipidemia    Hypertension  Shingles 12/18/2014   Past Surgical History:  Procedure Laterality Date   ABDOMINAL HYSTERECTOMY  1986   APPENDECTOMY     CHOLECYSTECTOMY  03/2009   ESOPHAGEAL DILATION     G tube placed for 18 mths   2007   gi pacemaker  2002   NASAL SINUS SURGERY  2005   OOPHORECTOMY     PYLOROPLASTY  07/15/15   Family History  Problem Relation Age of Onset   CAD Mother    Heart disease Mother        MI   Dementia Mother    Alzheimer's disease Mother    CVA Father    COPD Father     Emphysema Father    Breast cancer Neg Hx    Social History   Socioeconomic History   Marital status: Married    Spouse name: Not on file   Number of children: 3   Years of education: Not on file   Highest education level: Some college, no degree  Occupational History   Occupation: retired  Tobacco Use   Smoking status: Never   Smokeless tobacco: Never  Vaping Use   Vaping Use: Never used  Substance and Sexual Activity   Alcohol use: No   Drug use: No   Sexual activity: Not on file  Other Topics Concern   Not on file  Social History Narrative   Not on file   Social Determinants of Health   Financial Resource Strain: Not on file  Food Insecurity: Not on file  Transportation Needs: Not on file  Physical Activity: Not on file  Stress: Not on file  Social Connections: Not on file    Tobacco Counseling Counseling given: Not Answered   Clinical Intake:  Pre-visit preparation completed: Yes  Pain : No/denies pain     Nutritional Risks: None  How often do you need to have someone help you when you read instructions, pamphlets, or other written materials from your doctor or pharmacy?: 1 - Never  Diabetic?no     Information entered by :: Kirke Shaggy, LPN   Activities of Daily Living In your present state of health, do you have any difficulty performing the following activities: 06/17/2020  Hearing? N  Vision? N  Difficulty concentrating or making decisions? N  Walking or climbing stairs? N  Dressing or bathing? N  Doing errands, shopping? N  Some recent data might be hidden    Patient Care Team: Birdie Sons, MD as PCP - General (Family Medicine) Leandrew Koyanagi, MD as Referring Physician (Ophthalmology) Scherrie November, MD as Referring Physician (Gastroenterology)  Indicate any recent Medical Services you may have received from other than [facility] providers in the past year (date may be approximate).     Assessment:   This is a routine wellness  examination for [place].  Hearing/Vision screen No results found.  Dietary issues and exercise activities discussed:     Goals Addressed   None    Depression Screen PHQ 2/9 Scores 01/21/2021 06/17/2020 10/30/2019 10/18/2018 09/16/2017 08/25/2016 08/25/2016  PHQ - 2 Score 0 0 0 0 0 0 0  PHQ- 9 Score - 2 - - - 0 -    Fall Risk Fall Risk  01/21/2021 06/17/2020 10/30/2019 01/01/2019 10/18/2018  Falls in the past year? 0 0 0 0 0  Number falls in past yr: 0 0 0 0 -  Injury with Fall? 0 0 0 0 -  Follow up - Falls evaluation completed - - -  FALL RISK PREVENTION PERTAINING TO THE HOME:  Any stairs in or around the home? Yes  If so, are there any without handrails? No  Home free of loose throw rugs in walkways, pet beds, electrical cords, etc? Yes  Adequate lighting in your home to reduce risk of falls? Yes   ASSISTIVE DEVICES UTILIZED TO PREVENT FALLS:  Life alert? No  Use of a cane, walker or w/c? No  Grab bars in the bathroom? No  Shower chair or bench in shower? No  Elevated toilet seat or a handicapped toilet? No    Cognitive Function:Normal cognitive status assessed by direct observation by this Nurse Health Advisor. No abnormalities found.       6CIT Screen 10/18/2018 08/25/2016  What Year? 0 points 0 points  What month? 0 points 0 points  What time? 0 points 0 points  Count back from 20 0 points 0 points  Months in reverse 0 points 0 points  Repeat phrase 0 points 4 points  Total Score 0 4    Immunizations Immunization History  Administered Date(s) Administered   Fluad Quad(high Dose 65+) 01/01/2019, 01/23/2020, 01/13/2021   Influenza, High Dose Seasonal PF 01/03/2016, 01/08/2017, 01/28/2018   Influenza,inj,Quad PF,6+ Mos 01/11/2015   PFIZER Comirnaty(Gray Top)Covid-19 Tri-Sucrose Vaccine 05/23/2020   PFIZER(Purple Top)SARS-COV-2 Vaccination 05/25/2019, 06/19/2019   Pneumococcal Conjugate-13 01/03/2016   Pneumococcal Polysaccharide-23 01/08/2017   Tdap 10/05/2010    Zoster Recombinat (Shingrix) 05/19/2021    TDAP status: Due, Education has been provided regarding the importance of this vaccine. Advised may receive this vaccine at local pharmacy or Health Dept. Aware to provide a copy of the vaccination record if obtained from local pharmacy or Health Dept. Verbalized acceptance and understanding.  Flu Vaccine status: Up to date  Pneumococcal vaccine status: Up to date  Covid-19 vaccine status: Completed vaccines  Qualifies for Shingles Vaccine? Yes   Zostavax completed No   Shingrix Completed?: No.    Education has been provided regarding the importance of this vaccine. Patient has been advised to call insurance company to determine out of pocket expense if they have not yet received this vaccine. Advised may also receive vaccine at local pharmacy or Health Dept. Verbalized acceptance and understanding.  Screening Tests Health Maintenance  Topic Date Due   DEXA SCAN  11/03/2019   COVID-19 Vaccine (4 - Booster for Pfizer series) 07/18/2020   TETANUS/TDAP  10/04/2020   MAMMOGRAM  02/10/2021   Zoster Vaccines- Shingrix (2 of 2) 07/14/2021   COLONOSCOPY (Pts 45-62yrs Insurance coverage will need to be confirmed)  07/11/2024   Pneumonia Vaccine 18+ Years old  Completed   INFLUENZA VACCINE  Completed   Hepatitis C Screening  Completed   HPV VACCINES  Aged Out    Health Maintenance  Health Maintenance Due  Topic Date Due   DEXA SCAN  11/03/2019   COVID-19 Vaccine (4 - Booster for [place] series) 07/18/2020   TETANUS/TDAP  10/04/2020   MAMMOGRAM  02/10/2021    Colorectal cancer screening: Type of screening: Colonoscopy. Completed 07/12/19. Repeat every 5 years  Mammogram status: Completed 02/11/20. Repeat every year- declined referral due to husband's ill health currently   Lung Cancer Screening: (Low Dose CT Chest recommended if Age 25-80 years, 30 pack-year currently smoking OR have quit w/in 15years.) does not qualify.    Additional  Screening:  Hepatitis C Screening: does qualify; Completed 02/11/12  Vision Screening: Recommended annual ophthalmology exams for early detection of glaucoma and other disorders of the eye. Is  the patient up to date with their annual eye exam?  Yes  Who is the provider or what is the name of the office in which the patient attends annual eye exams? [facility] If pt is not established with a provider, would they like to be referred to a provider to establish care? No .   Dental Screening: Recommended annual dental exams for proper oral hygiene  Community Resource Referral / Chronic Care Management: CRR required this visit?  No   CCM required this visit?  No      Plan:     I have personally reviewed and noted the following in the patients chart:   Medical and social history Use of alcohol, tobacco or illicit drugs  Current medications and supplements including opioid prescriptions.  Functional ability and status Nutritional status Physical activity Advanced directives List of other physicians Hospitalizations, surgeries, and ER visits in previous 12 months Vitals Screenings to include cognitive, depression, and falls Referrals and appointments  In addition, I have reviewed and discussed with patient certain preventive protocols, quality metrics, and best practice recommendations. A written personalized care plan for preventive services as well as general preventive health recommendations were provided to patient.     Dionisio David, LPN   08/15/3873   Nurse Notes: husband currently w/ stage 4 lung cancer

## 2021-06-10 NOTE — Patient Instructions (Signed)
[name] , Thank you for taking time to come for your Medicare Wellness Visit. I appreciate your ongoing commitment to your health goals. Please review the following plan we discussed and let me know if I can assist you in the future.   Screening recommendations/referrals: Colonoscopy: 07/12/19 Mammogram: 02/11/20, declined referral Bone Density: 11/02/17, declined referral Recommended yearly ophthalmology/optometry visit for glaucoma screening and checkup Recommended yearly dental visit for hygiene and checkup  Vaccinations: Influenza vaccine: 01/13/21 Pneumococcal vaccine: 01/08/17 Tdap vaccine: 10/05/10 Shingles vaccine: Shingrix 05/19/21, will get second shot soon   Covid-19:05/25/19, 06/19/19  Advanced directives: no  Conditions/risks identified: none  Next appointment: Follow up in one year for your annual wellness visit - declined d/t husband's ill health   Preventive Care 28 Years and Older, Female Preventive care refers to lifestyle choices and visits with your health care provider that can promote health and wellness. What does preventive care include? A yearly physical exam. This is also called an annual well check. Dental exams once or twice a year. Routine eye exams. Ask your health care provider how often you should have your eyes checked. Personal lifestyle choices, including: Daily care of your teeth and gums. Regular physical activity. Eating a healthy diet. Avoiding tobacco and drug use. Limiting alcohol use. Practicing safe sex. Taking low-dose aspirin every day. Taking vitamin and mineral supplements as recommended by your health care provider. What happens during an annual well check? The services and screenings done by your health care provider during your annual well check will depend on your age, overall health, lifestyle risk factors, and family history of disease. Counseling  Your health care provider may ask you questions about your: Alcohol use. Tobacco  use. Drug use. Emotional well-being. Home and relationship well-being. Sexual activity. Eating habits. History of falls. Memory and ability to understand (cognition). Work and work Statistician. Reproductive health. Screening  You may have the following tests or measurements: Height, weight, and BMI. Blood pressure. Lipid and cholesterol levels. These may be checked every 5 years, or more frequently if you are over 72 years old. Skin check. Lung cancer screening. You may have this screening every year starting at age 73 if you have a 30-pack-year history of smoking and currently smoke or have quit within the past 15 years. Fecal occult blood test (FOBT) of the stool. You may have this test every year starting at age 46. Flexible sigmoidoscopy or colonoscopy. You may have a sigmoidoscopy every 5 years or a colonoscopy every 10 years starting at age 33. Hepatitis C blood test. Hepatitis B blood test. Sexually transmitted disease (STD) testing. Diabetes screening. This is done by checking your blood sugar (glucose) after you have not eaten for a while (fasting). You may have this done every 1-3 years. Bone density scan. This is done to screen for osteoporosis. You may have this done starting at age 76. Mammogram. This may be done every 1-2 years. Talk to your health care provider about how often you should have regular mammograms. Talk with your health care provider about your test results, treatment options, and if necessary, the need for more tests. Vaccines  Your health care provider may recommend certain vaccines, such as: Influenza vaccine. This is recommended every year. Tetanus, diphtheria, and acellular pertussis (Tdap, Td) vaccine. You may need a Td booster every 10 years. Zoster vaccine. You may need this after age 58. Pneumococcal 13-valent conjugate (PCV13) vaccine. One dose is recommended after age 36. Pneumococcal polysaccharide (PPSV23) vaccine. One dose is  recommended  after age 46. Talk to your health care provider about which screenings and vaccines you need and how often you need them. This information is not intended to replace advice given to you by your health care provider. Make sure you discuss any questions you have with your health care provider. Document Released: 04/25/2015 Document Revised: 12/17/2015 Document Reviewed: 01/28/2015 Elsevier Interactive Patient Education  2017 [place] Prevention in the Home Falls can cause injuries. They can happen to people of all ages. There are many things you can do to make your home safe and to help prevent falls. What can I do on the outside of my home? Regularly fix the edges of walkways and driveways and fix any cracks. Remove anything that might make you trip as you walk through a door, such as a raised step or threshold. Trim any bushes or trees on the path to your home. Use bright outdoor lighting. Clear any walking paths of anything that might make someone trip, such as rocks or tools. Regularly check to see if handrails are loose or broken. Make sure that both sides of any steps have handrails. Any raised decks and porches should have guardrails on the edges. Have any leaves, snow, or ice cleared regularly. Use sand or salt on walking paths during winter. Clean up any spills in your garage right away. This includes oil or grease spills. What can I do in the bathroom? Use night lights. Install grab bars by the toilet and in the tub and shower. Do not use towel bars as grab bars. Use non-skid mats or decals in the tub or shower. If you need to sit down in the shower, use a plastic, non-slip stool. Keep the floor dry. Clean up any water that spills on the floor as soon as it happens. Remove soap buildup in the tub or shower regularly. Attach bath mats securely with double-sided non-slip rug tape. Do not have throw rugs and other things on the floor that can make you trip. What can I do  in the bedroom? Use night lights. Make sure that you have a light by your bed that is easy to reach. Do not use any sheets or blankets that are too big for your bed. They should not hang down onto the floor. Have a firm chair that has side arms. You can use this for support while you get dressed. Do not have throw rugs and other things on the floor that can make you trip. What can I do in the kitchen? Clean up any spills right away. Avoid walking on wet floors. Keep items that you use a lot in easy-to-reach places. If you need to reach something above you, use a strong step stool that has a grab bar. Keep electrical cords out of the way. Do not use floor polish or wax that makes floors slippery. If you must use wax, use non-skid floor wax. Do not have throw rugs and other things on the floor that can make you trip. What can I do with my stairs? Do not leave any items on the stairs. Make sure that there are handrails on both sides of the stairs and use them. Fix handrails that are broken or loose. Make sure that handrails are as long as the stairways. Check any carpeting to make sure that it is firmly attached to the stairs. Fix any carpet that is loose or worn. Avoid having throw rugs at the top or bottom of the stairs. If  you do have throw rugs, attach them to the floor with carpet tape. Make sure that you have a light switch at the top of the stairs and the bottom of the stairs. If you do not have them, ask someone to add them for you. What else can I do to help prevent falls? Wear shoes that: Do not have high heels. Have rubber bottoms. Are comfortable and fit you well. Are closed at the toe. Do not wear sandals. If you use a stepladder: Make sure that it is fully opened. Do not climb a closed stepladder. Make sure that both sides of the stepladder are locked into place. Ask someone to hold it for you, if possible. Clearly mark and make sure that you can see: Any grab bars or  handrails. First and last steps. Where the edge of each step is. Use tools that help you move around (mobility aids) if they are needed. These include: Canes. Walkers. Scooters. Crutches. Turn on the lights when you go into a dark area. Replace any light bulbs as soon as they burn out. Set up your furniture so you have a clear path. Avoid moving your furniture around. If any of your floors are uneven, fix them. If there are any pets around you, be aware of where they are. Review your medicines with your doctor. Some medicines can make you feel dizzy. This can increase your chance of falling. Ask your doctor what other things that you can do to help prevent falls. This information is not intended to replace advice given to you by your health care provider. Make sure you discuss any questions you have with your health care provider. Document Released: 01/23/2009 Document Revised: 09/04/2015 Document Reviewed: 05/03/2014 Elsevier Interactive Patient Education  2017 [employer].

## 2021-06-15 ENCOUNTER — Other Ambulatory Visit: Payer: Self-pay

## 2021-06-15 ENCOUNTER — Ambulatory Visit: Payer: Self-pay | Admitting: *Deleted

## 2021-06-15 ENCOUNTER — Ambulatory Visit
Admission: RE | Admit: 2021-06-15 | Discharge: 2021-06-15 | Disposition: A | Discharge status: Home / Self Care | Payer: Medicare HMO | Source: Ambulatory Visit | Attending: Family Medicine | Admitting: Family Medicine

## 2021-06-15 ENCOUNTER — Ambulatory Visit (INDEPENDENT_AMBULATORY_CARE_PROVIDER_SITE_OTHER): Payer: Medicare HMO | Admitting: Family Medicine

## 2021-06-15 ENCOUNTER — Encounter: Payer: Self-pay | Admitting: Family Medicine

## 2021-06-15 VITALS — Resp 14 | Wt 120.9 lb

## 2021-06-15 DIAGNOSIS — R42 Dizziness and giddiness: Secondary | ICD-10-CM

## 2021-06-15 DIAGNOSIS — R270 Ataxia, unspecified: Secondary | ICD-10-CM

## 2021-06-15 NOTE — Telephone Encounter (Signed)
?  Chief Complaint: lightheadedness requesting appt  ?Symptoms: lightheaded, "swimmy headed" when sitting up or standing, looking up or down makes worse, very mild headache . Dizziness stops when laying down ?Frequency: started 06/13/21 Saturday  ?Pertinent Negatives: Patient denies chest pain ,difficulty breathing, fever, weakness on either side of body, no visual issues , speech clear.  ?Disposition: '[]'$ ED /'[]'$ Urgent Care (no appt availability in office) / '[x]'$ Appointment(In office/virtual)/ '[]'$  [facility]/ '[]'$ Home Care/ '[]'$ Refused Recommended Disposition /'[]'$  Mobile Bus/ '[]'$  Follow-up with PCP ?Additional Notes:  ? ?Recommended ED but patient did not want to sit in ED for hours and was able to contact [facility] and schedule appt for this am.  ? ? ? ? ? ? ? ? ? ? ? ? ? ? Reason for Disposition ? SEVERE dizziness (e.g., unable to stand, requires support to walk, feels like passing out now) ? ?Answer Assessment - Initial Assessment Questions ?1. DESCRIPTION: "Describe your dizziness." ?    Lightheaded, "swimmy headed" ?2. LIGHTHEADED: "Do you feel lightheaded?" (e.g., somewhat faint, woozy, weak upon standing) ?    Weak upon ?3. VERTIGO: "Do you feel like either you or the room is spinning or tilting?" (i.e. vertigo) ?    Yes ?4. SEVERITY: "How bad is it?"  "Do you feel like you are going to faint?" "Can you stand and walk?" ?  - MILD: Feels slightly dizzy, but walking normally. ?  - MODERATE: Feels unsteady when walking, but not falling; interferes with normal activities (e.g., school, work). ?  - SEVERE: Unable to walk without falling, or requires assistance to walk without falling; feels like passing out now.  ?    Severe, does not feel like passing out  ?5. ONSET:  "When did the dizziness begin?" ?    Saturday 06/13/21 ?6. AGGRAVATING FACTORS: "Does anything make it worse?" (e.g., standing, change in head position) ?    Looking up and down, standing  ?7. HEART RATE: "Can you tell me your heart rate?" "How  many beats in 15 seconds?"  (Note: not all patients can do this)   ?    na ?8. CAUSE: "What do you think is causing the dizziness?" ?    Not sure  ?9. RECURRENT SYMPTOM: "Have you had dizziness before?" If Yes, ask: "When was the last time?" "What happened that time?" ?    Yes  ?10. OTHER SYMPTOMS: "Do you have any other symptoms?" (e.g., fever, chest pain, vomiting, diarrhea, bleeding) ?      Mild headache no other symptoms  ?11. PREGNANCY: "Is there any chance you are pregnant?" "When was your last menstrual period?" ?      na ? ?Protocols used: Dizziness - Lightheadedness-A-AH ? ?

## 2021-06-15 NOTE — Progress Notes (Signed)
?  ? ? ?Established patient visit ? ? ?Patient: Sara Huynh   DOB: [birth year]   71 y.o. Female  MRN: [id number] ?Visit Date: 06/15/2021 ? ?Today's healthcare provider: Lelon Huh, MD  ? ?Chief Complaint  ?Patient presents with  ? Dizziness  ? ?Subjective  ?  ?Dizziness ?This is a new problem. The current episode started in the past 7 days (started on Saturday). The problem occurs constantly. The problem has been unchanged. Associated symptoms include fatigue, headaches, nausea and vertigo. Pertinent negatives include no abdominal pain, anorexia, arthralgias, change in bowel habit, chest pain, chills, congestion, coughing, diaphoresis, fever, joint swelling, myalgias, neck pain, numbness, rash, sore throat, swollen glands, urinary symptoms, visual change, vomiting or weakness. The symptoms are aggravated by standing, bending and walking (looking up or down). She has tried lying down for the symptoms. The treatment provided no relief.   ? ?She reports she has had vertigo in the past with symptoms of spinning sensation, but those episodes were very different that her current symptoms. She states she is feeling more and more off balance as the day progresses.  ? ?Medications: ?Outpatient Medications Prior to Visit  ?Medication Sig  ? acetaminophen (TYLENOL) 500 MG tablet Take 1,000 mg by mouth every 8 (eight) hours as needed.   ? amLODipine (NORVASC) 2.5 MG tablet Take 1 tablet (2.5 mg total) by mouth every evening.  ? Cholecalciferol (VITAMIN D3) 125 MCG (5000 UT) CAPS Take by mouth.  ? escitalopram (LEXAPRO) 10 MG tablet Take 1 tablet (10 mg total) by mouth daily.  ? HYDROcodone-acetaminophen (NORCO/VICODIN) 5-325 MG tablet Take 1 tablet by mouth as needed.  ? lisinopril (ZESTRIL) 10 MG tablet Take 1 tablet (10 mg total) by mouth daily.  ? omeprazole-sodium bicarbonate (ZEGERID) 40-1100 MG capsule Take 1 capsule by mouth 2 (two) times daily.   ? ondansetron (ZOFRAN) 8 MG tablet Take 1 tablet by mouth as needed.   ? polyethylene glycol (MIRALAX / GLYCOLAX) packet Take 17 g by mouth daily as needed.  ? simvastatin (ZOCOR) 20 MG tablet Take 1 tablet (20 mg total) by mouth at bedtime.  ? sucralfate (CARAFATE) 1 g tablet Take 1 g by mouth 4 (four) times daily.   ? traZODone (DESYREL) 50 MG tablet Take 1-2 tablets (50-100 mg total) by mouth at bedtime as needed for sleep.  ? ?No facility-administered medications prior to visit.  ? ? ?Review of Systems  ?Constitutional:  Positive for fatigue. Negative for chills, diaphoresis and fever.  ?HENT:  Negative for congestion and sore throat.   ?Respiratory:  Negative for cough.   ?Cardiovascular:  Negative for chest pain.  ?Gastrointestinal:  Positive for nausea. Negative for abdominal pain, anorexia, change in bowel habit and vomiting.  ?Musculoskeletal:  Negative for arthralgias, joint swelling, myalgias and neck pain.  ?Skin:  Negative for rash.  ?Neurological:  Positive for dizziness, vertigo and headaches. Negative for weakness and numbness.  ? ? ?  Objective  ?  ?Resp 14   Wt 120 lb 14.4 oz (54.8 kg)   BMI 23.61 kg/m?  ? ? ?Physical Exam  ? ? ?General: Appearance:    Well developed, well nourished female in no acute distress  ?Eyes:    PERRL, conjunctiva/corneas clear, EOM's intact  No nystagmus.   ?Lungs:     Clear to auscultation bilaterally, respirations unlabored  ?Heart:    Normal heart rate. Normal rhythm. No murmurs, rubs, or gallops.    ?MS:   All extremities are intact.    ?  Neurologic:   Awake, alert, oriented x 3. Unsteady walking gait. Slow to perform finger to nose. Positive Romberg.   ?   ?  ? Assessment & Plan  ?  ? ?1. Dizziness ?Not typical vertigo. Concerning for CNS lesion.  ?- Comprehensive metabolic panel ?- CBC w/Diff/Platelet ?- CT HEAD WO CONTRAST (5MM); Future (STAT) ? ?2. Ataxia ? ?- Comprehensive metabolic panel ?- CBC w/Diff/Platelet ?- CT HEAD WO CONTRAST (5MM); Future  ?  ?   ? ?The entirety of the information documented in the History of Present  Illness, Review of Systems and Physical Exam were personally obtained by me. Portions of this information were initially documented by the CMA and reviewed by me for thoroughness and accuracy.   ? ? ?Lelon Huh, MD  ?[facility] ?[phone] (phone) ?[phone] (fax) ? ?[facility]  ?

## 2021-06-15 NOTE — Telephone Encounter (Signed)
Patient was seen today.

## 2021-06-16 ENCOUNTER — Ambulatory Visit: Payer: Medicare HMO | Admitting: Family Medicine

## 2021-06-16 ENCOUNTER — Ambulatory Visit: Payer: Self-pay

## 2021-06-16 ENCOUNTER — Encounter: Payer: Self-pay | Admitting: Family Medicine

## 2021-06-16 LAB — COMPREHENSIVE METABOLIC PANEL
ALT: 13 IU/L (ref 0–32)
AST: 19 IU/L (ref 0–40)
Albumin/Globulin Ratio: 2.4 — ABNORMAL HIGH (ref 1.2–2.2)
Albumin: 4.7 g/dL (ref 3.8–4.8)
Alkaline Phosphatase: 62 IU/L (ref 44–121)
BUN/Creatinine Ratio: 12 (ref 12–28)
BUN: 12 mg/dL (ref 8–27)
Bilirubin Total: 0.7 mg/dL (ref 0.0–1.2)
CO2: 25 mmol/L (ref 20–29)
Calcium: 10 mg/dL (ref 8.7–10.3)
Chloride: 100 mmol/L (ref 96–106)
Creatinine, Ser: 1.01 mg/dL — ABNORMAL HIGH (ref 0.57–1.00)
Globulin, Total: 2 g/dL (ref 1.5–4.5)
Glucose: 89 mg/dL (ref 70–99)
Potassium: 4.3 mmol/L (ref 3.5–5.2)
Sodium: 140 mmol/L (ref 134–144)
Total Protein: 6.7 g/dL (ref 6.0–8.5)
eGFR: 60 mL/min/{1.73_m2} (ref >59)

## 2021-06-16 LAB — CBC WITH DIFFERENTIAL/PLATELET
Basophils Absolute: 0.1 10*3/uL (ref 0.0–0.2)
Basos: 1 %
EOS (ABSOLUTE): 0.3 10*3/uL (ref 0.0–0.4)
Eos: 3 %
Hematocrit: 35.9 % (ref 34.0–46.6)
Hemoglobin: 11.9 g/dL (ref 11.1–15.9)
Immature Grans (Abs): 0 10*3/uL (ref 0.0–0.1)
Immature Granulocytes: 0 %
Lymphocytes Absolute: 2.9 10*3/uL (ref 0.7–3.1)
Lymphs: 29 %
MCH: 27.4 pg (ref 26.6–33.0)
MCHC: 33.1 g/dL (ref 31.5–35.7)
MCV: 83 fL (ref 79–97)
Monocytes Absolute: 1 10*3/uL — ABNORMAL HIGH (ref 0.1–0.9)
Monocytes: 10 %
Neutrophils Absolute: 5.7 10*3/uL (ref 1.4–7.0)
Neutrophils: 57 %
Platelets: 255 10*3/uL (ref 150–450)
RBC: 4.35 x10E6/uL (ref 3.77–5.28)
RDW: 14.1 % (ref 11.7–15.4)
WBC: 9.9 10*3/uL (ref 3.4–10.8)

## 2021-06-16 NOTE — Telephone Encounter (Signed)
? ?  Chief Complaint: returning our call ?Symptoms:  ?Frequency:  ?Pertinent Negatives: Patient denies  ?Disposition: '[]'$ ED /'[]'$ Urgent Care (no appt availability in office) / '[]'$ Appointment(In office/virtual)/ '[]'$  [facility]/ '[]'$ Home Care/ '[]'$ Refused Recommended Disposition /'[]'$ [place] [facility]/ '[]'$  Follow-up with PCP ?Additional Notes: Pt returned call. I do not see any information that [facility] needs to give to pt. Please advise. ? ? ? ? ? ? ? ? ?Reason for Disposition ? [1] Follow-up call to recent contact AND [2] information only call, no triage required ? ?Answer Assessment - Initial Assessment Questions ?1. REASON FOR CALL or QUESTION: "What is your reason for calling today?" or "How can I best help you?" or "What question do you have that I can help answer?" ?    Pt is returning our call. Pt thinks it's from today. ? ?Protocols used: Information Only Call - No Triage-A-AH ? ?

## 2021-08-03 DIAGNOSIS — L570 Actinic keratosis: Secondary | ICD-10-CM | POA: Diagnosis not present

## 2021-08-03 DIAGNOSIS — D2271 Melanocytic nevi of right lower limb, including hip: Secondary | ICD-10-CM | POA: Diagnosis not present

## 2021-08-03 DIAGNOSIS — D2262 Melanocytic nevi of left upper limb, including shoulder: Secondary | ICD-10-CM | POA: Diagnosis not present

## 2021-08-03 DIAGNOSIS — D0439 Carcinoma in situ of skin of other parts of face: Secondary | ICD-10-CM | POA: Diagnosis not present

## 2021-08-03 DIAGNOSIS — D225 Melanocytic nevi of trunk: Secondary | ICD-10-CM | POA: Diagnosis not present

## 2021-08-03 DIAGNOSIS — C44519 Basal cell carcinoma of skin of other part of trunk: Secondary | ICD-10-CM | POA: Diagnosis not present

## 2021-08-03 DIAGNOSIS — D045 Carcinoma in situ of skin of trunk: Secondary | ICD-10-CM | POA: Diagnosis not present

## 2021-08-03 DIAGNOSIS — D2261 Melanocytic nevi of right upper limb, including shoulder: Secondary | ICD-10-CM | POA: Diagnosis not present

## 2021-08-03 DIAGNOSIS — L821 Other seborrheic keratosis: Secondary | ICD-10-CM | POA: Diagnosis not present

## 2021-08-03 DIAGNOSIS — D2272 Melanocytic nevi of left lower limb, including hip: Secondary | ICD-10-CM | POA: Diagnosis not present

## 2021-08-03 DIAGNOSIS — D485 Neoplasm of uncertain behavior of skin: Secondary | ICD-10-CM | POA: Diagnosis not present

## 2021-08-03 DIAGNOSIS — X32XXXA Exposure to sunlight, initial encounter: Secondary | ICD-10-CM | POA: Diagnosis not present

## 2021-08-06 DIAGNOSIS — R110 Nausea: Secondary | ICD-10-CM | POA: Diagnosis not present

## 2021-08-06 DIAGNOSIS — K3184 Gastroparesis: Secondary | ICD-10-CM | POA: Diagnosis not present

## 2021-08-06 DIAGNOSIS — Z9889 Other specified postprocedural states: Secondary | ICD-10-CM | POA: Diagnosis not present

## 2021-08-06 DIAGNOSIS — K3189 Other diseases of stomach and duodenum: Secondary | ICD-10-CM | POA: Diagnosis not present

## 2021-09-05 ENCOUNTER — Other Ambulatory Visit: Payer: Self-pay | Admitting: Family Medicine

## 2021-09-05 DIAGNOSIS — F439 Reaction to severe stress, unspecified: Secondary | ICD-10-CM

## 2021-09-05 DIAGNOSIS — I10 Essential (primary) hypertension: Secondary | ICD-10-CM

## 2021-09-05 DIAGNOSIS — E7800 Pure hypercholesterolemia, unspecified: Secondary | ICD-10-CM

## 2021-09-24 DIAGNOSIS — R69 Illness, unspecified: Secondary | ICD-10-CM | POA: Diagnosis not present

## 2021-10-16 DIAGNOSIS — H04123 Dry eye syndrome of bilateral lacrimal glands: Secondary | ICD-10-CM | POA: Diagnosis not present

## 2021-12-11 DIAGNOSIS — D045 Carcinoma in situ of skin of trunk: Secondary | ICD-10-CM | POA: Diagnosis not present

## 2021-12-11 DIAGNOSIS — D0439 Carcinoma in situ of skin of other parts of face: Secondary | ICD-10-CM | POA: Diagnosis not present

## 2021-12-11 DIAGNOSIS — C44519 Basal cell carcinoma of skin of other part of trunk: Secondary | ICD-10-CM | POA: Diagnosis not present

## 2022-01-30 ENCOUNTER — Other Ambulatory Visit: Payer: Self-pay | Admitting: Family Medicine

## 2022-01-30 DIAGNOSIS — I10 Essential (primary) hypertension: Secondary | ICD-10-CM

## 2022-01-30 DIAGNOSIS — F439 Reaction to severe stress, unspecified: Secondary | ICD-10-CM

## 2022-03-19 DIAGNOSIS — K3184 Gastroparesis: Secondary | ICD-10-CM | POA: Diagnosis not present

## 2022-03-19 DIAGNOSIS — T18198A Other foreign object in esophagus causing other injury, initial encounter: Secondary | ICD-10-CM | POA: Diagnosis not present

## 2022-03-19 DIAGNOSIS — K2970 Gastritis, unspecified, without bleeding: Secondary | ICD-10-CM | POA: Diagnosis not present

## 2022-03-19 DIAGNOSIS — R110 Nausea: Secondary | ICD-10-CM | POA: Diagnosis not present

## 2022-03-19 DIAGNOSIS — K222 Esophageal obstruction: Secondary | ICD-10-CM | POA: Diagnosis not present

## 2022-04-15 ENCOUNTER — Other Ambulatory Visit: Payer: Self-pay | Admitting: Family Medicine

## 2022-04-15 DIAGNOSIS — E7800 Pure hypercholesterolemia, unspecified: Secondary | ICD-10-CM

## 2022-05-05 DIAGNOSIS — Z20822 Contact with and (suspected) exposure to covid-19: Secondary | ICD-10-CM | POA: Diagnosis not present

## 2022-05-05 DIAGNOSIS — R0981 Nasal congestion: Secondary | ICD-10-CM | POA: Diagnosis not present

## 2022-05-06 DIAGNOSIS — Z20822 Contact with and (suspected) exposure to covid-19: Secondary | ICD-10-CM | POA: Diagnosis not present

## 2022-05-06 DIAGNOSIS — R0981 Nasal congestion: Secondary | ICD-10-CM | POA: Diagnosis not present

## 2022-06-01 ENCOUNTER — Telehealth: Payer: Self-pay | Admitting: Family Medicine

## 2022-06-01 NOTE — Telephone Encounter (Signed)
Contacted Jeral Pinch to schedule their annual wellness visit. Appointment made for 06/30/2022.  [place] Direct Dial: [phone]

## 2022-06-15 DIAGNOSIS — Z20822 Contact with and (suspected) exposure to covid-19: Secondary | ICD-10-CM | POA: Diagnosis not present

## 2022-06-15 DIAGNOSIS — R0981 Nasal congestion: Secondary | ICD-10-CM | POA: Diagnosis not present

## 2022-06-17 DIAGNOSIS — R0981 Nasal congestion: Secondary | ICD-10-CM | POA: Diagnosis not present

## 2022-06-17 DIAGNOSIS — J309 Allergic rhinitis, unspecified: Secondary | ICD-10-CM | POA: Diagnosis not present

## 2022-06-17 DIAGNOSIS — Z20822 Contact with and (suspected) exposure to covid-19: Secondary | ICD-10-CM | POA: Diagnosis not present

## 2022-06-19 DIAGNOSIS — R0981 Nasal congestion: Secondary | ICD-10-CM | POA: Diagnosis not present

## 2022-06-19 DIAGNOSIS — Z20822 Contact with and (suspected) exposure to covid-19: Secondary | ICD-10-CM | POA: Diagnosis not present

## 2022-06-20 DIAGNOSIS — R0981 Nasal congestion: Secondary | ICD-10-CM | POA: Diagnosis not present

## 2022-06-20 DIAGNOSIS — Z20822 Contact with and (suspected) exposure to covid-19: Secondary | ICD-10-CM | POA: Diagnosis not present

## 2022-06-23 DIAGNOSIS — R0981 Nasal congestion: Secondary | ICD-10-CM | POA: Diagnosis not present

## 2022-06-23 DIAGNOSIS — Z20822 Contact with and (suspected) exposure to covid-19: Secondary | ICD-10-CM | POA: Diagnosis not present

## 2022-06-28 ENCOUNTER — Other Ambulatory Visit: Payer: Self-pay | Admitting: Family Medicine

## 2022-06-28 DIAGNOSIS — E7800 Pure hypercholesterolemia, unspecified: Secondary | ICD-10-CM

## 2022-06-28 DIAGNOSIS — I10 Essential (primary) hypertension: Secondary | ICD-10-CM

## 2022-06-28 DIAGNOSIS — F439 Reaction to severe stress, unspecified: Secondary | ICD-10-CM

## 2022-06-29 DIAGNOSIS — Z20822 Contact with and (suspected) exposure to covid-19: Secondary | ICD-10-CM | POA: Diagnosis not present

## 2022-06-29 DIAGNOSIS — R0981 Nasal congestion: Secondary | ICD-10-CM | POA: Diagnosis not present

## 2022-06-30 ENCOUNTER — Ambulatory Visit (INDEPENDENT_AMBULATORY_CARE_PROVIDER_SITE_OTHER): Payer: Medicare HMO

## 2022-06-30 VITALS — Ht 60.0 in | Wt 120.0 lb

## 2022-06-30 DIAGNOSIS — Z13820 Encounter for screening for osteoporosis: Secondary | ICD-10-CM

## 2022-06-30 DIAGNOSIS — Z0000 Encounter for general adult medical examination without abnormal findings: Secondary | ICD-10-CM

## 2022-06-30 DIAGNOSIS — Z1231 Encounter for screening mammogram for malignant neoplasm of breast: Secondary | ICD-10-CM

## 2022-06-30 NOTE — Progress Notes (Signed)
I connected with  Sara Huynh on 06/30/22 by a audio enabled telemedicine application and verified that I am speaking with the correct person using two identifiers.  Patient Location: Home  Provider Location: Office/Clinic  I discussed the limitations of evaluation and management by telemedicine. The patient expressed understanding and agreed to proceed.  Subjective:   Sara Huynh is a 72 y.o. female who presents for Medicare Annual (Subsequent) preventive examination.  Review of Systems    Cardiac Risk Factors include: advanced age (>37men, >72 women);dyslipidemia;hypertension    Objective:    Today's Vitals   06/30/22 0935  Weight: 120 lb (54.4 kg)  Height: 5' (1.524 m)   Body mass index is 23.44 kg/m.     06/30/2022    9:40 AM 06/10/2021   11:06 AM 10/30/2019    9:28 AM 10/18/2018   11:04 AM 09/16/2017    8:48 AM 04/13/2017    4:07 PM 08/25/2016    9:40 AM  Advanced Directives  Does Patient Have a Medical Advance Directive? Yes No Yes Yes Yes No Yes  Type of Paramedic of [place];Living will  [place];Living will [place];Living will Healthcare Power of [place]  Does patient want to make changes to medical advance directive?       Yes (ED - Information included in AVS)  Copy of [place] in Chart?   No - copy requested No - copy requested No - copy requested  No - copy requested  Would patient like information on creating a medical advance directive?  No - Patient declined    No - Patient declined     Current Medications (verified) Outpatient Encounter Medications as of 06/30/2022  Medication Sig   acetaminophen (TYLENOL) 500 MG tablet Take 1,000 mg by mouth every 8 (eight) hours as needed.    amLODipine (NORVASC) 2.5 MG tablet TAKE 1 TABLET (2.5 MG TOTAL) BY MOUTH EVERY EVENING.   Cholecalciferol (VITAMIN D3) 125 MCG (5000 UT) CAPS Take by mouth.    escitalopram (LEXAPRO) 10 MG tablet TAKE 1 TABLET EVERY DAY (NEED TO SCHEDULE OFFICE VISIT FOR FOLLOW UP)   HYDROcodone-acetaminophen (NORCO/VICODIN) 5-325 MG tablet Take 1 tablet by mouth as needed. (Patient not taking: Reported on 06/30/2022)   lisinopril (ZESTRIL) 10 MG tablet TAKE 1 TABLET EVERY DAY (APPOINTMENT IS NEEDED)   omeprazole-sodium bicarbonate (ZEGERID) 40-1100 MG capsule Take 1 capsule by mouth 2 (two) times daily.    ondansetron (ZOFRAN) 8 MG tablet Take 1 tablet by mouth as needed.   polyethylene glycol (MIRALAX / GLYCOLAX) packet Take 17 g by mouth daily as needed.   simvastatin (ZOCOR) 20 MG tablet TAKE 1 TABLET AT BEDTIME (PLEASE SCHEDULE OFFICE VISIT FOR FURTHER REFILLS)   sucralfate (CARAFATE) 1 g tablet Take 1 g by mouth 4 (four) times daily.    traZODone (DESYREL) 50 MG tablet Take 1-2 tablets (50-100 mg total) by mouth at bedtime as needed for sleep.   No facility-administered encounter medications on file as of 06/30/2022.    Allergies (verified) Alendronate, Dicyclomine, and Prednisone   History: Past Medical History:  Diagnosis Date   Allergy    Anxiety    Arthritis    GERD (gastroesophageal reflux disease)    Hyperlipidemia    Hypertension    Neuromuscular disorder ([place])    Osteoporosis    Shingles 12/18/2014   Ulcer    Past Surgical History:  Procedure Laterality Date  ABDOMINAL HYSTERECTOMY  1986   APPENDECTOMY     CHOLECYSTECTOMY  03/2009   ESOPHAGEAL DILATION     EYE SURGERY     G tube placed for 18 mths   2007   gi pacemaker  2002   NASAL SINUS SURGERY  2005   OOPHORECTOMY     PYLOROPLASTY  07/15/2015   Family History  Problem Relation Age of Onset   CAD Mother    Heart disease Mother        MI   Dementia Mother    Alzheimer's disease Mother    CVA Father    COPD Father    Emphysema Father    Breast cancer Neg Hx    Social History   Socioeconomic History   Marital status: Widowed    Spouse name: Not on file   Number of  children: 3   Years of education: Not on file   Highest education level: Some college, no degree  Occupational History   Occupation: retired  Tobacco Use   Smoking status: Never   Smokeless tobacco: Never  Vaping Use   Vaping Use: Never used  Substance and Sexual Activity   Alcohol use: No   Drug use: No   Sexual activity: Not on file  Other Topics Concern   Not on file  Social History Narrative   Not on file   Social Determinants of Health   Financial Resource Strain: Low Risk  (06/29/2022)   Overall Financial Resource Strain (CARDIA)    Difficulty of Paying Living Expenses: Not hard at all  Food Insecurity: No Food Insecurity (06/29/2022)   Hunger Vital Sign    Worried About Running Out of Food in the Last Year: Never true    [place] in the Last Year: Never true  Transportation Needs: No Transportation Needs (06/29/2022)   PRAPARE - Hydrologist (Medical): No    Lack of Transportation (Non-Medical): No  Physical Activity: Insufficiently Active (06/29/2022)   Exercise Vital Sign    Days of Exercise per Week: 4 days    Minutes of Exercise per Session: 20 min  Stress: No Stress Concern Present (06/29/2022)   [place]    Feeling of Stress : Not at all  Social Connections: Unknown (06/29/2022)   Social Connection and Isolation Panel [NHANES]    Frequency of Communication with Friends and Family: More than three times a week    Frequency of Social Gatherings with Friends and Family: Three times a week    Attends Religious Services: Not on file    Active Member of Clubs or Organizations: Yes    Attends Club or Organization Meetings: More than 4 times per year    Marital Status: Widowed    Tobacco Counseling Counseling given: Not Answered   Clinical Intake:  Pre-visit preparation completed: Yes  Pain : No/denies pain     BMI - recorded: 23.44 Nutritional Status:  BMI of 19-24  Normal Nutritional Risks: None Diabetes: No  How often do you need to have someone help you when you read instructions, pamphlets, or other written materials from your doctor or pharmacy?: 1 - Never  Diabetic?no  Interpreter Needed?: No  Comments: lives alone;husband passed last fall Information entered by :: B.Suzane Vanderweide,LPN   Activities of Daily Living    06/29/2022   11:02 AM  In your present state of health, do you have any difficulty performing the following activities:  Hearing? 0  Vision? 0  Difficulty concentrating or making decisions? 0  Walking or climbing stairs? 0  Dressing or bathing? 0  Doing errands, shopping? 0  Preparing Food and eating ? N  Using the Toilet? N  In the past six months, have you accidently leaked urine? N  Do you have problems with loss of bowel control? N  Managing your Medications? N  Managing your Finances? N  Housekeeping or managing your Housekeeping? N    Patient Care Team: Birdie Sons, MD as PCP - General (Family Medicine) Leandrew Koyanagi, MD as Referring Physician (Ophthalmology) Scherrie November, MD as Referring Physician (Gastroenterology)  Indicate any recent Medical Services you may have received from other than [facility] providers in the past year (date may be approximate).     Assessment:   This is a routine wellness examination for [place].  Hearing/Vision screen Hearing Screening - Comments:: Adequate hearing Vision Screening - Comments:: Adequate vision only readers [facility] Mordecai Rasmussen   Dietary issues and exercise activities discussed: Current Exercise Habits: Home exercise routine, Type of exercise: walking, Time (Minutes): 20, Frequency (Times/Week): 4, Weekly Exercise (Minutes/Week): 80, Intensity: Mild, Exercise limited by: Other - see comments (Scleroderma)   Goals Addressed             This Visit's Progress    DIET - EAT MORE FRUITS AND VEGETABLES   On track    Increase water  intake   On track    Recommend increasing water intake to 4 glasses a day.       Depression Screen    06/30/2022    9:38 AM 06/10/2021   11:04 AM 01/21/2021    1:30 PM 06/17/2020   11:35 AM 10/30/2019    9:26 AM 10/18/2018   11:04 AM 09/16/2017    8:48 AM  PHQ 2/9 Scores  PHQ - 2 Score 0 0 0 0 0 0 0  PHQ- 9 Score    2       Fall Risk    06/29/2022   11:02 AM 06/10/2021   11:07 AM 01/21/2021    1:30 PM 06/17/2020   11:38 AM 10/30/2019    9:28 AM  Fall Risk   Falls in the past year? 0 0 0 0 0  Number falls in past yr: 0 0 0 0 0  Injury with Fall? 0 0 0 0 0  Risk for fall due to :  No Fall Risks     Follow up  Falls evaluation completed  Falls evaluation completed     FALL RISK PREVENTION PERTAINING TO THE HOME:  Any stairs in or around the home? Yes  If so, are there any without handrails? Yes  Home free of loose throw rugs in walkways, pet beds, electrical cords, etc? Yes  Adequate lighting in your home to reduce risk of falls? Yes   ASSISTIVE DEVICES UTILIZED TO PREVENT FALLS:  Life alert? No  Use of a cane, walker or w/c? No  Grab bars in the bathroom? No  Shower chair or bench in shower? No  Elevated toilet seat or a handicapped toilet? No   Cognitive Function:        06/30/2022    9:41 AM 10/18/2018   11:07 AM 08/25/2016    9:45 AM  6CIT Screen  What Year? 0 points 0 points 0 points  What month? 0 points 0 points 0 points  What time? 0 points 0 points 0 points  Count back from 20 0 points  0 points 0 points  Months in reverse 0 points 0 points 0 points  Repeat phrase 0 points 0 points 4 points  Total Score 0 points 0 points 4 points    Immunizations Immunization History  Administered Date(s) Administered   Fluad Quad(high Dose 65+) 01/01/2019, 01/23/2020, 01/13/2021, 01/22/2022   Influenza, High Dose Seasonal PF 01/03/2016, 01/08/2017, 01/28/2018   Influenza,inj,Quad PF,6+ Mos 01/11/2015   PFIZER Comirnaty(Gray Top)Covid-19 Tri-Sucrose Vaccine 05/23/2020    PFIZER(Purple Top)SARS-COV-2 Vaccination 05/25/2019, 06/19/2019   Pneumococcal Conjugate-13 01/03/2016   Pneumococcal Polysaccharide-23 01/08/2017   Tdap 10/05/2010   Zoster Recombinat (Shingrix) 05/19/2021, 07/21/2021    TDAP status: Up to date  Flu Vaccine status: Up to date  Pneumococcal vaccine status: Up to date  Covid-19 vaccine status: Completed vaccines  Qualifies for Shingles Vaccine? Yes   Zostavax completed Yes   Shingrix Completed?: Yes  Screening Tests Health Maintenance  Topic Date Due   DEXA SCAN  11/03/2019   DTaP/Tdap/Td (2 - Td or Tdap) 10/04/2020   MAMMOGRAM  02/10/2021   COVID-19 Vaccine (4 - 2023-24 season) 12/11/2021   Medicare Annual Wellness (AWV)  06/30/2023   COLONOSCOPY (Pts 45-38yrs Insurance coverage will need to be confirmed)  07/11/2024   Pneumonia Vaccine 10+ Years old  Completed   INFLUENZA VACCINE  Completed   Hepatitis C Screening  Completed   Zoster Vaccines- Shingrix  Completed   HPV VACCINES  Aged Out    Health Maintenance  Health Maintenance Due  Topic Date Due   DEXA SCAN  11/03/2019   DTaP/Tdap/Td (2 - Td or Tdap) 10/04/2020   MAMMOGRAM  02/10/2021   COVID-19 Vaccine (4 - 2023-24 season) 12/11/2021    Colorectal cancer screening: yes due 2026; will talk with PCP  Mammogram status: Ordered yes. Pt provided with contact info and advised to call to schedule appt.   Bone Density status: Ordered yes. Pt provided with contact info and advised to call to schedule appt.  Lung Cancer Screening: (Low Dose CT Chest recommended if Age 26-80 years, 30 pack-year currently smoking OR have quit w/in 15years.) does not qualify.   Lung Cancer Screening Referral: no  Additional Screening:  Hepatitis C Screening: does not qualify; Completed yes  Vision Screening: Recommended annual ophthalmology exams for early detection of glaucoma and other disorders of the eye. Is the patient up to date with their annual eye exam?  Yes  Who is the  provider or what is the name of the office in which the patient attends annual eye exams? [facility] If pt is not established with a provider, would they like to be referred to a provider to establish care? No .   Dental Screening: Recommended annual dental exams for proper oral hygiene  Community Resource Referral / Chronic Care Management: CRR required this visit?  No   CCM required this visit?  No      Plan:     I have personally reviewed and noted the following in the patient's chart:   Medical and social history Use of alcohol, tobacco or illicit drugs  Current medications and supplements including opioid prescriptions. Patient is not currently taking opioid prescriptions. Functional ability and status Nutritional status Physical activity Advanced directives List of other physicians Hospitalizations, surgeries, and ER visits in previous 12 months Vitals Screenings to include cognitive, depression, and falls Referrals and appointments  In addition, I have reviewed and discussed with patient certain preventive protocols, quality metrics, and best practice recommendations. A written personalized care plan for  preventive services as well as general preventive health recommendations were provided to patient.     Roger Shelter, LPN   [id number]   Nurse Notes: pt is doing well: still grieving the loss of her husband in the fall of last year and daughter in-law just entering hospice this week. Pt denies feeling down or difficulty with caring for herself. She has no concerns or questions for this visit. Care gaps ordered:pt relays she put things off caring for her husband but will pursue completion of them.  *Order for Dexa Scan and MMG done*

## 2022-06-30 NOTE — Patient Instructions (Signed)
[name] , Thank you for taking time to come for your Medicare Wellness Visit. I appreciate your ongoing commitment to your health goals. Please review the following plan we discussed and let me know if I can assist you in the future.   These are the goals we discussed:  Goals      DIET - EAT MORE FRUITS AND VEGETABLES     Increase water intake     Recommend increasing water intake to 4 glasses a day.        This is a list of the screening recommended for you and due dates:  Health Maintenance  Topic Date Due   DEXA scan (bone density measurement)  11/03/2019   DTaP/Tdap/Td vaccine (2 - Td or Tdap) 10/04/2020   Mammogram  02/10/2021   COVID-19 Vaccine (4 - 2023-24 season) 12/11/2021   Medicare Annual Wellness Visit  06/30/2023   Colon Cancer Screening  07/11/2024   Pneumonia Vaccine  Completed   Flu Shot  Completed   Hepatitis C Screening: USPSTF Recommendation to screen - Ages 18-79 yo.  Completed   Zoster (Shingles) Vaccine  Completed   HPV Vaccine  Aged Out    Advanced directives: yes  Conditions/risks identified: none  Next appointment: Follow up in one year for your annual wellness visit 07/04/2023 @ 11:15am telephone   Preventive Care 65 Years and Older, Female Preventive care refers to lifestyle choices and visits with your health care provider that can promote health and wellness. What does preventive care include? A yearly physical exam. This is also called an annual well check. Dental exams once or twice a year. Routine eye exams. Ask your health care provider how often you should have your eyes checked. Personal lifestyle choices, including: Daily care of your teeth and gums. Regular physical activity. Eating a healthy diet. Avoiding tobacco and drug use. Limiting alcohol use. Practicing safe sex. Taking low-dose aspirin every day. Taking vitamin and mineral supplements as recommended by your health care provider. What happens during an annual well  check? The services and screenings done by your health care provider during your annual well check will depend on your age, overall health, lifestyle risk factors, and family history of disease. Counseling  Your health care provider may ask you questions about your: Alcohol use. Tobacco use. Drug use. Emotional well-being. Home and relationship well-being. Sexual activity. Eating habits. History of falls. Memory and ability to understand (cognition). Work and work Statistician. Reproductive health. Screening  You may have the following tests or measurements: Height, weight, and BMI. Blood pressure. Lipid and cholesterol levels. These may be checked every 5 years, or more frequently if you are over 23 years old. Skin check. Lung cancer screening. You may have this screening every year starting at age 21 if you have a 30-pack-year history of smoking and currently smoke or have quit within the past 15 years. Fecal occult blood test (FOBT) of the stool. You may have this test every year starting at age 4. Flexible sigmoidoscopy or colonoscopy. You may have a sigmoidoscopy every 5 years or a colonoscopy every 10 years starting at age 38. Hepatitis C blood test. Hepatitis B blood test. Sexually transmitted disease (STD) testing. Diabetes screening. This is done by checking your blood sugar (glucose) after you have not eaten for a while (fasting). You may have this done every 1-3 years. Bone density scan. This is done to screen for osteoporosis. You may have this done starting at age 67. Mammogram. This may be  done every 1-2 years. Talk to your health care provider about how often you should have regular mammograms. Talk with your health care provider about your test results, treatment options, and if necessary, the need for more tests. Vaccines  Your health care provider may recommend certain vaccines, such as: Influenza vaccine. This is recommended every year. Tetanus, diphtheria, and  acellular pertussis (Tdap, Td) vaccine. You may need a Td booster every 10 years. Zoster vaccine. You may need this after age 40. Pneumococcal 13-valent conjugate (PCV13) vaccine. One dose is recommended after age 103. Pneumococcal polysaccharide (PPSV23) vaccine. One dose is recommended after age 37. Talk to your health care provider about which screenings and vaccines you need and how often you need them. This information is not intended to replace advice given to you by your health care provider. Make sure you discuss any questions you have with your health care provider. Document Released: 04/25/2015 Document Revised: 12/17/2015 Document Reviewed: 01/28/2015 Elsevier Interactive Patient Education  2017 [place] Prevention in the Home Falls can cause injuries. They can happen to people of all ages. There are many things you can do to make your home safe and to help prevent falls. What can I do on the outside of my home? Regularly fix the edges of walkways and driveways and fix any cracks. Remove anything that might make you trip as you walk through a door, such as a raised step or threshold. Trim any bushes or trees on the path to your home. Use bright outdoor lighting. Clear any walking paths of anything that might make someone trip, such as rocks or tools. Regularly check to see if handrails are loose or broken. Make sure that both sides of any steps have handrails. Any raised decks and porches should have guardrails on the edges. Have any leaves, snow, or ice cleared regularly. Use sand or salt on walking paths during winter. Clean up any spills in your garage right away. This includes oil or grease spills. What can I do in the bathroom? Use night lights. Install grab bars by the toilet and in the tub and shower. Do not use towel bars as grab bars. Use non-skid mats or decals in the tub or shower. If you need to sit down in the shower, use a plastic, non-slip stool. Keep  the floor dry. Clean up any water that spills on the floor as soon as it happens. Remove soap buildup in the tub or shower regularly. Attach bath mats securely with double-sided non-slip rug tape. Do not have throw rugs and other things on the floor that can make you trip. What can I do in the bedroom? Use night lights. Make sure that you have a light by your bed that is easy to reach. Do not use any sheets or blankets that are too big for your bed. They should not hang down onto the floor. Have a firm chair that has side arms. You can use this for support while you get dressed. Do not have throw rugs and other things on the floor that can make you trip. What can I do in the kitchen? Clean up any spills right away. Avoid walking on wet floors. Keep items that you use a lot in easy-to-reach places. If you need to reach something above you, use a strong step stool that has a grab bar. Keep electrical cords out of the way. Do not use floor polish or wax that makes floors slippery. If you must use wax,  use non-skid floor wax. Do not have throw rugs and other things on the floor that can make you trip. What can I do with my stairs? Do not leave any items on the stairs. Make sure that there are handrails on both sides of the stairs and use them. Fix handrails that are broken or loose. Make sure that handrails are as long as the stairways. Check any carpeting to make sure that it is firmly attached to the stairs. Fix any carpet that is loose or worn. Avoid having throw rugs at the top or bottom of the stairs. If you do have throw rugs, attach them to the floor with carpet tape. Make sure that you have a light switch at the top of the stairs and the bottom of the stairs. If you do not have them, ask someone to add them for you. What else can I do to help prevent falls? Wear shoes that: Do not have high heels. Have rubber bottoms. Are comfortable and fit you well. Are closed at the toe. Do not  wear sandals. If you use a stepladder: Make sure that it is fully opened. Do not climb a closed stepladder. Make sure that both sides of the stepladder are locked into place. Ask someone to hold it for you, if possible. Clearly mark and make sure that you can see: Any grab bars or handrails. First and last steps. Where the edge of each step is. Use tools that help you move around (mobility aids) if they are needed. These include: Canes. Walkers. Scooters. Crutches. Turn on the lights when you go into a dark area. Replace any light bulbs as soon as they burn out. Set up your furniture so you have a clear path. Avoid moving your furniture around. If any of your floors are uneven, fix them. If there are any pets around you, be aware of where they are. Review your medicines with your doctor. Some medicines can make you feel dizzy. This can increase your chance of falling. Ask your doctor what other things that you can do to help prevent falls. This information is not intended to replace advice given to you by your health care provider. Make sure you discuss any questions you have with your health care provider. Document Released: 01/23/2009 Document Revised: 09/04/2015 Document Reviewed: 05/03/2014 Elsevier Interactive Patient Education  2017 [employer].

## 2022-07-01 DIAGNOSIS — Z20822 Contact with and (suspected) exposure to covid-19: Secondary | ICD-10-CM | POA: Diagnosis not present

## 2022-07-01 DIAGNOSIS — R0981 Nasal congestion: Secondary | ICD-10-CM | POA: Diagnosis not present

## 2022-07-05 ENCOUNTER — Encounter: Payer: Medicare HMO | Admitting: Family Medicine

## 2022-07-12 ENCOUNTER — Ambulatory Visit (INDEPENDENT_AMBULATORY_CARE_PROVIDER_SITE_OTHER): Payer: Medicare HMO | Admitting: Family Medicine

## 2022-07-12 VITALS — BP 127/69 | HR 72 | Temp 98.2°F | Resp 72 | Ht 60.0 in | Wt 124.0 lb

## 2022-07-12 DIAGNOSIS — I10 Essential (primary) hypertension: Secondary | ICD-10-CM | POA: Diagnosis not present

## 2022-07-12 DIAGNOSIS — E7800 Pure hypercholesterolemia, unspecified: Secondary | ICD-10-CM | POA: Diagnosis not present

## 2022-07-12 DIAGNOSIS — Z1231 Encounter for screening mammogram for malignant neoplasm of breast: Secondary | ICD-10-CM | POA: Diagnosis not present

## 2022-07-12 DIAGNOSIS — Z0000 Encounter for general adult medical examination without abnormal findings: Secondary | ICD-10-CM

## 2022-07-12 DIAGNOSIS — K219 Gastro-esophageal reflux disease without esophagitis: Secondary | ICD-10-CM

## 2022-07-12 DIAGNOSIS — M810 Age-related osteoporosis without current pathological fracture: Secondary | ICD-10-CM

## 2022-07-12 DIAGNOSIS — M349 Systemic sclerosis, unspecified: Secondary | ICD-10-CM

## 2022-07-12 DIAGNOSIS — E559 Vitamin D deficiency, unspecified: Secondary | ICD-10-CM

## 2022-07-12 MED ORDER — LISINOPRIL 10 MG PO TABS: 5.0000 mg | ORAL_TABLET | Freq: Every day | ORAL | 0 refills | Status: DC

## 2022-07-12 NOTE — Patient Instructions (Signed)
Please review the attached list of medications and notify my office if there are any errors.   You can reduce lisinopril to 5mg  once a day by taking 1/2 of a 10mg  every day  Please call the [facility] [phone]) to schedule a routine screening mammogram.

## 2022-07-12 NOTE — Progress Notes (Signed)
Sara Huynh,acting as a scribe for Sara Huh, MD.,have documented all relevant documentation on the behalf of Sara Huh, MD,as directed by  Sara Huh, MD while in the presence of Sara Huh, MD.    Complete physical exam   Patient: Sara Huynh   DOB: 08-11-1950   72 y.o. Female  MRN: [id number] Visit Date: 07/12/2022  Today's healthcare provider: Lelon Huh, MD   No chief complaint on file.  Subjective    Sara Huynh is a 72 y.o. female who presents today for a complete physical exam. She feels well with no complaints. She has stopped taking vitamin d and is taking a multivitamin instead. She was tried on alendronate but it aggravated her underlying GI manifestations of Scleroderma. She feel escitalopram continues to be effective and and well tolerated.   Past Medical History:  Diagnosis Date   Allergy    Anxiety    Arthritis    GERD (gastroesophageal reflux disease)    Hyperlipidemia    Hypertension    Neuromuscular disorder ([place])    Osteoporosis    Shingles 12/18/2014   Ulcer    Past Surgical History:  Procedure Laterality Date   ABDOMINAL HYSTERECTOMY  1986   APPENDECTOMY     CHOLECYSTECTOMY  03/2009   ESOPHAGEAL DILATION     EYE SURGERY     G tube placed for 18 mths   2007   gi pacemaker  2002   NASAL SINUS SURGERY  2005   OOPHORECTOMY     PYLOROPLASTY  07/15/2015   Social History   Socioeconomic History   Marital status: Widowed    Spouse name: Not on file   Number of children: 3   Years of education: Not on file   Highest education level: Some college, no degree  Occupational History   Occupation: retired  Tobacco Use   Smoking status: Never   Smokeless tobacco: Never  Vaping Use   Vaping Use: Never used  Substance and Sexual Activity   Alcohol use: No   Drug use: No   Sexual activity: Not on file  Other Topics Concern   Not on file  Social History Narrative   Not on file   Social Determinants of Health    Financial Resource Strain: Low Risk  (06/29/2022)   Overall Financial Resource Strain (CARDIA)    Difficulty of Paying Living Expenses: Not hard at all  Food Insecurity: No Food Insecurity (06/29/2022)   Hunger Vital Sign    Worried About Running Out of Food in the Last Year: Never true    [place] in the Last Year: Never true  Transportation Needs: No Transportation Needs (06/29/2022)   PRAPARE - Hydrologist (Medical): No    Lack of Transportation (Non-Medical): No  Physical Activity: Insufficiently Active (06/29/2022)   Exercise Vital Sign    Days of Exercise per Week: 4 days    Minutes of Exercise per Session: 20 min  Stress: No Stress Concern Present (06/29/2022)   [place]    Feeling of Stress : Not at all  Social Connections: Unknown (06/29/2022)   Social Connection and Isolation Panel [NHANES]    Frequency of Communication with Friends and Family: More than three times a week    Frequency of Social Gatherings with Friends and Family: Three times a week    Attends Religious Services: Not on file    Active Member of Clubs  or Organizations: Yes    Attends Archivist Meetings: More than 4 times per year    Marital Status: Widowed  Intimate Partner Violence: Not At Risk (06/30/2022)   Humiliation, Afraid, Rape, and Kick questionnaire    Fear of Current or Ex-Partner: No    Emotionally Abused: No    Physically Abused: No    Sexually Abused: No   Family Status  Relation Name Status   Mother Patty Bowens Alive   Father Lavone Neri Deceased   Brother  Alive   Brother  Alive   Neg Hx  (Not Specified)   Family History  Problem Relation Age of Onset   CAD Mother    Heart disease Mother        MI   Dementia Mother    Alzheimer's disease Mother    CVA Father    COPD Father    Emphysema Father    Breast cancer Neg Hx    Allergies  Allergen Reactions    Alendronate     Worsening reflux with history of scleroderma   Dicyclomine Other (See Comments)    Double vision   Prednisone Itching, Other (See Comments) and Rash    Other reaction(s): Other (See Comments) Makes skin crawl and make pt aggitated Makes skin crawl and make pt aggitated Makes skin crawl and make pt aggitated     Patient Care Team: Birdie Sons, MD as PCP - General (Family Medicine) Leandrew Koyanagi, MD as Referring Physician (Ophthalmology) Scherrie November, MD as Referring Physician (Gastroenterology)   Medications: Outpatient Medications Prior to Visit  Medication Sig   acetaminophen (TYLENOL) 500 MG tablet Take 1,000 mg by mouth every 8 (eight) hours as needed.    amLODipine (NORVASC) 2.5 MG tablet TAKE 1 TABLET (2.5 MG TOTAL) BY MOUTH EVERY EVENING.   escitalopram (LEXAPRO) 10 MG tablet TAKE 1 TABLET EVERY DAY (NEED TO SCHEDULE OFFICE VISIT FOR FOLLOW UP)   lisinopril (ZESTRIL) 10 MG tablet TAKE 1 TABLET EVERY DAY (APPOINTMENT IS NEEDED)   omeprazole-sodium bicarbonate (ZEGERID) 40-1100 MG capsule Take 1 capsule by mouth 2 (two) times daily.    ondansetron (ZOFRAN) 8 MG tablet Take 1 tablet by mouth as needed.   polyethylene glycol (MIRALAX / GLYCOLAX) packet Take 17 g by mouth daily as needed.   simvastatin (ZOCOR) 20 MG tablet TAKE 1 TABLET AT BEDTIME (PLEASE SCHEDULE OFFICE VISIT FOR FURTHER REFILLS)   sucralfate (CARAFATE) 1 g tablet Take 1 g by mouth 4 (four) times daily.    Cholecalciferol (VITAMIN D3) 125 MCG (5000 UT) CAPS Take by mouth.   HYDROcodone-acetaminophen (NORCO/VICODIN) 5-325 MG tablet Take 1 tablet by mouth as needed. (Patient not taking: Reported on 07/12/2022)   traZODone (DESYREL) 50 MG tablet Take 1-2 tablets (50-100 mg total) by mouth at bedtime as needed for sleep.   No facility-administered medications prior to visit.   Patient states these are chronic and unchanged  Review of Systems  Constitutional: Negative.   HENT:  Positive  for rhinorrhea and sinus pressure.        Patient relates this to allergies   Eyes: Negative.   Respiratory: Negative.    Cardiovascular: Negative.   Gastrointestinal:  Positive for abdominal pain, nausea and vomiting.  Endocrine: Positive for cold intolerance and heat intolerance.  Genitourinary: Negative.   Musculoskeletal: Negative.   Skin:  Positive for color change.  Allergic/Immunologic: Positive for environmental allergies.  Neurological: Negative.   Hematological: Negative.   Psychiatric/Behavioral: Negative.  Objective    BP 127/69 (BP Location: Left Arm, Patient Position: Sitting, Cuff Size: Normal)   Pulse 72   Temp 98.2 F (36.8 C) (Oral)   Resp (!) 72   Ht 5' (1.524 m)   Wt 124 lb (56.2 kg)   BMI 24.22 kg/m    Physical Exam   General Appearance:    Well developed, well nourished female. Alert, cooperative, in no acute distress, appears stated age  Head:    Normocephalic, without obvious abnormality, atraumatic  Eyes:    PERRL, conjunctiva/corneas clear, EOM's intact, fundi    benign, both eyes       Ears:    Normal TM's and external ear canals, both ears  Nose:   Nares normal, septum midline, mucosa normal, no drainage   or sinus tenderness  Throat:   Lips, mucosa, and tongue normal; teeth and gums normal  Neck:   Supple, symmetrical, trachea midline, no adenopathy;       thyroid:  No enlargement/tenderness/nodules; no carotid   bruit or JVD  Back:     Symmetric, no curvature, ROM normal, no CVA tenderness  Lungs:     Clear to auscultation bilaterally, respirations unlabored  Chest wall:    No tenderness or deformity  Heart:    Normal heart rate. Normal rhythm. No murmurs, rubs, or gallops.  S1 and S2 normal  Abdomen:     Soft, non-tender, bowel sounds active all four quadrants,    no masses, no organomegaly  Genitalia:    deferred  Rectal:    deferred  Extremities:   All extremities are intact. No cyanosis or edema  Pulses:   2+ and symmetric  all extremities  Skin:   Skin color, texture, turgor normal, no rashes or lesions  Lymph nodes:   Cervical, supraclavicular, and axillary nodes normal  Neurologic:   CNII-XII intact. Normal strength, sensation and reflexes      throughout     Last depression screening scores    06/30/2022    9:38 AM 06/10/2021   11:04 AM 01/21/2021    1:30 PM  PHQ 2/9 Scores  PHQ - 2 Score 0 0 0   Last fall risk screening    06/29/2022   11:02 AM  [place] in the past year? 0  Number falls in past yr: 0  Injury with Fall? 0   Last Audit-C alcohol use screening    06/29/2022   11:02 AM  Alcohol Use Disorder Test (AUDIT)  1. How often do you have a drink containing alcohol? 0  3. How often do you have six or more drinks on one occasion? 0   A score of 3 or more in women, and 4 or more in men indicates increased risk for alcohol abuse, EXCEPT if all of the points are from question 1   No results found for any visits on 07/12/22.  Assessment & Plan    Routine Health Maintenance and Physical Exam  Exercise Activities and Dietary recommendations  Goals      DIET - EAT MORE FRUITS AND VEGETABLES     Increase water intake     Recommend increasing water intake to 4 glasses a day.        Immunization History  Administered Date(s) Administered   Fluad Quad(high Dose 65+) 01/01/2019, 01/23/2020, 01/13/2021, 01/22/2022   Influenza, High Dose Seasonal PF 01/03/2016, 01/08/2017, 01/28/2018   Influenza,inj,Quad PF,6+ Mos 01/11/2015   PFIZER Comirnaty(Gray Top)Covid-19 Tri-Sucrose Vaccine 05/23/2020  PFIZER(Purple Top)SARS-COV-2 Vaccination 05/25/2019, 06/19/2019   Pneumococcal Conjugate-13 01/03/2016   Pneumococcal Polysaccharide-23 01/08/2017   Tdap 10/05/2010   Zoster Recombinat (Shingrix) 05/19/2021, 07/21/2021    Health Maintenance  Topic Date Due   DEXA SCAN  11/03/2019   DTaP/Tdap/Td (2 - Td or Tdap) 10/04/2020   MAMMOGRAM  02/10/2021   COVID-19 Vaccine (4 - 2023-24  season) 12/11/2021   INFLUENZA VACCINE  11/11/2022   Medicare Annual Wellness (AWV)  06/30/2023   COLONOSCOPY (Pts 45-28yrs Insurance coverage will need to be confirmed)  07/11/2024   Pneumonia Vaccine 25+ Years old  Completed   Hepatitis C Screening  Completed   Zoster Vaccines- Shingrix  Completed   HPV VACCINES  Aged Out    Discussed health benefits of physical activity, and encouraged her to engage in regular exercise appropriate for her age and condition.   2. Essential hypertension Well controlled, but she does complain of feeling dizzy at times. Will reduce lisinopril to 5mg  daily by taking 1/2 of 10mg  tablet  - CBC with Differential/Platelet - Comprehensive metabolic panel - TSH  3. Gastroesophageal reflux disease, unspecified whether esophagitis present Continue current medications and routine GI follow up.   4. Hypercholesteremia She is tolerating simvastatin well with no adverse effects.   - Lipid Panel With LDL/HDL Ratio (fasting today)  5. Osteoporosis, unspecified osteoporosis type, unspecified pathological fracture presence Intolerant to alendronate due to esophagitis related to scleroderma.  - DG Bone Density [place]; Future  Counseled on parenteral treatments which she may be interested in   6. Avitaminosis D Currently taking MVI daily instead of vitamin D supplements.  - VITAMIN D 25 Hydroxy (Vit-D Deficiency, Fractures)  7. Scleroderma Continue regular follow up with GI at [place].   8. Encounter for screening mammogram for malignant neoplasm of breast  - MM 3D SCREENING MAMMOGRAM BILATERAL BREAST; Future     The entirety of the information documented in the History of Present Illness, Review of Systems and Physical Exam were personally obtained by me. Portions of this information were initially documented by the CMA and reviewed by me for thoroughness and accuracy.     Sara Huh, MD  [place] [phone]  (phone) [phone] (fax)  [place]

## 2022-07-13 LAB — CBC WITH DIFFERENTIAL/PLATELET
Basophils Absolute: 0.1 10*3/uL (ref 0.0–0.2)
Basos: 1 %
EOS (ABSOLUTE): 0.3 10*3/uL (ref 0.0–0.4)
Eos: 3 %
Hematocrit: 38 % (ref 34.0–46.6)
Hemoglobin: 12.4 g/dL (ref 11.1–15.9)
Immature Grans (Abs): 0 10*3/uL (ref 0.0–0.1)
Immature Granulocytes: 1 %
Lymphocytes Absolute: 2.5 10*3/uL (ref 0.7–3.1)
Lymphs: 29 %
MCH: 28.2 pg (ref 26.6–33.0)
MCHC: 32.6 g/dL (ref 31.5–35.7)
MCV: 86 fL (ref 79–97)
Monocytes Absolute: 1 10*3/uL — ABNORMAL HIGH (ref 0.1–0.9)
Monocytes: 12 %
Neutrophils Absolute: 4.7 10*3/uL (ref 1.4–7.0)
Neutrophils: 54 %
Platelets: 283 10*3/uL (ref 150–450)
RBC: 4.4 x10E6/uL (ref 3.77–5.28)
RDW: 14.8 % (ref 11.7–15.4)
WBC: 8.6 10*3/uL (ref 3.4–10.8)

## 2022-07-13 LAB — VITAMIN D 25 HYDROXY (VIT D DEFICIENCY, FRACTURES): Vit D, 25-Hydroxy: 30.1 ng/mL (ref 30.0–100.0)

## 2022-07-13 LAB — COMPREHENSIVE METABOLIC PANEL
ALT: 17 IU/L (ref 0–32)
AST: 22 IU/L (ref 0–40)
Albumin/Globulin Ratio: 1.7 (ref 1.2–2.2)
Albumin: 4.4 g/dL (ref 3.8–4.8)
Alkaline Phosphatase: 72 IU/L (ref 44–121)
BUN/Creatinine Ratio: 10 — ABNORMAL LOW (ref 12–28)
BUN: 11 mg/dL (ref 8–27)
Bilirubin Total: 0.6 mg/dL (ref 0.0–1.2)
CO2: 24 mmol/L (ref 20–29)
Calcium: 10 mg/dL (ref 8.7–10.3)
Chloride: 107 mmol/L — ABNORMAL HIGH (ref 96–106)
Creatinine, Ser: 1.08 mg/dL — ABNORMAL HIGH (ref 0.57–1.00)
Globulin, Total: 2.6 g/dL (ref 1.5–4.5)
Glucose: 99 mg/dL (ref 70–99)
Potassium: 5.1 mmol/L (ref 3.5–5.2)
Sodium: 149 mmol/L — ABNORMAL HIGH (ref 134–144)
Total Protein: 7 g/dL (ref 6.0–8.5)
eGFR: 55 mL/min/{1.73_m2} — ABNORMAL LOW (ref >59)

## 2022-07-13 LAB — LIPID PANEL WITH LDL/HDL RATIO
Cholesterol, Total: 221 mg/dL — ABNORMAL HIGH (ref 100–199)
HDL: 53 mg/dL (ref >39)
LDL Chol Calc (NIH): 121 mg/dL — ABNORMAL HIGH (ref 0–99)
LDL/HDL Ratio: 2.3 ratio (ref 0.0–3.2)
Triglycerides: 267 mg/dL — ABNORMAL HIGH (ref 0–149)
VLDL Cholesterol Cal: 47 mg/dL — ABNORMAL HIGH (ref 5–40)

## 2022-07-13 LAB — TSH: TSH: 5.8 u[IU]/mL — ABNORMAL HIGH (ref 0.450–4.500)

## 2022-08-31 ENCOUNTER — Ambulatory Visit
Admission: RE | Admit: 2022-08-31 | Discharge: 2022-08-31 | Disposition: A | Discharge status: Home / Self Care | Payer: Medicare HMO | Source: Ambulatory Visit | Attending: Family Medicine | Admitting: Family Medicine

## 2022-08-31 DIAGNOSIS — Z1231 Encounter for screening mammogram for malignant neoplasm of breast: Secondary | ICD-10-CM | POA: Insufficient documentation

## 2022-08-31 DIAGNOSIS — M810 Age-related osteoporosis without current pathological fracture: Secondary | ICD-10-CM | POA: Diagnosis not present

## 2022-09-03 ENCOUNTER — Other Ambulatory Visit: Payer: Self-pay | Admitting: Family Medicine

## 2022-09-03 DIAGNOSIS — M810 Age-related osteoporosis without current pathological fracture: Secondary | ICD-10-CM

## 2022-09-09 ENCOUNTER — Other Ambulatory Visit: Payer: Self-pay | Admitting: Family Medicine

## 2022-09-09 DIAGNOSIS — F439 Reaction to severe stress, unspecified: Secondary | ICD-10-CM

## 2022-09-09 DIAGNOSIS — I10 Essential (primary) hypertension: Secondary | ICD-10-CM

## 2022-10-19 DIAGNOSIS — H2513 Age-related nuclear cataract, bilateral: Secondary | ICD-10-CM | POA: Diagnosis not present

## 2022-10-19 DIAGNOSIS — H5000 Unspecified esotropia: Secondary | ICD-10-CM | POA: Diagnosis not present

## 2022-10-19 DIAGNOSIS — H532 Diplopia: Secondary | ICD-10-CM | POA: Diagnosis not present

## 2022-11-17 DIAGNOSIS — H6123 Impacted cerumen, bilateral: Secondary | ICD-10-CM | POA: Diagnosis not present

## 2022-11-17 DIAGNOSIS — H903 Sensorineural hearing loss, bilateral: Secondary | ICD-10-CM | POA: Diagnosis not present

## 2022-11-21 ENCOUNTER — Other Ambulatory Visit: Payer: Self-pay | Admitting: Family Medicine

## 2022-11-21 DIAGNOSIS — E7800 Pure hypercholesterolemia, unspecified: Secondary | ICD-10-CM

## 2022-11-23 NOTE — Telephone Encounter (Signed)
Requested Prescriptions  Pending Prescriptions Disp Refills   simvastatin (ZOCOR) 20 MG tablet [Pharmacy Med Name: SIMVASTATIN 20 MG Tablet] 90 tablet 2    Sig: TAKE 1 TABLET AT BEDTIME (PLEASE SCHEDULE OFFICE VISIT FOR FURTHER REFILLS)     Cardiovascular:  Antilipid - Statins Failed - 11/21/2022  2:47 AM      Failed - Lipid Panel in normal range within the last 12 months    Cholesterol, Total  Date Value Ref Range Status  07/12/2022 221 (H) 100 - 199 mg/dL Final   LDL Chol Calc (NIH)  Date Value Ref Range Status  07/12/2022 121 (H) 0 - 99 mg/dL Final   HDL  Date Value Ref Range Status  07/12/2022 53 >39 mg/dL Final   Triglycerides  Date Value Ref Range Status  07/12/2022 267 (H) 0 - 149 mg/dL Final         Passed - Patient is not pregnant      Passed - Valid encounter within last 12 months    Recent Outpatient Visits           4 months ago Annual physical exam   [facility] Malva Limes, MD   1 year ago Dizziness   [facility] Malva Limes, MD   1 year ago Essential hypertension    [facility] Malva Limes, MD   2 years ago Avitaminosis D   [facility] Malva Limes, MD   2 years ago Sinus congestion   [facility] [place], [place]ell [place], [place]

## 2023-01-15 ENCOUNTER — Other Ambulatory Visit: Payer: Self-pay

## 2023-01-15 ENCOUNTER — Emergency Department: Payer: Medicare HMO

## 2023-01-15 DIAGNOSIS — Z888 Allergy status to other drugs, medicaments and biological substances status: Secondary | ICD-10-CM

## 2023-01-15 DIAGNOSIS — I2510 Atherosclerotic heart disease of native coronary artery without angina pectoris: Secondary | ICD-10-CM | POA: Diagnosis present

## 2023-01-15 DIAGNOSIS — E785 Hyperlipidemia, unspecified: Secondary | ICD-10-CM | POA: Diagnosis present

## 2023-01-15 DIAGNOSIS — R079 Chest pain, unspecified: Secondary | ICD-10-CM | POA: Diagnosis not present

## 2023-01-15 DIAGNOSIS — Z8249 Family history of ischemic heart disease and other diseases of the circulatory system: Secondary | ICD-10-CM | POA: Diagnosis not present

## 2023-01-15 DIAGNOSIS — Z823 Family history of stroke: Secondary | ICD-10-CM | POA: Diagnosis not present

## 2023-01-15 DIAGNOSIS — Z950 Presence of cardiac pacemaker: Secondary | ICD-10-CM | POA: Diagnosis not present

## 2023-01-15 DIAGNOSIS — I161 Hypertensive emergency: Secondary | ICD-10-CM | POA: Diagnosis present

## 2023-01-15 DIAGNOSIS — I214 Non-ST elevation (NSTEMI) myocardial infarction: Principal | ICD-10-CM | POA: Diagnosis present

## 2023-01-15 DIAGNOSIS — Z7982 Long term (current) use of aspirin: Secondary | ICD-10-CM | POA: Diagnosis not present

## 2023-01-15 DIAGNOSIS — K219 Gastro-esophageal reflux disease without esophagitis: Secondary | ICD-10-CM | POA: Diagnosis present

## 2023-01-15 DIAGNOSIS — F419 Anxiety disorder, unspecified: Secondary | ICD-10-CM | POA: Diagnosis present

## 2023-01-15 DIAGNOSIS — F32A Depression, unspecified: Secondary | ICD-10-CM | POA: Diagnosis present

## 2023-01-15 DIAGNOSIS — I2111 ST elevation (STEMI) myocardial infarction involving right coronary artery: Secondary | ICD-10-CM | POA: Diagnosis not present

## 2023-01-15 DIAGNOSIS — I10 Essential (primary) hypertension: Secondary | ICD-10-CM | POA: Diagnosis present

## 2023-01-15 DIAGNOSIS — Z825 Family history of asthma and other chronic lower respiratory diseases: Secondary | ICD-10-CM | POA: Diagnosis not present

## 2023-01-15 DIAGNOSIS — Z79899 Other long term (current) drug therapy: Secondary | ICD-10-CM | POA: Diagnosis not present

## 2023-01-15 DIAGNOSIS — Z820 Family history of epilepsy and other diseases of the nervous system: Secondary | ICD-10-CM

## 2023-01-15 DIAGNOSIS — K5730 Diverticulosis of large intestine without perforation or abscess without bleeding: Secondary | ICD-10-CM | POA: Diagnosis not present

## 2023-01-15 DIAGNOSIS — M349 Systemic sclerosis, unspecified: Secondary | ICD-10-CM | POA: Diagnosis present

## 2023-01-15 DIAGNOSIS — R42 Dizziness and giddiness: Secondary | ICD-10-CM | POA: Diagnosis not present

## 2023-01-15 DIAGNOSIS — Z955 Presence of coronary angioplasty implant and graft: Secondary | ICD-10-CM | POA: Diagnosis not present

## 2023-01-15 DIAGNOSIS — R0789 Other chest pain: Secondary | ICD-10-CM | POA: Diagnosis not present

## 2023-01-15 DIAGNOSIS — M549 Dorsalgia, unspecified: Secondary | ICD-10-CM | POA: Diagnosis not present

## 2023-01-15 DIAGNOSIS — I241 Dressler's syndrome: Secondary | ICD-10-CM | POA: Diagnosis not present

## 2023-01-15 LAB — CBC
HCT: 38.4 % (ref 36.0–46.0)
Hemoglobin: 12.7 g/dL (ref 12.0–15.0)
MCH: 28.7 pg (ref 26.0–34.0)
MCHC: 33.1 g/dL (ref 30.0–36.0)
MCV: 86.7 fL (ref 80.0–100.0)
Platelets: 247 10*3/uL (ref 150–400)
RBC: 4.43 MIL/uL (ref 3.87–5.11)
RDW: 14.6 % (ref 11.5–15.5)
WBC: 11.9 10*3/uL — ABNORMAL HIGH (ref 4.0–10.5)
nRBC: 0 % (ref 0.0–0.2)

## 2023-01-15 LAB — HEPATIC FUNCTION PANEL
ALT: 20 U/L (ref 0–44)
AST: 26 U/L (ref 15–41)
Albumin: 4.3 g/dL (ref 3.5–5.0)
Alkaline Phosphatase: 58 U/L (ref 38–126)
Bilirubin, Direct: 0.2 mg/dL (ref 0.0–0.2)
Indirect Bilirubin: 0.8 mg/dL (ref 0.3–0.9)
Total Bilirubin: 1 mg/dL (ref 0.3–1.2)
Total Protein: 7.7 g/dL (ref 6.5–8.1)

## 2023-01-15 LAB — BASIC METABOLIC PANEL
Anion gap: 10 (ref 5–15)
BUN: 11 mg/dL (ref 8–23)
CO2: 25 mmol/L (ref 22–32)
Calcium: 9.3 mg/dL (ref 8.9–10.3)
Chloride: 102 mmol/L (ref 98–111)
Creatinine, Ser: 1.11 mg/dL — ABNORMAL HIGH (ref 0.44–1.00)
GFR, Estimated: 53 mL/min — ABNORMAL LOW (ref >60)
Glucose, Bld: 91 mg/dL (ref 70–99)
Potassium: 3.8 mmol/L (ref 3.5–5.1)
Sodium: 137 mmol/L (ref 135–145)

## 2023-01-15 LAB — TROPONIN I (HIGH SENSITIVITY): Troponin I (High Sensitivity): 6 ng/L (ref <18)

## 2023-01-15 LAB — LIPASE, BLOOD: Lipase: 47 U/L (ref 11–51)

## 2023-01-15 NOTE — ED Triage Notes (Signed)
Pt c/o feeling tired for about a week, pt report upper back and chest pain mild SHOB and dizziness that got worse today. Pt AOX4, NAD noted.

## 2023-01-16 ENCOUNTER — Emergency Department: Payer: Medicare HMO

## 2023-01-16 ENCOUNTER — Encounter: Payer: Self-pay | Admitting: Internal Medicine

## 2023-01-16 ENCOUNTER — Other Ambulatory Visit: Payer: Self-pay

## 2023-01-16 ENCOUNTER — Inpatient Hospital Stay
Admission: EM | Admit: 2023-01-16 | Discharge: 2023-01-18 | DRG: 322 | Disposition: A | Discharge status: Home / Self Care | Payer: Medicare HMO | Attending: Internal Medicine | Admitting: Internal Medicine

## 2023-01-16 DIAGNOSIS — M349 Systemic sclerosis, unspecified: Secondary | ICD-10-CM | POA: Diagnosis present

## 2023-01-16 DIAGNOSIS — I214 Non-ST elevation (NSTEMI) myocardial infarction: Principal | ICD-10-CM

## 2023-01-16 DIAGNOSIS — Z7982 Long term (current) use of aspirin: Secondary | ICD-10-CM | POA: Diagnosis not present

## 2023-01-16 DIAGNOSIS — Z950 Presence of cardiac pacemaker: Secondary | ICD-10-CM | POA: Diagnosis not present

## 2023-01-16 DIAGNOSIS — Z8249 Family history of ischemic heart disease and other diseases of the circulatory system: Secondary | ICD-10-CM | POA: Diagnosis not present

## 2023-01-16 DIAGNOSIS — E785 Hyperlipidemia, unspecified: Secondary | ICD-10-CM | POA: Diagnosis present

## 2023-01-16 DIAGNOSIS — Z888 Allergy status to other drugs, medicaments and biological substances status: Secondary | ICD-10-CM | POA: Diagnosis not present

## 2023-01-16 DIAGNOSIS — Z820 Family history of epilepsy and other diseases of the nervous system: Secondary | ICD-10-CM | POA: Diagnosis not present

## 2023-01-16 DIAGNOSIS — I161 Hypertensive emergency: Secondary | ICD-10-CM | POA: Diagnosis present

## 2023-01-16 DIAGNOSIS — Z823 Family history of stroke: Secondary | ICD-10-CM | POA: Diagnosis not present

## 2023-01-16 DIAGNOSIS — I2510 Atherosclerotic heart disease of native coronary artery without angina pectoris: Secondary | ICD-10-CM | POA: Diagnosis present

## 2023-01-16 DIAGNOSIS — I241 Dressler's syndrome: Secondary | ICD-10-CM | POA: Diagnosis not present

## 2023-01-16 DIAGNOSIS — F32A Depression, unspecified: Secondary | ICD-10-CM | POA: Diagnosis present

## 2023-01-16 DIAGNOSIS — F419 Anxiety disorder, unspecified: Secondary | ICD-10-CM | POA: Diagnosis present

## 2023-01-16 DIAGNOSIS — Z79899 Other long term (current) drug therapy: Secondary | ICD-10-CM | POA: Diagnosis not present

## 2023-01-16 DIAGNOSIS — I10 Essential (primary) hypertension: Secondary | ICD-10-CM | POA: Diagnosis present

## 2023-01-16 DIAGNOSIS — K219 Gastro-esophageal reflux disease without esophagitis: Secondary | ICD-10-CM | POA: Diagnosis present

## 2023-01-16 DIAGNOSIS — Z825 Family history of asthma and other chronic lower respiratory diseases: Secondary | ICD-10-CM | POA: Diagnosis not present

## 2023-01-16 HISTORY — DX: Non-ST elevation (NSTEMI) myocardial infarction: I21.4

## 2023-01-16 LAB — PROTIME-INR
INR: 1 (ref 0.8–1.2)
Prothrombin Time: 13 s (ref 11.4–15.2)

## 2023-01-16 LAB — TROPONIN I (HIGH SENSITIVITY)
Troponin I (High Sensitivity): 214 ng/L (ref <18)
Troponin I (High Sensitivity): 88 ng/L — ABNORMAL HIGH (ref <18)

## 2023-01-16 LAB — HEPARIN LEVEL (UNFRACTIONATED)
Heparin Unfractionated: 0.33 [IU]/mL (ref 0.30–0.70)
Heparin Unfractionated: 0.18 [IU]/mL — ABNORMAL LOW (ref 0.30–0.70)

## 2023-01-16 LAB — APTT: aPTT: 28 s (ref 24–36)

## 2023-01-16 MED ORDER — SUCRALFATE 1 G PO TABS
1.0000 g | ORAL_TABLET | Freq: Four times a day (QID) | ORAL | Status: DC
  Administered 2023-01-16 – 2023-01-18 (×7): 1 g via ORAL
  Filled 2023-01-16 (×7): qty 1

## 2023-01-16 MED ORDER — NITROGLYCERIN 2 % TD OINT
0.5000 [in_us] | TOPICAL_OINTMENT | Freq: Four times a day (QID) | TRANSDERMAL | Status: AC
  Administered 2023-01-16 (×3): 0.5 [in_us] via TOPICAL
  Filled 2023-01-16 (×3): qty 1

## 2023-01-16 MED ORDER — ASPIRIN 81 MG PO TBEC
81.0000 mg | DELAYED_RELEASE_TABLET | Freq: Every day | ORAL | Status: DC
  Administered 2023-01-17: 81 mg via ORAL
  Filled 2023-01-16: qty 1

## 2023-01-16 MED ORDER — ALUM & MAG HYDROXIDE-SIMETH 200-200-20 MG/5ML PO SUSP
30.0000 mL | Freq: Once | ORAL | Status: AC
  Administered 2023-01-16: 30 mL via ORAL
  Filled 2023-01-16: qty 30

## 2023-01-16 MED ORDER — ONDANSETRON HCL 4 MG/2ML IJ SOLN
4.0000 mg | Freq: Once | INTRAMUSCULAR | Status: AC
  Administered 2023-01-16: 4 mg via INTRAVENOUS
  Filled 2023-01-16: qty 2

## 2023-01-16 MED ORDER — HEPARIN (PORCINE) 25000 UT/250ML-% IV SOLN
INTRAVENOUS | Status: AC
  Administered 2023-01-16: 3500 [IU] via INTRAVENOUS
  Filled 2023-01-16: qty 250

## 2023-01-16 MED ORDER — IOHEXOL 350 MG/ML SOLN
100.0000 mL | Freq: Once | INTRAVENOUS | Status: AC | PRN
  Administered 2023-01-16: 100 mL via INTRAVENOUS

## 2023-01-16 MED ORDER — NITROGLYCERIN 0.4 MG SL SUBL
SUBLINGUAL_TABLET | SUBLINGUAL | Status: AC
  Administered 2023-01-16: 0.4 mg via SUBLINGUAL
  Filled 2023-01-16: qty 1

## 2023-01-16 MED ORDER — ESCITALOPRAM OXALATE 10 MG PO TABS
10.0000 mg | ORAL_TABLET | Freq: Every day | ORAL | Status: DC
  Administered 2023-01-16 – 2023-01-18 (×3): 10 mg via ORAL
  Filled 2023-01-16 (×3): qty 1

## 2023-01-16 MED ORDER — MORPHINE SULFATE (PF) 4 MG/ML IV SOLN
4.0000 mg | Freq: Once | INTRAVENOUS | Status: AC
  Administered 2023-01-16: 4 mg via INTRAVENOUS
  Filled 2023-01-16: qty 1

## 2023-01-16 MED ORDER — HEPARIN (PORCINE) 25000 UT/250ML-% IV SOLN
900.0000 [IU]/h | INTRAVENOUS | Status: DC
  Administered 2023-01-16: 750 [IU]/h via INTRAVENOUS
  Administered 2023-01-17: 900 [IU]/h via INTRAVENOUS
  Filled 2023-01-16: qty 250

## 2023-01-16 MED ORDER — AMLODIPINE BESYLATE 5 MG PO TABS
2.5000 mg | ORAL_TABLET | Freq: Every evening | ORAL | Status: DC
  Administered 2023-01-16: 2.5 mg via ORAL
  Filled 2023-01-16: qty 1

## 2023-01-16 MED ORDER — LIDOCAINE 5 % EX PTCH
1.0000 | MEDICATED_PATCH | CUTANEOUS | Status: DC
  Administered 2023-01-16: 1 via TRANSDERMAL
  Filled 2023-01-16 (×3): qty 1

## 2023-01-16 MED ORDER — NITROGLYCERIN 0.4 MG SL SUBL: 0.4000 mg | SUBLINGUAL_TABLET | SUBLINGUAL | Status: DC | PRN

## 2023-01-16 MED ORDER — ASPIRIN 81 MG PO CHEW: 324.0000 mg | CHEWABLE_TABLET | ORAL | Status: AC

## 2023-01-16 MED ORDER — PANTOPRAZOLE SODIUM 40 MG IV SOLR
40.0000 mg | Freq: Once | INTRAVENOUS | Status: AC
  Administered 2023-01-16: 40 mg via INTRAVENOUS
  Filled 2023-01-16: qty 10

## 2023-01-16 MED ORDER — LISINOPRIL 10 MG PO TABS
10.0000 mg | ORAL_TABLET | Freq: Every day | ORAL | Status: DC
  Administered 2023-01-16 – 2023-01-18 (×3): 10 mg via ORAL
  Filled 2023-01-16 (×3): qty 1

## 2023-01-16 MED ORDER — ASPIRIN 81 MG PO CHEW
243.0000 mg | CHEWABLE_TABLET | Freq: Once | ORAL | Status: AC
  Administered 2023-01-16: 243 mg via ORAL
  Filled 2023-01-16: qty 3

## 2023-01-16 MED ORDER — PANTOPRAZOLE SODIUM 40 MG PO TBEC
40.0000 mg | DELAYED_RELEASE_TABLET | Freq: Every day | ORAL | Status: DC
  Administered 2023-01-16 – 2023-01-18 (×3): 40 mg via ORAL
  Filled 2023-01-16 (×3): qty 1

## 2023-01-16 MED ORDER — POLYETHYLENE GLYCOL 3350 17 G PO PACK: 17.0000 g | PACK | Freq: Every day | ORAL | Status: DC | PRN

## 2023-01-16 MED ORDER — SODIUM CHLORIDE 0.9% FLUSH
3.0000 mL | Freq: Two times a day (BID) | INTRAVENOUS | Status: DC
  Administered 2023-01-16 – 2023-01-18 (×3): 3 mL via INTRAVENOUS

## 2023-01-16 MED ORDER — HEPARIN BOLUS VIA INFUSION
3500.0000 [IU] | Freq: Once | INTRAVENOUS | Status: AC
  Filled 2023-01-16: qty 3500

## 2023-01-16 MED ORDER — ATORVASTATIN CALCIUM 20 MG PO TABS
40.0000 mg | ORAL_TABLET | Freq: Every day | ORAL | Status: DC
  Administered 2023-01-16 – 2023-01-18 (×3): 40 mg via ORAL
  Filled 2023-01-16 (×3): qty 2

## 2023-01-16 MED ORDER — ONDANSETRON HCL 4 MG/2ML IJ SOLN: 4.0000 mg | Freq: Four times a day (QID) | INTRAMUSCULAR | Status: DC | PRN

## 2023-01-16 MED ORDER — LIDOCAINE VISCOUS HCL 2 % MT SOLN
15.0000 mL | Freq: Once | OROMUCOSAL | Status: AC
  Administered 2023-01-16: 15 mL via OROMUCOSAL
  Filled 2023-01-16: qty 15

## 2023-01-16 MED ORDER — ASPIRIN 300 MG RE SUPP
300.0000 mg | RECTAL | Status: AC
  Filled 2023-01-16: qty 1

## 2023-01-16 MED ORDER — ACETAMINOPHEN 500 MG PO TABS
1000.0000 mg | ORAL_TABLET | Freq: Three times a day (TID) | ORAL | Status: DC | PRN
  Administered 2023-01-16 (×2): 1000 mg via ORAL
  Filled 2023-01-16 (×2): qty 2

## 2023-01-16 MED ORDER — NITROGLYCERIN 0.4 MG SL SUBL
0.4000 mg | SUBLINGUAL_TABLET | SUBLINGUAL | Status: DC | PRN
  Administered 2023-01-16 (×2): 0.4 mg via SUBLINGUAL
  Filled 2023-01-16 (×2): qty 1

## 2023-01-16 MED ORDER — HEPARIN BOLUS VIA INFUSION
1700.0000 [IU] | Freq: Once | INTRAVENOUS | Status: AC
  Administered 2023-01-16: 1700 [IU] via INTRAVENOUS
  Filled 2023-01-16: qty 1700

## 2023-01-16 NOTE — Plan of Care (Signed)

## 2023-01-16 NOTE — Progress Notes (Signed)
ANTICOAGULATION CONSULT NOTE  Pharmacy Consult for heparin infusion Indication: ACS/STEMI  Allergies  Allergen Reactions   Alendronate     Worsening reflux with history of scleroderma   Dicyclomine Other (See Comments)    Double vision   Prednisone Itching, Other (See Comments) and Rash    Other reaction(s): Other (See Comments) Makes skin crawl and make pt aggitated Makes skin crawl and make pt aggitated Makes skin crawl and make pt aggitated     Patient Measurements: Height: 5' (152.4 cm) Weight: 59.3 kg (130 lb 12.8 oz) IBW/kg (Calculated) : 45.5 Heparin Dosing Weight: 57.6 kg  Vital Signs: Temp: 98.3 F (36.8 C) (10/06 1959) Temp Source: Oral (10/06 1536) BP: 123/66 (10/06 1959) Pulse Rate: 66 (10/06 1959)  Labs: Recent Labs    01/15/23 2228 01/16/23 0304 01/16/23 0441 01/16/23 1039 01/16/23 1404 01/16/23 2145  HGB 12.7  --   --   --   --   --   HCT 38.4  --   --   --   --   --   PLT 247  --   --   --   --   --   APTT  --   --  28  --   --   --   LABPROT  --   --  13.0  --   --   --   INR  --   --  1.0  --   --   --   HEPARINUNFRC  --   --   --   --  0.33 0.18*  CREATININE 1.11*  --   --   --   --   --   TROPONINIHS 6 214*  --  88*  --   --     Estimated Creatinine Clearance: 36.9 mL/min (A) (by C-G formula based on SCr of 1.11 mg/dL (H)).   Medical History: Past Medical History:  Diagnosis Date   Allergy    Anxiety    Arthritis    GERD (gastroesophageal reflux disease)    Hyperlipidemia    Hypertension    Neuromuscular disorder (HCC)    NSTEMI (non-ST elevated myocardial infarction) (HCC) 01/16/2023   Osteoporosis    Shingles 12/18/2014   Ulcer     Assessment: Pt is a 72 yo female presenting to ED upper back & chest pain, SOB, & dizziness found with troponin I level, trending up.  10/6 1404  HL 0.33,  Therapeutic x1 10/6 2145 HL 0.18, subtherapeutic  Goal of Therapy:  Heparin level 0.3-0.7 units/ml Monitor platelets by anticoagulation  protocol: Yes   Plan:  Bolus 1700 units x 1 Increase heparin infusion to 900 units/hr Will recheck HL w/ AM labs after rate change CBC daily while on heparin  Otelia Sergeant, PharmD, [facility] 01/16/2023 10:41 PM

## 2023-01-16 NOTE — H&P (Addendum)
History and Physical    CRYSTINA BORRAYO WUJ:[id number] DOB: 1950-05-14 DOA: 01/16/2023  PCP: Malva Limes, MD (Confirm with patient/family/NH records and if not entered, this has to be entered at [facility] point of entry) Patient coming from: Home  I have personally briefly reviewed patient's old medical records in [facility]  Chief Complaint: Chestpain   HPI: Sara Huynh is a 72 y.o. female with medical history significant of HTN, HLD, anxiety/depression, NSTEMI with remote cardiac cath but no stenting, anxiety/depression and GERD, presented with new onset of chest pain and back pain.  Patient reports that yesterday daytime she did quite some yard work and felt really exhausted at the end of the day.  At daytime family checked her blood pressure and found systolic more than 200.  In the evening while watching TV she started to feel severe back pain sharp-like, then she started to feel pressure-like chest pain 6-7/10 associated with shortness of breath and family called EMS.  She described the chest pain as pressure-like, nonradiating and constant.  ED Course: Blood pressure elevated 222/100, none bradycardia or tachycardia.  Chest x-ray no acute infiltrates.  On patient was shifted to CT 8 dissection study.  En route coming back to ED, patient started to feel 10/10 pressure-like chest pain and stat EKG showed active ST changes of ST depression and T wave inversion in lead II and III and V4-V6.  Blood work showed uptrending troponins 6> 214.  Patient was started on heparin drip in the ED. chest pain subsided after given nitroglycerin x 3.  Review of Systems: As per HPI otherwise 14 point review of systems negative.    Past Medical History:  Diagnosis Date   Allergy    Anxiety    Arthritis    GERD (gastroesophageal reflux disease)    Hyperlipidemia    Hypertension    Neuromuscular disorder (HCC)    NSTEMI (non-ST elevated myocardial infarction) (HCC) 01/16/2023   Osteoporosis     Shingles 12/18/2014   Ulcer     Past Surgical History:  Procedure Laterality Date   ABDOMINAL HYSTERECTOMY  1986   APPENDECTOMY     CHOLECYSTECTOMY  03/2009   ESOPHAGEAL DILATION     EYE SURGERY     G tube placed for 18 mths   2007   gi pacemaker  2002   NASAL SINUS SURGERY  2005   OOPHORECTOMY     PYLOROPLASTY  07/15/2015     reports that she has never smoked. She has never used smokeless tobacco. She reports that she does not drink alcohol and does not use drugs.  Allergies  Allergen Reactions   Alendronate     Worsening reflux with history of scleroderma   Dicyclomine Other (See Comments)    Double vision   Prednisone Itching, Other (See Comments) and Rash    Other reaction(s): Other (See Comments) Makes skin crawl and make pt aggitated Makes skin crawl and make pt aggitated Makes skin crawl and make pt aggitated     Family History  Problem Relation Age of Onset   CAD Mother    Heart disease Mother        MI   Dementia Mother    Alzheimer's disease Mother    CVA Father    COPD Father    Emphysema Father    Breast cancer Neg Hx      Prior to Admission medications   Medication Sig Start Date End Date Taking? Authorizing Provider  acetaminophen (TYLENOL)  500 MG tablet Take 1,000 mg by mouth every 8 (eight) hours as needed.     [provider]  amLODipine (NORVASC) 2.5 MG tablet TAKE 1 TABLET EVERY EVENING 09/09/22   Malva Limes, MD  Cholecalciferol (VITAMIN D3) 125 MCG (5000 UT) CAPS Take by mouth.    [provider]  escitalopram (LEXAPRO) 10 MG tablet Take 1 tablet (10 mg total) by mouth daily. 09/09/22   Malva Limes, MD  lisinopril (ZESTRIL) 10 MG tablet Take 1 tablet (10 mg total) by mouth daily. 09/09/22   Malva Limes, MD  omeprazole-sodium bicarbonate (ZEGERID) 40-1100 MG capsule Take 1 capsule by mouth 2 (two) times daily.     [provider]  ondansetron (ZOFRAN) 8 MG tablet Take 1 tablet by mouth as needed.     [provider]  polyethylene glycol (MIRALAX / GLYCOLAX) packet Take 17 g by mouth daily as needed.    [provider]  simvastatin (ZOCOR) 20 MG tablet TAKE 1 TABLET AT BEDTIME (PLEASE SCHEDULE OFFICE VISIT FOR FURTHER REFILLS) 11/23/22   Malva Limes, MD  sucralfate (CARAFATE) 1 g tablet Take 1 g by mouth 4 (four) times daily.     [provider]    Physical Exam: Vitals:   01/16/23 0832 01/16/23 0900 01/16/23 0911 01/16/23 0930  BP:  135/71 125/69 138/73  Pulse:  (!) 56  (!) 57  Resp:  12  15  Temp:      SpO2: 100% 99%  99%  Weight:      Height:        Constitutional: NAD, calm, comfortable Vitals:   01/16/23 0832 01/16/23 0900 01/16/23 0911 01/16/23 0930  BP:  135/71 125/69 138/73  Pulse:  (!) 56  (!) 57  Resp:  12  15  Temp:      SpO2: 100% 99%  99%  Weight:      Height:       Eyes: PERRL, lids and conjunctivae normal ENMT: Mucous membranes are moist. Posterior pharynx clear of any exudate or lesions.Normal dentition.  Neck: normal, supple, no masses, no thyromegaly Respiratory: clear to auscultation bilaterally, no wheezing, no crackles. Normal respiratory effort. No accessory muscle use.  Cardiovascular: Regular rate and rhythm, no murmurs / rubs / gallops. No extremity edema. 2+ pedal pulses. No carotid bruits.  Abdomen: no tenderness, no masses palpated. No hepatosplenomegaly. Bowel sounds positive.  Musculoskeletal: no clubbing / cyanosis. No joint deformity upper and lower extremities. Good ROM, no contractures. Normal muscle tone.  Skin: no rashes, lesions, ulcers. No induration Neurologic: CN 2-12 grossly intact. Sensation intact, DTR normal. Strength 5/5 in all 4.  Psychiatric: Normal judgment and insight. Alert and oriented x 3. Normal mood.     Labs on Admission: I have personally reviewed following labs and imaging studies  CBC: Recent Labs  Lab 01/15/23 2228  WBC 11.9*  HGB 12.7  HCT 38.4  MCV 86.7  PLT 247   Basic  Metabolic Panel: Recent Labs  Lab 01/15/23 2228  NA 137  K 3.8  CL 102  CO2 25  GLUCOSE 91  BUN 11  CREATININE 1.11*  CALCIUM 9.3   GFR: Estimated Creatinine Clearance: 36.9 mL/min (A) (by C-G formula based on SCr of 1.11 mg/dL (H)). Liver Function Tests: Recent Labs  Lab 01/15/23 2228  AST 26  ALT 20  ALKPHOS 58  BILITOT 1.0  PROT 7.7  ALBUMIN 4.3   Recent Labs  Lab 01/15/23 2228  LIPASE 47  No results for input(s): "AMMONIA" in the last 168 hours. Coagulation Profile: Recent Labs  Lab 01/16/23 0441  INR 1.0   Cardiac Enzymes: No results for input(s): "CKTOTAL", "CKMB", "CKMBINDEX", "TROPONINI" in the last 168 hours. BNP (last 3 results) No results for input(s): "PROBNP" in the last 8760 hours. HbA1C: No results for input(s): "HGBA1C" in the last 72 hours. CBG: No results for input(s): "GLUCAP" in the last 168 hours. Lipid Profile: No results for input(s): "CHOL", "HDL", "LDLCALC", "TRIG", "CHOLHDL", "LDLDIRECT" in the last 72 hours. Thyroid Function Tests: No results for input(s): "TSH", "T4TOTAL", "FREET4", "T3FREE", "THYROIDAB" in the last 72 hours. Anemia Panel: No results for input(s): "VITAMINB12", "FOLATE", "FERRITIN", "TIBC", "IRON", "RETICCTPCT" in the last 72 hours. Urine analysis:    Component Value Date/Time   COLORURINE Straw 07/16/2012 1340   APPEARANCEUR Clear 07/16/2012 1340   LABSPEC 1.003 07/16/2012 1340   PHURINE 9.0 07/16/2012 1340   GLUCOSEU Negative 07/16/2012 1340   HGBUR Negative 07/16/2012 1340   BILIRUBINUR Negative 07/16/2012 1340   KETONESUR Negative 07/16/2012 1340   PROTEINUR Negative 07/16/2012 1340   NITRITE Negative 07/16/2012 1340   LEUKOCYTESUR Negative 07/16/2012 1340    Radiological Exams on Admission: CT Angio Chest/Abd/Pel for Dissection W and/or Wo Contrast  Result Date: 01/16/2023 CLINICAL DATA:  Tired for about a week. Upper back pain and chest pain, mild shortness of breath and dizziness worsened today  EXAM: CT ANGIOGRAPHY CHEST, ABDOMEN AND PELVIS TECHNIQUE: Non-contrast CT of the chest was initially obtained. Multidetector CT imaging through the chest, abdomen and pelvis was performed using the standard protocol during bolus administration of intravenous contrast. Multiplanar reconstructed images and MIPs were obtained and reviewed to evaluate the vascular anatomy. RADIATION DOSE REDUCTION: This exam was performed according to the departmental dose-optimization program which includes automated exposure control, adjustment of the mA and/or kV according to patient size and/or use of iterative reconstruction technique. CONTRAST:  OMNIPAQUE IOHEXOL 350 MG/ML SOLN COMPARISON:  Radiograph 01/15/2023 FINDINGS: CTA CHEST FINDINGS Cardiovascular: No intramural hematoma, aneurysm, or dissection in the thoracic aorta. Coronary artery and aortic atherosclerotic calcification. No pericardial effusion. Normal heart size. Mediastinum/Nodes: Unremarkable trachea. No thoracic adenopathy. Patulous esophagus containing frothy debris and fluid. Lower esophageal wall thickening. Lungs/Pleura: Lungs are clear. No pleural effusion or pneumothorax. Musculoskeletal: No acute fracture. Review of the MIP images confirms the above findings. CTA ABDOMEN AND PELVIS FINDINGS VASCULAR Calcified atherosclerotic plaque in the aorta and its mesenteric and renal artery branches. No aneurysm or dissection. 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 Widely patent inflow and proximal outflow arteries bilaterally without aneurysm or dissection. Review of the MIP images confirms the above findings. NON-VASCULAR Hepatobiliary: Cholecystectomy. Unremarkable liver and biliary tree. Pancreas: Unremarkable. Spleen: Unremarkable. Adrenals/Urinary Tract: Normal adrenal glands and bladder. No urinary calculi or hydronephrosis. Stomach/Bowel: Normal caliber large and small bowel. Colonic  diverticulosis without diverticulitis. Appendectomy. Postoperative change of pyloroplasty. 1.5 cm fat density lesion adjacent to the pyloroplasty clips may be related to omental infarct. Lymphatic: No lymphadenopathy. Reproductive: Hysterectomy.  No adnexal mass. Other: No free intraperitoneal fluid or air. Musculoskeletal: No acute fracture. Review of the MIP images confirms the above findings. IMPRESSION: 1. No evidence of acute aortic syndrome. 2. 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 3. 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 4. Patulous esophagus containing frothy debris and fluid. Lower esophageal wall thickening. This may be due to esophagitis however  correlation with endoscopy is recommended to exclude malignancy. Aortic Atherosclerosis (ICD10-I70.0). Electronically Signed   By: Minerva Fester M.D.   On: 01/16/2023 03:34   DG Chest 2 View  Result Date: 01/15/2023 CLINICAL DATA:  Feeling tired for about a week. Upper back and chest pain. Mild shortness of breath and dizziness that worsened today EXAM: CHEST - 2 VIEW COMPARISON:  Radiographs 08/16/2017 FINDINGS: Stable cardiomediastinal silhouette. No focal consolidation, pleural effusion, or pneumothorax. No displaced rib fractures. IMPRESSION: No active cardiopulmonary disease. Electronically Signed   By: Minerva Fester M.D.   On: 01/15/2023 23:32    EKG: Independently reviewed.  ST depression and T wave inversion in lead III  and aVF and V4-V6.  Assessment/Plan Principal Problem:   NSTEMI (non-ST elevated myocardial infarction) (HCC)  (please populate well all problems here in Problem List. (For example, if patient is on BP meds at home and you resume or decide to hold them, it is a problem that needs to be her. Same for CAD, COPD, HLD and so on)  NSTEMI -Discussed with on-call cardiology, agreed with continuing ACS treatment aspirin, heparin drip and change simvastatin to atorvastatin.  Patient is borderline  bradycardic, will not initiate beta-blocker at this point -Echocardiogram -Continue to trend troponins -Ischemia study as per cardiology -Other Ddx, dissection ruled out with CTA  HTN emergency -Resolved, continue home BP meds including amlodipine and lisinopri  Anxiety/depression -Continue Lexapro  GERD -Stable, continue PPI and Carafate  DVT prophylaxis: Heparin drip Code Status: Full code Family Communication: Daughter at bedside Disposition Plan: Patient is sick with NSTEMI requiring ACS treatment and inpatient cardiology workup, expect more than 2 midnight hospital stay Consults called: [facility] Admission status: PCU admission   Emeline General MD [facility] Pager [phone]  01/16/2023, 9:59 AM

## 2023-01-16 NOTE — ED Provider Notes (Signed)
[facility] Provider Note    Event Date/Time   First MD Initiated Contact with Patient 01/16/23 0209     (approximate)   History   Chest Pain   HPI  Sara Huynh is a 72 y.o. female with history of hypertension, hyperlipidemia who presents emergency department with chest pressure that radiates into her back and shortness of breath today.  No nausea, vomiting, diaphoresis, dizziness, lower extremity swelling or pain.  No fever or cough.  No aggravating relieving factors.   History provided by patient, family.    Past Medical History:  Diagnosis Date   Allergy    Anxiety    Arthritis    GERD (gastroesophageal reflux disease)    Hyperlipidemia    Hypertension    Neuromuscular disorder (HCC)    NSTEMI (non-ST elevated myocardial infarction) (HCC) 01/16/2023   Osteoporosis    Shingles 12/18/2014   Ulcer     Past Surgical History:  Procedure Laterality Date   ABDOMINAL HYSTERECTOMY  1986   APPENDECTOMY     CHOLECYSTECTOMY  03/2009   ESOPHAGEAL DILATION     EYE SURGERY     G tube placed for 18 mths   2007   gi pacemaker  2002   NASAL SINUS SURGERY  2005   OOPHORECTOMY     PYLOROPLASTY  07/15/2015    MEDICATIONS:  Prior to Admission medications   Medication Sig Start Date End Date Taking? Authorizing Provider  acetaminophen (TYLENOL) 500 MG tablet Take 1,000 mg by mouth every 8 (eight) hours as needed.     [provider]  amLODipine (NORVASC) 2.5 MG tablet TAKE 1 TABLET EVERY EVENING 09/09/22   Malva Limes, MD  Cholecalciferol (VITAMIN D3) 125 MCG (5000 UT) CAPS Take by mouth.    [provider]  escitalopram (LEXAPRO) 10 MG tablet Take 1 tablet (10 mg total) by mouth daily. 09/09/22   Malva Limes, MD  lisinopril (ZESTRIL) 10 MG tablet Take 1 tablet (10 mg total) by mouth daily. 09/09/22   Malva Limes, MD  omeprazole-sodium bicarbonate (ZEGERID) 40-1100 MG capsule Take 1 capsule by mouth 2 (two) times  daily.     [provider]  ondansetron (ZOFRAN) 8 MG tablet Take 1 tablet by mouth as needed.    [provider]  polyethylene glycol (MIRALAX / GLYCOLAX) packet Take 17 g by mouth daily as needed.    [provider]  simvastatin (ZOCOR) 20 MG tablet TAKE 1 TABLET AT BEDTIME (PLEASE SCHEDULE OFFICE VISIT FOR FURTHER REFILLS) 11/23/22   Malva Limes, MD  sucralfate (CARAFATE) 1 g tablet Take 1 g by mouth 4 (four) times daily.     [provider]    Physical Exam   Triage Vital Signs: ED Triage Vitals  Encounter Vitals Group     BP 01/15/23 2221 (!) 222/105     Systolic BP Percentile --      Diastolic BP Percentile --      Pulse Rate 01/15/23 2221 73     Resp 01/15/23 2221 18     Temp 01/15/23 2221 98.7 F (37.1 C)     Temp src --      SpO2 01/15/23 2221 94 %     Weight --      Height --      Head Circumference --      Peak Flow --      Pain Score 01/15/23 2222 2     Pain Loc --  Pain Education --      Exclude from Growth Chart --     Most recent vital signs: Vitals:   01/16/23 0440 01/16/23 0513  BP: 130/78 129/77  Pulse: 73 73  Resp: 17 15  Temp:    SpO2: 97% 95%    CONSTITUTIONAL: Alert, responds appropriately to questions.  Elderly, appears uncomfortable HEAD: Normocephalic, atraumatic EYES: Conjunctivae clear, pupils appear equal, sclera nonicteric ENT: normal nose; moist mucous membranes NECK: Supple, normal ROM CARD: RRR; S1 and S2 appreciated RESP: Normal chest excursion without splinting or tachypnea; breath sounds clear and equal bilaterally; no wheezes, no rhonchi, no rales, no hypoxia or respiratory distress, speaking full sentences ABD/GI: Non-distended; soft, non-tender, no rebound, no guarding, no peritoneal signs BACK: The back appears normal EXT: Normal ROM in all joints; no deformity noted, no edema, no calf tenderness or calf swelling SKIN: Normal color for age and race; warm; no rash on exposed  skin NEURO: Moves all extremities equally, normal speech PSYCH: The patient's mood and manner are appropriate.   ED Results / Procedures / Treatments   LABS: (all labs ordered are listed, but only abnormal results are displayed) Labs Reviewed  BASIC METABOLIC PANEL - Abnormal; Notable for the following components:      Result Value   Creatinine, Ser 1.11 (*)    GFR, Estimated 53 (*)    All other components within normal limits  CBC - Abnormal; Notable for the following components:   WBC 11.9 (*)    All other components within normal limits  TROPONIN I (HIGH SENSITIVITY) - Abnormal; Notable for the following components:   Troponin I (High Sensitivity) 214 (*)    All other components within normal limits  HEPATIC FUNCTION PANEL  LIPASE, BLOOD  APTT  PROTIME-INR  HEPARIN LEVEL (UNFRACTIONATED)  TROPONIN I (HIGH SENSITIVITY)     EKG:  EKG Interpretation Date/Time:  Saturday January 15 2023 22:29:08 EDT Ventricular Rate:  76 PR Interval:  162 QRS Duration:  86 QT Interval:  398 QTC Calculation: 447 R Axis:   120  Text Interpretation: Normal sinus rhythm Right axis deviation Abnormal ECG When compared with ECG of 16-Aug-2017 17:25, PREVIOUS ECG IS PRESENT Confirmed by Rochele Raring [phone]) on 01/16/2023 2:21:13 AM           RADIOLOGY: My personal review and interpretation of imaging: CTA shows no dissection.  Chest x-ray clear.  I have personally reviewed all radiology reports.   CT Angio Chest/Abd/Pel for Dissection W and/or Wo Contrast  Result Date: 01/16/2023 CLINICAL DATA:  Tired for about a week. Upper back pain and chest pain, mild shortness of breath and dizziness worsened today EXAM: CT ANGIOGRAPHY CHEST, ABDOMEN AND PELVIS TECHNIQUE: Non-contrast CT of the chest was initially obtained. Multidetector CT imaging through the chest, abdomen and pelvis was performed using the standard protocol during bolus administration of intravenous contrast. Multiplanar  reconstructed images and MIPs were obtained and reviewed to evaluate the vascular anatomy. RADIATION DOSE REDUCTION: This exam was performed according to the departmental dose-optimization program which includes automated exposure control, adjustment of the mA and/or kV according to patient size and/or use of iterative reconstruction technique. CONTRAST:  OMNIPAQUE IOHEXOL 350 MG/ML SOLN COMPARISON:  Radiograph 01/15/2023 FINDINGS: CTA CHEST FINDINGS Cardiovascular: No intramural hematoma, aneurysm, or dissection in the thoracic aorta. Coronary artery and aortic atherosclerotic calcification. No pericardial effusion. Normal heart size. Mediastinum/Nodes: Unremarkable trachea. No thoracic adenopathy. Patulous esophagus containing frothy debris and fluid. Lower esophageal wall thickening. Lungs/Pleura: Lungs  are clear. No pleural effusion or pneumothorax. Musculoskeletal: No acute fracture. Review of the MIP images confirms the above findings. CTA ABDOMEN AND PELVIS FINDINGS VASCULAR Calcified atherosclerotic plaque in the aorta and its mesenteric and renal artery branches. No aneurysm or dissection. 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 Widely patent inflow and proximal outflow arteries bilaterally without aneurysm or dissection. Review of the MIP images confirms the above findings. NON-VASCULAR Hepatobiliary: Cholecystectomy. Unremarkable liver and biliary tree. Pancreas: Unremarkable. Spleen: Unremarkable. Adrenals/Urinary Tract: Normal adrenal glands and bladder. No urinary calculi or hydronephrosis. Stomach/Bowel: Normal caliber large and small bowel. Colonic diverticulosis without diverticulitis. Appendectomy. Postoperative change of pyloroplasty. 1.5 cm fat density lesion adjacent to the pyloroplasty clips may be related to omental infarct. Lymphatic: No lymphadenopathy. Reproductive: Hysterectomy.  No adnexal mass. Other: No free  intraperitoneal fluid or air. Musculoskeletal: No acute fracture. Review of the MIP images confirms the above findings. IMPRESSION: 1. No evidence of acute aortic syndrome. 2. 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 3. 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 4. Patulous esophagus containing frothy debris and fluid. Lower esophageal wall thickening. This may be due to esophagitis however correlation with endoscopy is recommended to exclude malignancy. Aortic Atherosclerosis (ICD10-I70.0). Electronically Signed   By: Minerva Fester M.D.   On: 01/16/2023 03:34   DG Chest 2 View  Result Date: 01/15/2023 CLINICAL DATA:  Feeling tired for about a week. Upper back and chest pain. Mild shortness of breath and dizziness that worsened today EXAM: CHEST - 2 VIEW COMPARISON:  Radiographs 08/16/2017 FINDINGS: Stable cardiomediastinal silhouette. No focal consolidation, pleural effusion, or pneumothorax. No displaced rib fractures. IMPRESSION: No active cardiopulmonary disease. Electronically Signed   By: Minerva Fester M.D.   On: 01/15/2023 23:32     PROCEDURES:  Critical Care performed: Yes, see critical care procedure note(s)   CRITICAL CARE Performed by: Baxter Hire Shyam Dawson   Total critical care time: 45 minutes  Critical care time was exclusive of separately billable procedures and treating other patients.  Critical care was necessary to treat or prevent imminent or life-threatening deterioration.  Critical care was time spent personally by me on the following activities: development of treatment plan with patient and/or surrogate as well as nursing, discussions with consultants, evaluation of patient's response to treatment, examination of patient, obtaining history from patient or surrogate, ordering and performing treatments and interventions, ordering and review of laboratory studies, ordering and review of radiographic studies, pulse oximetry and re-evaluation of patient's  condition.   Marland Kitchen1-3 Lead EKG Interpretation  Performed by: Nicholaus Steinke, Layla Maw, DO Authorized by: Jamesyn Moorefield, Layla Maw, DO     Interpretation: normal     ECG rate:  73   ECG rate assessment: normal     Rhythm: sinus rhythm     Ectopy: none     Conduction: normal       IMPRESSION / MDM / ASSESSMENT AND PLAN / ED COURSE  I reviewed the triage vital signs and the nursing notes.    Patient here with chest pain and shortness of breath.  The patient is on the cardiac monitor to evaluate for evidence of arrhythmia and/or significant heart rate changes.   DIFFERENTIAL DIAGNOSIS (includes but not limited to):   ACS, PE, dissection, pneumonia, pneumothorax, CHF, GERD, esophageal spasm   Patient's presentation is most consistent with acute presentation with potential threat to life or bodily function.   PLAN:  Will obtain cardiac labs, CT dissection study.  Initial EKG shows no ischemic change.  Will give morphine, Zofran.   MEDICATIONS GIVEN IN ED: Medications  nitroGLYCERIN (NITROSTAT) SL tablet 0.4 mg (0.4 mg Sublingual Given 01/16/23 0521)  heparin ADULT infusion 100 units/mL (25000 units/24mL) (750 Units/hr Intravenous New Bag/Given 01/16/23 0534)  morphine (PF) 4 MG/ML injection 4 mg (4 mg Intravenous Given 01/16/23 0306)  ondansetron (ZOFRAN) injection 4 mg (4 mg Intravenous Given 01/16/23 0306)  iohexol (OMNIPAQUE) 350 MG/ML injection 100 mL (100 mLs Intravenous Contrast Given 01/16/23 0249)  pantoprazole (PROTONIX) injection 40 mg (40 mg Intravenous Given 01/16/23 0359)  alum & mag hydroxide-simeth (MAALOX/MYLANTA) 200-200-20 MG/5ML suspension 30 mL (30 mLs Oral Given 01/16/23 0359)  lidocaine (XYLOCAINE) 2 % viscous mouth solution 15 mL (15 mLs Mouth/Throat Given 01/16/23 0359)  aspirin chewable tablet 243 mg (243 mg Oral Given 01/16/23 0427)  heparin bolus via infusion 3,500 Units (3,500 Units Intravenous Bolus from Bag 01/16/23 0527)     ED COURSE: Patient's first troponin negative.   Second pending.  Mildly elevated renal function which is patient's baseline.  Normal hemoglobin.  Normal LFTs and lipase.  No tenderness in the abdomen.   CT dissection study obtained given pain radiate into the back and patient was initially extremely hypertensive.  CT scan reviewed and interpreted by myself and the radiologist and shows no dissection.  There is some debris and fluid within the esophagus with mild wall thickening that could be representative of esophagitis.  Gave Protonix and GI cocktail without relief.   Patient began planing of worsening chest pain.  Repeat EKG at that time showed T wave inversions and ST depression in inferior lateral leads with no ST elevation.  Second troponin came back elevated in the 200s.  Patient given 3 nitroglycerin tablets and is now pain-free and repeat EKG has improved.  Will start heparin.  Will discuss with hospitalist for admission.   CONSULTS:  Consulted and discussed patient's case with hospitalist, Dr. Para March.  I have recommended admission and consulting physician agrees and will place admission orders.  Patient (and family if present) agree with this plan.   I reviewed all nursing notes, vitals, pertinent previous records.  All labs, EKGs, imaging ordered have been independently reviewed and interpreted by myself.    OUTSIDE RECORDS REVIEWED: Reviewed last family medicine note in April 2024.       FINAL CLINICAL IMPRESSION(S) / ED DIAGNOSES   Final diagnoses:  NSTEMI (non-ST elevated myocardial infarction) (HCC)     Rx / DC Orders   ED Discharge Orders     None        Note:  This document was prepared using Dragon voice recognition software and may include unintentional dictation errors.   Drianna Chandran, Layla Maw, DO 01/16/23 [phone]

## 2023-01-16 NOTE — ED Notes (Signed)
Advised nurse that patient has ready bed 

## 2023-01-16 NOTE — Progress Notes (Signed)
ANTICOAGULATION CONSULT NOTE  Pharmacy Consult for heparin infusion Indication: ACS/STEMI  Allergies  Allergen Reactions   Alendronate     Worsening reflux with history of scleroderma   Dicyclomine Other (See Comments)    Double vision   Prednisone Itching, Other (See Comments) and Rash    Other reaction(s): Other (See Comments) Makes skin crawl and make pt aggitated Makes skin crawl and make pt aggitated Makes skin crawl and make pt aggitated     Patient Measurements: Height: 5' (152.4 cm) Weight: 59.3 kg (130 lb 12.8 oz) IBW/kg (Calculated) : 45.5 Heparin Dosing Weight: 57.6 kg  Vital Signs: BP: 134/66 (10/06 1400) Pulse Rate: 57 (10/06 1400)  Labs: Recent Labs    01/15/23 2228 01/16/23 0304 01/16/23 0441 01/16/23 1039 01/16/23 1404  HGB 12.7  --   --   --   --   HCT 38.4  --   --   --   --   PLT 247  --   --   --   --   APTT  --   --  28  --   --   LABPROT  --   --  13.0  --   --   INR  --   --  1.0  --   --   HEPARINUNFRC  --   --   --   --  0.33  CREATININE 1.11*  --   --   --   --   TROPONINIHS 6 214*  --  88*  --     Estimated Creatinine Clearance: 36.9 mL/min (A) (by C-G formula based on SCr of 1.11 mg/dL (H)).   Medical History: Past Medical History:  Diagnosis Date   Allergy    Anxiety    Arthritis    GERD (gastroesophageal reflux disease)    Hyperlipidemia    Hypertension    Neuromuscular disorder (HCC)    NSTEMI (non-ST elevated myocardial infarction) (HCC) 01/16/2023   Osteoporosis    Shingles 12/18/2014   Ulcer     Assessment: Pt is a 72 yo female presenting to ED upper back & chest pain, SOB, & dizziness found with troponin I level, trending up.  10/6 1404  HL 0.33,  Therapeutic x1  Goal of Therapy:  Heparin level 0.3-0.7 units/ml Monitor platelets by anticoagulation protocol: Yes   Plan:  10/6 1404  HL 0.33,  Therapeutic x1 continue heparin infusion at 750 units/hr Will check confirmatory HL in 8 hr  CBC daily while on  heparin  Bari Mantis PharmD Clinical Pharmacist 01/16/2023

## 2023-01-16 NOTE — Progress Notes (Signed)
ANTICOAGULATION CONSULT NOTE  Pharmacy Consult for heparin infusion Indication: ACS/STEMI  Allergies  Allergen Reactions   Alendronate     Worsening reflux with history of scleroderma   Dicyclomine Other (See Comments)    Double vision   Prednisone Itching, Other (See Comments) and Rash    Other reaction(s): Other (See Comments) Makes skin crawl and make pt aggitated Makes skin crawl and make pt aggitated Makes skin crawl and make pt aggitated     Patient Measurements: Height: 5' (152.4 cm) Weight: 59.3 kg (130 lb 12.8 oz) IBW/kg (Calculated) : 45.5 Heparin Dosing Weight: 57.6 kg  Vital Signs: Temp: 98.7 F (37.1 C) (10/05 2221) BP: 129/77 (10/06 0513) Pulse Rate: 73 (10/06 0513)  Labs: Recent Labs    01/15/23 2228 01/16/23 0304 01/16/23 0441  HGB 12.7  --   --   HCT 38.4  --   --   PLT 247  --   --   APTT  --   --  28  CREATININE 1.11*  --   --   TROPONINIHS 6 214*  --     Estimated Creatinine Clearance: 36.9 mL/min (A) (by C-G formula based on SCr of 1.11 mg/dL (H)).   Medical History: Past Medical History:  Diagnosis Date   Allergy    Anxiety    Arthritis    GERD (gastroesophageal reflux disease)    Hyperlipidemia    Hypertension    Neuromuscular disorder (HCC)    Osteoporosis    Shingles 12/18/2014   Ulcer     Assessment: Pt is a 72 yo female presenting to ED upper back & chest pain, SOB, & dizziness found with troponin I level, trending up.  Goal of Therapy:  Heparin level 0.3-0.7 units/ml Monitor platelets by anticoagulation protocol: Yes   Plan:  Bolus 3500 units x 1 Start heparin infusion at 750 units/hr Will check HL in 8 hr after start of infusion CBC daily while on heparin  Otelia Sergeant, PharmD, [facility] 01/16/2023 5:19 AM

## 2023-01-16 NOTE — Consult Note (Signed)
[facility] Cardiology  CARDIOLOGY CONSULT NOTE  Patient ID: Sara Huynh MRN: [id number] DOB/AGE: [birth year] 72 y.o.  Admit date: 01/16/2023 Referring Physician Dr. Chipper Herb, [place] Primary Cardiologist: [facility] clinic cardiology Reason for Consultation Chest pain, NSTEMI  HPI: 72 year old female with past medical history significant for scleroderma, hypertension, hyperlipidemia who presented to hospital with chest pain her service is consulted for NSTEMI.  Patient is accompanied by her son at time of evaluation.  She lives by herself and does household activities at baseline.  In last 1 week she has been feeling poorly with increased fatigue.  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  Went inside, tried to rest and then noted blood pressure to be running high.  Due to continued symptoms she came to ED, chest tightness waxing and waning through the course.  Patient details that her blood pressure usually runs in normal range.  In ED initial EKG showed sinus rhythm without ischemic changes and negative troponin.  CT ruled out dissection.  Then she developed increased chest tightness when EKG showed ST depression/T wave inversion in inferolateral leads, symptoms resolved with 3 sublingual nitro.  Blood pressure improved after resolution of chest pain and EKG changes resolved.  This morning she is chest pain-free and resting comfortably in bed.  Repeat troponin I had chest elevated to 200.  Family history with mother and father died of MI in 48s.  Mother has history of bypass surgery.  Never smoker  Review of systems complete and found to be negative unless listed above     Past Medical History:  Diagnosis Date   Allergy    Anxiety    Arthritis    GERD (gastroesophageal reflux disease)    Hyperlipidemia    Hypertension    Neuromuscular disorder (HCC)    NSTEMI (non-ST elevated myocardial infarction)  (HCC) 01/16/2023   Osteoporosis    Shingles 12/18/2014   Ulcer     Past Surgical History:  Procedure Laterality Date   ABDOMINAL HYSTERECTOMY  1986   APPENDECTOMY     CHOLECYSTECTOMY  03/2009   ESOPHAGEAL DILATION     EYE SURGERY     G tube placed for 18 mths   2007   gi pacemaker  2002   NASAL SINUS SURGERY  2005   OOPHORECTOMY     PYLOROPLASTY  07/15/2015    (Not in a hospital admission)  Social History   Socioeconomic History   Marital status: Widowed    Spouse name: Not on file   Number of children: 3   Years of education: Not on file   Highest education level: Some college, no degree  Occupational History   Occupation: retired  Tobacco Use   Smoking status: Never   Smokeless tobacco: Never  Vaping Use   Vaping status: Never Used  Substance and Sexual Activity   Alcohol use: No   Drug use: No   Sexual activity: Not on file  Other Topics Concern   Not on file  Social History Narrative   Not on file   Social Determinants of Health   Financial Resource Strain: Low Risk  (06/29/2022)   Overall Financial Resource Strain (CARDIA)    Difficulty of Paying Living Expenses: Not hard at all  Food Insecurity: No Food Insecurity (06/29/2022)   Hunger Vital Sign    Worried About Running Out of Food in the Last Year: Never true  Ran Out of Food in the Last Year: Never true  Transportation Needs: No Transportation Needs (06/29/2022)   PRAPARE - Administrator, Civil Service (Medical): No    Lack of Transportation (Non-Medical): No  Physical Activity: Insufficiently Active (06/29/2022)   Exercise Vital Sign    Days of Exercise per Week: 4 days    Minutes of Exercise per Session: 20 min  Stress: No Stress Concern Present (06/29/2022)   [employer] of Occupational Health - Occupational Stress Questionnaire    Feeling of Stress : Not at all  Social Connections: Unknown (06/29/2022)   Social Connection and Isolation Panel [NHANES]    Frequency of  Communication with Friends and Family: More than three times a week    Frequency of Social Gatherings with Friends and Family: Three times a week    Attends Religious Services: Not on file    Active Member of Clubs or Organizations: Yes    Attends Club or Organization Meetings: More than 4 times per year    Marital Status: Widowed  Intimate Partner Violence: Not At Risk (06/30/2022)   Humiliation, Afraid, Rape, and Kick questionnaire    Fear of Current or Ex-Partner: No    Emotionally Abused: No    Physically Abused: No    Sexually Abused: No    Family History  Problem Relation Age of Onset   CAD Mother    Heart disease Mother        MI   Dementia Mother    Alzheimer's disease Mother    CVA Father    COPD Father    Emphysema Father    Breast cancer Neg Hx       Review of systems complete and found to be negative unless listed above      PHYSICAL EXAM  Alert and oriented x 3 No wheeze or crackles No significant murmur No pedal edema No JVD  Labs:   Lab Results  Component Value Date   WBC 11.9 (H) 01/15/2023   HGB 12.7 01/15/2023   HCT 38.4 01/15/2023   MCV 86.7 01/15/2023   PLT 247 01/15/2023    Recent Labs  Lab 01/15/23 2228  NA 137  K 3.8  CL 102  CO2 25  BUN 11  CREATININE 1.11*  CALCIUM 9.3  PROT 7.7  BILITOT 1.0  ALKPHOS 58  ALT 20  AST 26  GLUCOSE 91   Lab Results  Component Value Date   CKTOTAL 70 07/16/2012   CKMB < 0.5 (L) 07/16/2012   TROPONINI <0.03 08/16/2017    Lab Results  Component Value Date   CHOL 221 (H) 07/12/2022   CHOL 204 (H) 01/13/2021   CHOL 221 (H) 01/23/2020   Lab Results  Component Value Date   HDL 53 07/12/2022   HDL 54 01/13/2021   HDL 49 01/23/2020   Lab Results  Component Value Date   LDLCALC 121 (H) 07/12/2022   LDLCALC 106 (H) 01/13/2021   LDLCALC 105 (H) 01/23/2020   Lab Results  Component Value Date   TRIG 267 (H) 07/12/2022   TRIG 257 (H) 01/13/2021   TRIG 398 (H) 01/23/2020   Lab  Results  Component Value Date   CHOLHDL 3.8 01/13/2021   CHOLHDL 4.5 (H) 01/23/2020   CHOLHDL 4.1 01/01/2019   No results found for: "LDLDIRECT"    Radiology: CT Angio Chest/Abd/Pel for Dissection W and/or Wo Contrast  Result Date: 01/16/2023 CLINICAL DATA:  Tired for about a week. Upper back pain and  chest pain, mild shortness of breath and dizziness worsened today EXAM: CT ANGIOGRAPHY CHEST, ABDOMEN AND PELVIS TECHNIQUE: Non-contrast CT of the chest was initially obtained. Multidetector CT imaging through the chest, abdomen and pelvis was performed using the standard protocol during bolus administration of intravenous contrast. Multiplanar reconstructed images and MIPs were obtained and reviewed to evaluate the vascular anatomy. RADIATION DOSE REDUCTION: This exam was performed according to the departmental dose-optimization program which includes automated exposure control, adjustment of the mA and/or kV according to patient size and/or use of iterative reconstruction technique. CONTRAST:  OMNIPAQUE IOHEXOL 350 MG/ML SOLN COMPARISON:  Radiograph 01/15/2023 FINDINGS: CTA CHEST FINDINGS Cardiovascular: No intramural hematoma, aneurysm, or dissection in the thoracic aorta. Coronary artery and aortic atherosclerotic calcification. No pericardial effusion. Normal heart size. Mediastinum/Nodes: Unremarkable trachea. No thoracic adenopathy. Patulous esophagus containing frothy debris and fluid. Lower esophageal wall thickening. Lungs/Pleura: Lungs are clear. No pleural effusion or pneumothorax. Musculoskeletal: No acute fracture. Review of the MIP images confirms the above findings. CTA ABDOMEN AND PELVIS FINDINGS VASCULAR Calcified atherosclerotic plaque in the aorta and its mesenteric and renal artery branches. No aneurysm or dissection. 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 Widely patent inflow and proximal outflow arteries bilaterally  without aneurysm or dissection. Review of the MIP images confirms the above findings. NON-VASCULAR Hepatobiliary: Cholecystectomy. Unremarkable liver and biliary tree. Pancreas: Unremarkable. Spleen: Unremarkable. Adrenals/Urinary Tract: Normal adrenal glands and bladder. No urinary calculi or hydronephrosis. Stomach/Bowel: Normal caliber large and small bowel. Colonic diverticulosis without diverticulitis. Appendectomy. Postoperative change of pyloroplasty. 1.5 cm fat density lesion adjacent to the pyloroplasty clips may be related to omental infarct. Lymphatic: No lymphadenopathy. Reproductive: Hysterectomy.  No adnexal mass. Other: No free intraperitoneal fluid or air. Musculoskeletal: No acute fracture. Review of the MIP images confirms the above findings. IMPRESSION: 1. No evidence of acute aortic syndrome. 2. 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 3. 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 4. Patulous esophagus containing frothy debris and fluid. Lower esophageal wall thickening. This may be due to esophagitis however correlation with endoscopy is recommended to exclude malignancy. Aortic Atherosclerosis (ICD10-I70.0). Electronically Signed   By: Minerva Fester M.D.   On: 01/16/2023 03:34   DG Chest 2 View  Result Date: 01/15/2023 CLINICAL DATA:  Feeling tired for about a week. Upper back and chest pain. Mild shortness of breath and dizziness that worsened today EXAM: CHEST - 2 VIEW COMPARISON:  Radiographs 08/16/2017 FINDINGS: Stable cardiomediastinal silhouette. No focal consolidation, pleural effusion, or pneumothorax. No displaced rib fractures. IMPRESSION: No active cardiopulmonary disease. Electronically Signed   By: Minerva Fester M.D.   On: 01/15/2023 23:32    EKG: Normal sinus rhythm with ST-T changes in inferolateral leads which resolved on repeat EKG  ASSESSMENT AND PLAN:  NSTEMI, likely type I Hypertension Hyperlipidemia Scleroderma  Continue heparin drip,  aspirin, statin, antihypertensives.  Heart rate in 50s. Currently chest pain free. Will place Nitropaste Plan for left heart catheterization +-PCI tomorrow.  Indication/risk/benefits discussed with patient/son in detail and they are agreeable to proceed. N.p.o. after midnight. Echocardiogram pending  Signed: Kathryne Gin MD,PhD, [facility] 01/16/2023, 10:31 AM

## 2023-01-17 ENCOUNTER — Other Ambulatory Visit: Payer: Self-pay

## 2023-01-17 ENCOUNTER — Encounter
Admission: EM | Disposition: A | Discharge status: Home / Self Care | Payer: Self-pay | Source: Home / Self Care | Attending: Internal Medicine

## 2023-01-17 ENCOUNTER — Inpatient Hospital Stay
Admit: 2023-01-17 | Discharge: 2023-01-17 | Disposition: A | Discharge status: Home / Self Care | Payer: Medicare HMO | Attending: Student

## 2023-01-17 DIAGNOSIS — I214 Non-ST elevation (NSTEMI) myocardial infarction: Secondary | ICD-10-CM | POA: Diagnosis not present

## 2023-01-17 HISTORY — PX: LEFT HEART CATH AND CORONARY ANGIOGRAPHY: CATH118249

## 2023-01-17 HISTORY — PX: CORONARY STENT INTERVENTION: CATH118234

## 2023-01-17 LAB — CBC
HCT: 35.6 % — ABNORMAL LOW (ref 36.0–46.0)
Hemoglobin: 11.8 g/dL — ABNORMAL LOW (ref 12.0–15.0)
MCH: 28.4 pg (ref 26.0–34.0)
MCHC: 33.1 g/dL (ref 30.0–36.0)
MCV: 85.8 fL (ref 80.0–100.0)
Platelets: 235 10*3/uL (ref 150–400)
RBC: 4.15 MIL/uL (ref 3.87–5.11)
RDW: 14.8 % (ref 11.5–15.5)
WBC: 9.1 10*3/uL (ref 4.0–10.5)
nRBC: 0 % (ref 0.0–0.2)

## 2023-01-17 LAB — LIPID PANEL
Cholesterol: 185 mg/dL (ref 0–200)
HDL: 47 mg/dL (ref >40)
LDL Cholesterol: 86 mg/dL (ref 0–99)
Total CHOL/HDL Ratio: 3.9 {ratio}
Triglycerides: 259 mg/dL — ABNORMAL HIGH (ref <150)
VLDL: 52 mg/dL — ABNORMAL HIGH (ref 0–40)

## 2023-01-17 LAB — HEPARIN LEVEL (UNFRACTIONATED): Heparin Unfractionated: 0.47 [IU]/mL (ref 0.30–0.70)

## 2023-01-17 LAB — POCT ACTIVATED CLOTTING TIME
Activated Clotting Time: 281 s
Activated Clotting Time: 293 s

## 2023-01-17 LAB — BASIC METABOLIC PANEL
Anion gap: 7 (ref 5–15)
BUN: 13 mg/dL (ref 8–23)
CO2: 27 mmol/L (ref 22–32)
Calcium: 9.2 mg/dL (ref 8.9–10.3)
Chloride: 103 mmol/L (ref 98–111)
Creatinine, Ser: 1.08 mg/dL — ABNORMAL HIGH (ref 0.44–1.00)
GFR, Estimated: 55 mL/min — ABNORMAL LOW (ref >60)
Glucose, Bld: 93 mg/dL (ref 70–99)
Potassium: 4.1 mmol/L (ref 3.5–5.1)
Sodium: 137 mmol/L (ref 135–145)

## 2023-01-17 LAB — ECHOCARDIOGRAM COMPLETE
Area-P 1/2: 2.69 cm2
Height: 60 in
S' Lateral: 2.8 cm
Weight: 2092.8 [oz_av]

## 2023-01-17 SURGERY — LEFT HEART CATH AND CORONARY ANGIOGRAPHY
Anesthesia: Moderate Sedation

## 2023-01-17 MED ORDER — ONDANSETRON HCL 4 MG/2ML IJ SOLN
INTRAMUSCULAR | Status: AC
  Filled 2023-01-17: qty 2

## 2023-01-17 MED ORDER — SODIUM CHLORIDE 0.9 % IV SOLN: 250.0000 mL | INTRAVENOUS | Status: DC | PRN

## 2023-01-17 MED ORDER — VERAPAMIL HCL 2.5 MG/ML IV SOLN
INTRAVENOUS | Status: AC
  Filled 2023-01-17: qty 2

## 2023-01-17 MED ORDER — IOHEXOL 300 MG/ML  SOLN
INTRAMUSCULAR | Status: DC | PRN
  Administered 2023-01-17: 234 mL

## 2023-01-17 MED ORDER — ASPIRIN 81 MG PO CHEW: 81.0000 mg | CHEWABLE_TABLET | ORAL | Status: AC

## 2023-01-17 MED ORDER — ONDANSETRON HCL 4 MG/2ML IJ SOLN: 4.0000 mg | Freq: Four times a day (QID) | INTRAMUSCULAR | Status: DC | PRN

## 2023-01-17 MED ORDER — ASPIRIN 81 MG PO CHEW: 81.0000 mg | CHEWABLE_TABLET | ORAL | Status: DC

## 2023-01-17 MED ORDER — SODIUM CHLORIDE 0.9% FLUSH: 3.0000 mL | INTRAVENOUS | Status: DC | PRN

## 2023-01-17 MED ORDER — LABETALOL HCL 5 MG/ML IV SOLN
INTRAVENOUS | Status: DC | PRN
  Administered 2023-01-17 (×2): 10 mg via INTRAVENOUS

## 2023-01-17 MED ORDER — LIDOCAINE HCL (PF) 1 % IJ SOLN
INTRAMUSCULAR | Status: DC | PRN
  Administered 2023-01-17: 2 mL

## 2023-01-17 MED ORDER — SODIUM CHLORIDE 0.9 % WEIGHT BASED INFUSION: 1.0000 mL/kg/h | INTRAVENOUS | Status: DC

## 2023-01-17 MED ORDER — SODIUM CHLORIDE 0.9 % WEIGHT BASED INFUSION
1.0000 mL/kg/h | INTRAVENOUS | Status: DC
  Administered 2023-01-17: 1 mL/kg/h via INTRAVENOUS

## 2023-01-17 MED ORDER — SODIUM CHLORIDE 0.9 % WEIGHT BASED INFUSION: 3.0000 mL/kg/h | INTRAVENOUS | Status: DC

## 2023-01-17 MED ORDER — HEPARIN SODIUM (PORCINE) 1000 UNIT/ML IJ SOLN
INTRAMUSCULAR | Status: AC
  Filled 2023-01-17: qty 10

## 2023-01-17 MED ORDER — TICAGRELOR 90 MG PO TABS
ORAL_TABLET | ORAL | Status: AC
  Filled 2023-01-17: qty 2

## 2023-01-17 MED ORDER — MIDAZOLAM HCL 2 MG/2ML IJ SOLN
INTRAMUSCULAR | Status: AC
  Filled 2023-01-17: qty 2

## 2023-01-17 MED ORDER — VERAPAMIL HCL 2.5 MG/ML IV SOLN
INTRAVENOUS | Status: DC | PRN
  Administered 2023-01-17: 2.5 mg via INTRA_ARTERIAL

## 2023-01-17 MED ORDER — LIDOCAINE HCL 1 % IJ SOLN
INTRAMUSCULAR | Status: AC
  Filled 2023-01-17: qty 20

## 2023-01-17 MED ORDER — HYDRALAZINE HCL 20 MG/ML IJ SOLN: 10.0000 mg | INTRAMUSCULAR | Status: AC | PRN

## 2023-01-17 MED ORDER — FENTANYL CITRATE (PF) 100 MCG/2ML IJ SOLN
INTRAMUSCULAR | Status: AC
  Filled 2023-01-17: qty 2

## 2023-01-17 MED ORDER — LABETALOL HCL 5 MG/ML IV SOLN: 10.0000 mg | INTRAVENOUS | Status: AC | PRN

## 2023-01-17 MED ORDER — HEPARIN (PORCINE) IN NACL 1000-0.9 UT/500ML-% IV SOLN
INTRAVENOUS | Status: AC
  Filled 2023-01-17: qty 1000

## 2023-01-17 MED ORDER — ONDANSETRON HCL 4 MG/2ML IJ SOLN
4.0000 mg | Freq: Once | INTRAMUSCULAR | Status: AC
  Administered 2023-01-17: 4 mg via INTRAVENOUS

## 2023-01-17 MED ORDER — HEPARIN SODIUM (PORCINE) 1000 UNIT/ML IJ SOLN
INTRAMUSCULAR | Status: DC | PRN
  Administered 2023-01-17: 2500 [IU] via INTRAVENOUS
  Administered 2023-01-17: 5000 [IU] via INTRAVENOUS
  Administered 2023-01-17: 2000 [IU] via INTRAVENOUS

## 2023-01-17 MED ORDER — TICAGRELOR 90 MG PO TABS
ORAL_TABLET | ORAL | Status: DC | PRN
  Administered 2023-01-17: 180 mg via ORAL

## 2023-01-17 MED ORDER — MIDAZOLAM HCL 2 MG/2ML IJ SOLN
INTRAMUSCULAR | Status: DC | PRN
  Administered 2023-01-17: 1 mg via INTRAVENOUS

## 2023-01-17 MED ORDER — ASPIRIN 81 MG PO CHEW
CHEWABLE_TABLET | ORAL | Status: DC | PRN
  Administered 2023-01-17: 243 mg via ORAL

## 2023-01-17 MED ORDER — ACETAMINOPHEN 325 MG PO TABS
ORAL_TABLET | ORAL | Status: AC
  Filled 2023-01-17: qty 2

## 2023-01-17 MED ORDER — NITROGLYCERIN 1 MG/10 ML FOR IR/CATH LAB
INTRA_ARTERIAL | Status: DC | PRN
  Administered 2023-01-17 (×2): 200 ug via INTRACORONARY

## 2023-01-17 MED ORDER — TICAGRELOR 90 MG PO TABS
90.0000 mg | ORAL_TABLET | Freq: Two times a day (BID) | ORAL | Status: DC
  Administered 2023-01-18: 90 mg via ORAL
  Filled 2023-01-17: qty 1

## 2023-01-17 MED ORDER — ACETAMINOPHEN 325 MG PO TABS
650.0000 mg | ORAL_TABLET | ORAL | Status: DC | PRN
  Administered 2023-01-17: 650 mg via ORAL

## 2023-01-17 MED ORDER — ASPIRIN 81 MG PO CHEW
CHEWABLE_TABLET | ORAL | Status: AC
  Filled 2023-01-17: qty 3

## 2023-01-17 MED ORDER — LABETALOL HCL 5 MG/ML IV SOLN
INTRAVENOUS | Status: AC
  Filled 2023-01-17: qty 4

## 2023-01-17 MED ORDER — HEPARIN (PORCINE) IN NACL 1000-0.9 UT/500ML-% IV SOLN
INTRAVENOUS | Status: DC | PRN
  Administered 2023-01-17 (×2): 500 mL

## 2023-01-17 MED ORDER — ASPIRIN 81 MG PO CHEW
81.0000 mg | CHEWABLE_TABLET | Freq: Every day | ORAL | Status: DC
  Administered 2023-01-18: 81 mg via ORAL
  Filled 2023-01-17: qty 1

## 2023-01-17 MED ORDER — SODIUM CHLORIDE 0.9% FLUSH
3.0000 mL | Freq: Two times a day (BID) | INTRAVENOUS | Status: DC
  Administered 2023-01-18: 3 mL via INTRAVENOUS

## 2023-01-17 MED ORDER — ALPRAZOLAM 0.25 MG PO TABS
0.2500 mg | ORAL_TABLET | Freq: Three times a day (TID) | ORAL | Status: DC | PRN
  Administered 2023-01-17: 0.25 mg via ORAL
  Filled 2023-01-17: qty 1

## 2023-01-17 MED ORDER — SODIUM CHLORIDE 0.9 % WEIGHT BASED INFUSION
1.0000 mL/kg/h | INTRAVENOUS | Status: AC
  Administered 2023-01-17: 1 mL/kg/h via INTRAVENOUS

## 2023-01-17 MED ORDER — FENTANYL CITRATE (PF) 100 MCG/2ML IJ SOLN
INTRAMUSCULAR | Status: DC | PRN
  Administered 2023-01-17: 25 ug via INTRAVENOUS

## 2023-01-17 MED ORDER — NITROGLYCERIN 1 MG/10 ML FOR IR/CATH LAB
INTRA_ARTERIAL | Status: AC
  Filled 2023-01-17: qty 10

## 2023-01-17 SURGICAL SUPPLY — 17 items
BALLN TREK RX 2.5X15 (BALLOONS) ×1
BALLOON TREK RX 2.5X15 (BALLOONS) IMPLANT
CATH 5FR JL3.5 JR4 ANG PIG MP (CATHETERS) IMPLANT
CATH VISTA GUIDE 6FR XB3 (CATHETERS) IMPLANT
DEVICE RAD TR BAND REGULAR (VASCULAR PRODUCTS) IMPLANT
DRAPE BRACHIAL (DRAPES) IMPLANT
GLIDESHEATH SLEND SS 6F .021 (SHEATH) IMPLANT
GUIDEWIRE INQWIRE 1.5J.035X260 (WIRE) IMPLANT
INQWIRE 1.5J .035X260CM (WIRE) ×1
KIT ENCORE 26 ADVANTAGE (KITS) IMPLANT
PACK CARDIAC CATH (CUSTOM PROCEDURE TRAY) ×1 IMPLANT
PROTECTION STATION PRESSURIZED (MISCELLANEOUS) ×1
SET ATX-X65L (MISCELLANEOUS) IMPLANT
STATION PROTECTION PRESSURIZED (MISCELLANEOUS) IMPLANT
STENT ONYX FRONTIER 2.5X22 (Permanent Stent) IMPLANT
TUBING CIL FLEX 10 FLL-RA (TUBING) IMPLANT
WIRE G HI TQ BMW 190 (WIRE) IMPLANT

## 2023-01-17 NOTE — Progress Notes (Signed)
[facility] CLINIC CARDIOLOGY PROGRESS NOTE   Patient ID: Sara Huynh MRN: [id number] DOB/AGE: 72/17/1952 72 y.o.  Admit date: 01/16/2023 Referring Physician Dr. Mikey College Primary Physician Dr. Mila Merry Primary Cardiologist none Reason for Consultation chest pain, NSTEMI  HPI: CRISSY MCCREADIE is a 72 y.o. female with a past medical history of hypertension, hyperlipidemia, scleroderma who presented to the ED on 01/16/2023 for chest pain. Cardiology was consulted for further evaluation.   Interval History:  -Patient feeling well this AM, denies any recurrence of chest pain.  -BP and HR remain well controlled. Denies any SOB, dizziness, palpitations.  -Plan for [facility] today.   Review of systems complete and found to be negative unless listed above    Vitals:   01/16/23 1959 01/16/23 2331 01/17/23 0406 01/17/23 0438  BP: 123/66 111/62 107/61 (!) 104/54  Pulse: 66 69 67 69  Resp: 18 18 18 20   Temp: 98.3 F (36.8 C) 98.5 F (36.9 C) 98.8 F (37.1 C) 98.3 F (36.8 C)  TempSrc:      SpO2: 94% 90% 92% 96%  Weight:      Height:         Intake/Output Summary (Last 24 hours) at 01/17/2023 0855 Last data filed at 01/17/2023 0400 Gross per 24 hour  Intake 194 ml  Output --  Net 194 ml     PHYSICAL EXAM General: Well appearing elderly female, well nourished, in no acute distress sitting upright in hospital bed with son present at bedside. HEENT: Normocephalic and atraumatic. Neck: No JVD.  Lungs: Normal respiratory effort on room air. Clear bilaterally to auscultation. No wheezes, crackles, rhonchi.  Heart: HRRR. Normal S1 and S2 without gallops or murmurs. Radial & DP pulses 2+ bilaterally. Abdomen: Non-distended appearing.  Msk: Normal strength and tone for age. Extremities: No clubbing, cyanosis or edema.   Neuro: Alert and oriented X 3. Psych: Mood appropriate, affect congruent.    LABS: Basic Metabolic Panel: Recent Labs    01/15/23 2228 01/17/23 0603  NA 137  137  K 3.8 4.1  CL 102 103  CO2 25 27  GLUCOSE 91 93  BUN 11 13  CREATININE 1.11* 1.08*  CALCIUM 9.3 9.2   Liver Function Tests: Recent Labs    01/15/23 2228  AST 26  ALT 20  ALKPHOS 58  BILITOT 1.0  PROT 7.7  ALBUMIN 4.3   Recent Labs    01/15/23 2228  LIPASE 47   CBC: Recent Labs    01/15/23 2228 01/17/23 0603  WBC 11.9* 9.1  HGB 12.7 11.8*  HCT 38.4 35.6*  MCV 86.7 85.8  PLT 247 235   Cardiac Enzymes: Recent Labs    01/15/23 2228 01/16/23 0304 01/16/23 1039  TROPONINIHS 6 214* 88*   BNP: No results for input(s): "BNP" in the last 72 hours. D-Dimer: No results for input(s): "DDIMER" in the last 72 hours. Hemoglobin A1C: No results for input(s): "HGBA1C" in the last 72 hours. Fasting Lipid Panel: No results for input(s): "CHOL", "HDL", "LDLCALC", "TRIG", "CHOLHDL", "LDLDIRECT" in the last 72 hours. Thyroid Function Tests: No results for input(s): "TSH", "T4TOTAL", "T3FREE", "THYROIDAB" in the last 72 hours.  Invalid input(s): "FREET3" Anemia Panel: No results for input(s): "VITAMINB12", "FOLATE", "FERRITIN", "TIBC", "IRON", "RETICCTPCT" in the last 72 hours.  CT Angio Chest/Abd/Pel for Dissection W and/or Wo Contrast  Result Date: 01/16/2023 CLINICAL DATA:  Tired for about a week. Upper back pain and chest pain, mild shortness of breath and dizziness worsened today EXAM: CT  ANGIOGRAPHY CHEST, ABDOMEN AND PELVIS TECHNIQUE: Non-contrast CT of the chest was initially obtained. Multidetector CT imaging through the chest, abdomen and pelvis was performed using the standard protocol during bolus administration of intravenous contrast. Multiplanar reconstructed images and MIPs were obtained and reviewed to evaluate the vascular anatomy. RADIATION DOSE REDUCTION: This exam was performed according to the departmental dose-optimization program which includes automated exposure control, adjustment of the mA and/or kV according to patient size and/or use of iterative  reconstruction technique. CONTRAST:  OMNIPAQUE IOHEXOL 350 MG/ML SOLN COMPARISON:  Radiograph 01/15/2023 FINDINGS: CTA CHEST FINDINGS Cardiovascular: No intramural hematoma, aneurysm, or dissection in the thoracic aorta. Coronary artery and aortic atherosclerotic calcification. No pericardial effusion. Normal heart size. Mediastinum/Nodes: Unremarkable trachea. No thoracic adenopathy. Patulous esophagus containing frothy debris and fluid. Lower esophageal wall thickening. Lungs/Pleura: Lungs are clear. No pleural effusion or pneumothorax. Musculoskeletal: No acute fracture. Review of the MIP images confirms the above findings. CTA ABDOMEN AND PELVIS FINDINGS VASCULAR Calcified atherosclerotic plaque in the aorta and its mesenteric and renal artery branches. No aneurysm or dissection. 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 Widely patent inflow and proximal outflow arteries bilaterally without aneurysm or dissection. Review of the MIP images confirms the above findings. NON-VASCULAR Hepatobiliary: Cholecystectomy. Unremarkable liver and biliary tree. Pancreas: Unremarkable. Spleen: Unremarkable. Adrenals/Urinary Tract: Normal adrenal glands and bladder. No urinary calculi or hydronephrosis. Stomach/Bowel: Normal caliber large and small bowel. Colonic diverticulosis without diverticulitis. Appendectomy. Postoperative change of pyloroplasty. 1.5 cm fat density lesion adjacent to the pyloroplasty clips may be related to omental infarct. Lymphatic: No lymphadenopathy. Reproductive: Hysterectomy.  No adnexal mass. Other: No free intraperitoneal fluid or air. Musculoskeletal: No acute fracture. Review of the MIP images confirms the above findings. IMPRESSION: 1. No evidence of acute aortic syndrome. 2. 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 3. 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 4. Patulous esophagus containing frothy debris and  fluid. Lower esophageal wall thickening. This may be due to esophagitis however correlation with endoscopy is recommended to exclude malignancy. Aortic Atherosclerosis (ICD10-I70.0). Electronically Signed   By: Minerva Fester M.D.   On: 01/16/2023 03:34   DG Chest 2 View  Result Date: 01/15/2023 CLINICAL DATA:  Feeling tired for about a week. Upper back and chest pain. Mild shortness of breath and dizziness that worsened today EXAM: CHEST - 2 VIEW COMPARISON:  Radiographs 08/16/2017 FINDINGS: Stable cardiomediastinal silhouette. No focal consolidation, pleural effusion, or pneumothorax. No displaced rib fractures. IMPRESSION: No active cardiopulmonary disease. Electronically Signed   By: Minerva Fester M.D.   On: 01/15/2023 23:32     ECHO pending  TELEMETRY reviewed by me 01/17/23: sinus rhythm rate 60s  EKG reviewed by me 01/17/23: Normal sinus rhythm with ST-T changes in inferolateral leads, resolved on repeat  DATA reviewed by me 01/17/23: last 24h vitals tele labs imaging I/O hospitalist progress note  Principal Problem:   NSTEMI (non-ST elevated myocardial infarction) (HCC)    ASSESSMENT AND PLAN: DARIANNA AMY is a 72 y.o. female with a past medical history of hypertension, hyperlipidemia, scleroderma who presented to the ED on 01/16/2023 for chest pain. Cardiology was consulted for further evaluation.   # Chest pain # NSTEMI Patient initially presenting with chest pain found to have troponins 6 > 214 > 88. Symptoms resolved with nitroglycerin. S/p aspirin 325, started on heparin infusion. Remains without recurrence of chest pain.  -Echo pending -  Continue heparin infusion. Continue aspirin 81 mg daily.  -Discussed the risks and benefits of proceeding with LHC for further evaluation with the patient.  She is agreeable to proceed.  NPO until LHC this afternoon (01/17/2023) with Dr. Juliann Pares.  Written consent will be obtained.  Further recommendations following LHC.    #  Hypertension # Hyperlipidemia Patient with history of hypertension, hyperlipidemia. BP controlled throughout admission. Most recent lipid panel 07/2022 with TC 221, LDL 121.  -Simvastatin changed to atorvastatin.  -Continue lisinopril 10 mg daily, amlodipine 2.5 mg daily.   This patient's case was discussed and created with Dr. Juliann Pares and he is in agreement.  Signed:  Gale Journey, PA-C  01/17/2023, 8:55 AM [facility] Cardiology

## 2023-01-17 NOTE — Plan of Care (Signed)

## 2023-01-17 NOTE — Progress Notes (Signed)
ANTICOAGULATION CONSULT NOTE  Pharmacy Consult for heparin infusion Indication: ACS/STEMI  Allergies  Allergen Reactions   Alendronate     Worsening reflux with history of scleroderma   Dicyclomine Other (See Comments)    Double vision   Prednisone Itching, Other (See Comments) and Rash    Other reaction(s): Other (See Comments) Makes skin crawl and make pt aggitated Makes skin crawl and make pt aggitated Makes skin crawl and make pt aggitated     Patient Measurements: Height: 5' (152.4 cm) Weight: 59.3 kg (130 lb 12.8 oz) IBW/kg (Calculated) : 45.5 Heparin Dosing Weight: 57.6 kg  Vital Signs: Temp: 98.3 F (36.8 C) (10/07 0438) BP: 104/54 (10/07 0438) Pulse Rate: 69 (10/07 0438)  Labs: Recent Labs    01/15/23 2228 01/16/23 0304 01/16/23 0441 01/16/23 1039 01/16/23 1404 01/16/23 2145 01/17/23 0603  HGB 12.7  --   --   --   --   --  11.8*  HCT 38.4  --   --   --   --   --  35.6*  PLT 247  --   --   --   --   --  235  APTT  --   --  28  --   --   --   --   LABPROT  --   --  13.0  --   --   --   --   INR  --   --  1.0  --   --   --   --   HEPARINUNFRC  --   --   --   --  0.33 0.18* 0.47  CREATININE 1.11*  --   --   --   --   --   --   TROPONINIHS 6 214*  --  88*  --   --   --     Estimated Creatinine Clearance: 36.9 mL/min (A) (by C-G formula based on SCr of 1.11 mg/dL (H)).   Medical History: Past Medical History:  Diagnosis Date   Allergy    Anxiety    Arthritis    GERD (gastroesophageal reflux disease)    Hyperlipidemia    Hypertension    Neuromuscular disorder (HCC)    NSTEMI (non-ST elevated myocardial infarction) (HCC) 01/16/2023   Osteoporosis    Shingles 12/18/2014   Ulcer     Assessment: Pt is a 72 yo female presenting to ED upper back & chest pain, SOB, & dizziness found with troponin I level, trending up.  10/6 1404  HL 0.33,  Therapeutic x1 10/6 2145 HL 0.18, subtherapeutic 10/07 0603 HL 0.47, therapeutic x 1  Goal of Therapy:   Heparin level 0.3-0.7 units/ml Monitor platelets by anticoagulation protocol: Yes   Plan:  Continue heparin infusion to 900 units/hr Cath procedure scheduled for ~1130 today Pharmacy to follow up anticoag plan post procedure CBC daily while on heparin  Otelia Sergeant, PharmD, [facility] 01/17/2023 6:56 AM

## 2023-01-17 NOTE — Progress Notes (Signed)
Transition of Care [facility]) - Inpatient Brief Assessment   Patient Details  Name: Sara Huynh MRN: [id number] Date of Birth: 1950-04-30  Transition of Care [facility]) CM/SW Contact:    Darolyn Rua, LCSW Phone Number: 01/17/2023, 9:20 AM   Clinical Narrative:  Patient presents to ED with chest pressure, SOB, cards consulted, plan for hep drip and LHC today. PCP is Dr. Sherrie Mustache, insurance is [place].   No TOC needs noted at this time, please consult TOC should needs arise.   Transition of Care Asessment: Insurance and Status: Insurance coverage has been reviewed Patient has primary care physician: Yes Home environment has been reviewed: from home Prior level of function:: independent Prior/Current Home Services: No current home services Social Determinants of Health Reivew: SDOH reviewed no interventions necessary Readmission risk has been reviewed: Yes Transition of care needs: no transition of care needs at this time

## 2023-01-17 NOTE — Progress Notes (Signed)
PROGRESS NOTE    Sara Huynh  YQM:[id number] DOB: 01-Jan-1951 DOA: 01/16/2023 PCP: Malva Limes, MD    Brief Narrative:  72 y.o. female with medical history significant of HTN, HLD, anxiety/depression, NSTEMI with remote cardiac cath but no stenting, anxiety/depression and GERD, presented with new onset of chest pain and back pain.   Patient reports that yesterday daytime she did quite some yard work and felt really exhausted at the end of the day.  At daytime family checked her blood pressure and found systolic more than 200.  In the evening while watching TV she started to feel severe back pain sharp-like, then she started to feel pressure-like chest pain 6-7/10 associated with shortness of breath and family called EMS.  She described the chest pain as pressure-like, nonradiating and constant.  Seen in consultation by cardiology.  Plan for left heart catheterization 10/7.  Assessment & Plan:   Principal Problem:   NSTEMI (non-ST elevated myocardial infarction) [facility])  NSTEMI -Discussed with on-call cardiology, agreed with continuing ACS treatment aspirin, heparin drip and change simvastatin to atorvastatin.  Patient is borderline bradycardic, will not initiate beta-blocker at this point Plan: Left heart catheterization today 2D echocardiogram Further recommendations post cath   HTN emergency Resolved.  Home medications including amlodipine and lisinopril resumed.  Avoid beta-blockade at this time due to resting bradycardia   Anxiety/depression -Continue Lexapro   GERD -Stable, continue PPI and Carafate     DVT prophylaxis: IV heparin Code Status: Full Family Communication: Son at bedside 10/7 Disposition Plan: Status is: Inpatient Remains inpatient appropriate because: NSTEMI   Level of care: Progressive  Consultants:  Cardiology-[facility]  Procedures:  Left heart catheterization 10/7  Antimicrobials: None   Subjective: Seen and examined.  Resting in bed.   No reports of chest pain.  Feels anxious about upcoming procedure.  Objective: Vitals:   01/17/23 0406 01/17/23 0438 01/17/23 0900 01/17/23 1058  BP: 107/61 (!) 104/54 116/65 (!) 158/73  Pulse: 67 69 72 74  Resp: 18 20 16 17   Temp: 98.8 F (37.1 C) 98.3 F (36.8 C) 98.4 F (36.9 C) 98.5 F (36.9 C)  TempSrc:   Oral Oral  SpO2: 92% 96% 91% 96%  Weight:      Height:        Intake/Output Summary (Last 24 hours) at 01/17/2023 1216 Last data filed at 01/17/2023 0400 Gross per 24 hour  Intake 194 ml  Output --  Net 194 ml   Filed Weights   01/16/23 0504  Weight: 59.3 kg    Examination:  General exam: Appears calm and comfortable  Respiratory system: Clear to auscultation. Respiratory effort normal. Cardiovascular system: S1-S2, RRR, no murmurs, no pedal edema Gastrointestinal system: Thin, soft, NT/ND, normal bowel sounds Central nervous system: Alert and oriented. No focal neurological deficits. Extremities: Symmetric 5 x 5 power. Skin: No rashes, lesions or ulcers Psychiatry: Judgement and insight appear normal. Mood & affect nervous.     Data Reviewed: I have personally reviewed following labs and imaging studies  CBC: Recent Labs  Lab 01/15/23 2228 01/17/23 0603  WBC 11.9* 9.1  HGB 12.7 11.8*  HCT 38.4 35.6*  MCV 86.7 85.8  PLT 247 235   Basic Metabolic Panel: Recent Labs  Lab 01/15/23 2228 01/17/23 0603  NA 137 137  K 3.8 4.1  CL 102 103  CO2 25 27  GLUCOSE 91 93  BUN 11 13  CREATININE 1.11* 1.08*  CALCIUM 9.3 9.2   GFR: Estimated Creatinine Clearance: 37.9  mL/min (A) (by C-G formula based on SCr of 1.08 mg/dL (H)). Liver Function Tests: Recent Labs  Lab 01/15/23 2228  AST 26  ALT 20  ALKPHOS 58  BILITOT 1.0  PROT 7.7  ALBUMIN 4.3   Recent Labs  Lab 01/15/23 2228  LIPASE 47   No results for input(s): "AMMONIA" in the last 168 hours. Coagulation Profile: Recent Labs  Lab 01/16/23 0441  INR 1.0   Cardiac Enzymes: No results  for input(s): "CKTOTAL", "CKMB", "CKMBINDEX", "TROPONINI" in the last 168 hours. BNP (last 3 results) No results for input(s): "PROBNP" in the last 8760 hours. HbA1C: No results for input(s): "HGBA1C" in the last 72 hours. CBG: No results for input(s): "GLUCAP" in the last 168 hours. Lipid Profile: Recent Labs    01/17/23 0603  CHOL 185  HDL 47  LDLCALC 86  TRIG 259*  CHOLHDL 3.9   Thyroid Function Tests: No results for input(s): "TSH", "T4TOTAL", "FREET4", "T3FREE", "THYROIDAB" in the last 72 hours. Anemia Panel: No results for input(s): "VITAMINB12", "FOLATE", "FERRITIN", "TIBC", "IRON", "RETICCTPCT" in the last 72 hours. Sepsis Labs: No results for input(s): "PROCALCITON", "LATICACIDVEN" in the last 168 hours.  No results found for this or any previous visit (from the past 240 hour(s)).       Radiology Studies: CT Angio Chest/Abd/Pel for Dissection W and/or Wo Contrast  Result Date: 01/16/2023 CLINICAL DATA:  Tired for about a week. Upper back pain and chest pain, mild shortness of breath and dizziness worsened today EXAM: CT ANGIOGRAPHY CHEST, ABDOMEN AND PELVIS TECHNIQUE: Non-contrast CT of the chest was initially obtained. Multidetector CT imaging through the chest, abdomen and pelvis was performed using the standard protocol during bolus administration of intravenous contrast. Multiplanar reconstructed images and MIPs were obtained and reviewed to evaluate the vascular anatomy. RADIATION DOSE REDUCTION: This exam was performed according to the departmental dose-optimization program which includes automated exposure control, adjustment of the mA and/or kV according to patient size and/or use of iterative reconstruction technique. CONTRAST:  OMNIPAQUE IOHEXOL 350 MG/ML SOLN COMPARISON:  Radiograph 01/15/2023 FINDINGS: CTA CHEST FINDINGS Cardiovascular: No intramural hematoma, aneurysm, or dissection in the thoracic aorta. Coronary artery and aortic atherosclerotic  calcification. No pericardial effusion. Normal heart size. Mediastinum/Nodes: Unremarkable trachea. No thoracic adenopathy. Patulous esophagus containing frothy debris and fluid. Lower esophageal wall thickening. Lungs/Pleura: Lungs are clear. No pleural effusion or pneumothorax. Musculoskeletal: No acute fracture. Review of the MIP images confirms the above findings. CTA ABDOMEN AND PELVIS FINDINGS VASCULAR Calcified atherosclerotic plaque in the aorta and its mesenteric and renal artery branches. No aneurysm or dissection. 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 Widely patent inflow and proximal outflow arteries bilaterally without aneurysm or dissection. Review of the MIP images confirms the above findings. NON-VASCULAR Hepatobiliary: Cholecystectomy. Unremarkable liver and biliary tree. Pancreas: Unremarkable. Spleen: Unremarkable. Adrenals/Urinary Tract: Normal adrenal glands and bladder. No urinary calculi or hydronephrosis. Stomach/Bowel: Normal caliber large and small bowel. Colonic diverticulosis without diverticulitis. Appendectomy. Postoperative change of pyloroplasty. 1.5 cm fat density lesion adjacent to the pyloroplasty clips may be related to omental infarct. Lymphatic: No lymphadenopathy. Reproductive: Hysterectomy.  No adnexal mass. Other: No free intraperitoneal fluid or air. Musculoskeletal: No acute fracture. Review of the MIP images confirms the above findings. IMPRESSION: 1. No evidence of acute aortic syndrome. 2. 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 3. 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 4. Patulous esophagus  containing frothy debris and fluid. Lower esophageal wall thickening. This may be due to esophagitis however correlation with endoscopy is recommended to exclude malignancy. Aortic Atherosclerosis (ICD10-I70.0). Electronically Signed   By: Minerva Fester M.D.   On: 01/16/2023 03:34   DG Chest 2  View  Result Date: 01/15/2023 CLINICAL DATA:  Feeling tired for about a week. Upper back and chest pain. Mild shortness of breath and dizziness that worsened today EXAM: CHEST - 2 VIEW COMPARISON:  Radiographs 08/16/2017 FINDINGS: Stable cardiomediastinal silhouette. No focal consolidation, pleural effusion, or pneumothorax. No displaced rib fractures. IMPRESSION: No active cardiopulmonary disease. Electronically Signed   By: Minerva Fester M.D.   On: 01/15/2023 23:32        Scheduled Meds:  [MAR Hold] amLODipine  2.5 mg Oral QPM   [MAR Hold] aspirin EC  81 mg Oral Daily   [MAR Hold] atorvastatin  40 mg Oral Daily   [MAR Hold] escitalopram  10 mg Oral Daily   [MAR Hold] lidocaine  1 patch Transdermal Q24H   [MAR Hold] lisinopril  10 mg Oral Daily   ondansetron (ZOFRAN) IV  4 mg Intravenous Once   [MAR Hold] pantoprazole  40 mg Oral Daily   [MAR Hold] sodium chloride flush  3 mL Intravenous Q12H   [MAR Hold] sucralfate  1 g Oral QID   Continuous Infusions:  sodium chloride     [START ON 01/18/2023] sodium chloride 3 mL/kg/hr (01/17/23 1117)   Followed by   Melene Muller ON 01/18/2023] sodium chloride Stopped (01/17/23 1117)   heparin 900 Units/hr (01/17/23 0719)     LOS: 1 day     Tresa Moore, MD [facility]   If 7PM-7AM, please contact night-coverage  01/17/2023, 12:16 PM

## 2023-01-17 NOTE — CV Procedure (Signed)
Brief cath note  Inpatient diagnostic cardiac cath possible PCI and stent because of non-STEMI unstable angina  Right radial approach  Left ventriculogram normal left ventricular function EF of 60% normal wall motion  Coronaries Left main Short free of disease LAD large minor irregularities 50% mid Circumflex large 90% mid IRA TIMI-3 flow RCA large minor irregularities 50% mid Right dominant system  Intervention Successful PCI and stent DES to proximal to mid circumflex 2.5 x 22 mm frontier Onyx deployed at 16 atm reducing the lesion from 90 down to 0  Patient on aspirin Brilinta Heparin discontinued TR band in place Dissipate discharge tomorrow morning within 23 hours

## 2023-01-18 ENCOUNTER — Encounter: Payer: Self-pay | Admitting: Internal Medicine

## 2023-01-18 ENCOUNTER — Other Ambulatory Visit: Payer: Self-pay

## 2023-01-18 ENCOUNTER — Other Ambulatory Visit (HOSPITAL_COMMUNITY): Payer: Self-pay

## 2023-01-18 DIAGNOSIS — I214 Non-ST elevation (NSTEMI) myocardial infarction: Secondary | ICD-10-CM | POA: Diagnosis not present

## 2023-01-18 LAB — BASIC METABOLIC PANEL
Anion gap: 10 (ref 5–15)
BUN: 10 mg/dL (ref 8–23)
CO2: 24 mmol/L (ref 22–32)
Calcium: 8.9 mg/dL (ref 8.9–10.3)
Chloride: 104 mmol/L (ref 98–111)
Creatinine, Ser: 1.05 mg/dL — ABNORMAL HIGH (ref 0.44–1.00)
GFR, Estimated: 56 mL/min — ABNORMAL LOW (ref >60)
Glucose, Bld: 91 mg/dL (ref 70–99)
Potassium: 3.8 mmol/L (ref 3.5–5.1)
Sodium: 138 mmol/L (ref 135–145)

## 2023-01-18 LAB — CBC
HCT: 34.5 % — ABNORMAL LOW (ref 36.0–46.0)
Hemoglobin: 11.2 g/dL — ABNORMAL LOW (ref 12.0–15.0)
MCH: 27.7 pg (ref 26.0–34.0)
MCHC: 32.5 g/dL (ref 30.0–36.0)
MCV: 85.4 fL (ref 80.0–100.0)
Platelets: 236 10*3/uL (ref 150–400)
RBC: 4.04 MIL/uL (ref 3.87–5.11)
RDW: 14.6 % (ref 11.5–15.5)
WBC: 10.2 10*3/uL (ref 4.0–10.5)
nRBC: 0 % (ref 0.0–0.2)

## 2023-01-18 LAB — CARDIAC CATHETERIZATION: Cath EF Quantitative: 60 %

## 2023-01-18 MED ORDER — ATORVASTATIN CALCIUM 40 MG PO TABS
40.0000 mg | ORAL_TABLET | Freq: Every day | ORAL | 1 refills | Status: DC
  Filled 2023-01-18: qty 30, 30d supply, fill #0
  Filled 2023-02-28: qty 30, 30d supply, fill #1

## 2023-01-18 MED ORDER — ASPIRIN 81 MG PO TBEC
81.0000 mg | DELAYED_RELEASE_TABLET | Freq: Every day | ORAL | Status: AC
Start: 2023-01-18 — End: 2024-01-18

## 2023-01-18 MED ORDER — AMLODIPINE BESYLATE 5 MG PO TABS: 5.0000 mg | ORAL_TABLET | Freq: Every evening | ORAL | Status: DC

## 2023-01-18 MED ORDER — AMLODIPINE BESYLATE 5 MG PO TABS
2.5000 mg | ORAL_TABLET | Freq: Every day | ORAL | Status: DC
  Administered 2023-01-18: 2.5 mg via ORAL
  Filled 2023-01-18: qty 1

## 2023-01-18 MED ORDER — TICAGRELOR 90 MG PO TABS
90.0000 mg | ORAL_TABLET | Freq: Two times a day (BID) | ORAL | 1 refills | Status: AC
  Filled 2023-01-18: qty 60, 30d supply, fill #0

## 2023-01-18 NOTE — Progress Notes (Signed)
[facility] CARDIOLOGY PROGRESS NOTE   Patient ID: Sara Huynh MRN: [id number] DOB/AGE: Aug 11, 1950 73 y.o.  Admit date: 01/16/2023 Referring Physician Dr. Mikey College Primary Physician Dr. Mila Merry Primary Cardiologist none Reason for Consultation chest pain, NSTEMI  HPI: Sara Huynh is a 72 y.o. female with a past medical history of hypertension, hyperlipidemia, scleroderma who presented to the ED on 01/16/2023 for chest pain. Cardiology was consulted for further evaluation.   Interval History:  -Patient reports feeling well this AM, denies any recurrence of chest pain.  -BP elevated overnight, did not receive PM dose of amlodipine. -R wrist without pain, bruising.   Review of systems complete and found to be negative unless listed above    Vitals:   01/17/23 2117 01/17/23 2329 01/18/23 0337 01/18/23 0839  BP: (!) 144/76 (!) 151/66 (!) 169/75 136/70  Pulse: 71 66 70 73  Resp: 17 16 17  (!) 21  Temp: 98.6 F (37 C) 99.3 F (37.4 C) 100 F (37.8 C) 98.3 F (36.8 C)  TempSrc:      SpO2: 97% 97% 93% 94%  Weight:      Height:         Intake/Output Summary (Last 24 hours) at 01/18/2023 0844 Last data filed at 01/17/2023 2144 Gross per 24 hour  Intake 1615.19 ml  Output --  Net 1615.19 ml     PHYSICAL EXAM General: Well appearing elderly female, well nourished, in no acute distress resting comfortably in hospital bed. HEENT: Normocephalic and atraumatic. Neck: No JVD.  Lungs: Normal respiratory effort on room air. Clear bilaterally to auscultation. No wheezes, crackles, rhonchi.  Heart: HRRR. Normal S1 and S2 without gallops or murmurs. Radial & DP pulses 2+ bilaterally. Abdomen: Non-distended appearing.  Msk: Normal strength and tone for age. Extremities: No clubbing, cyanosis or edema.  R radial access site without evidence of bleeding, bruising, or tenderness. Gauze and tegaderm in place, CDI. 2+ radial pulses. Neuro: Alert and oriented X 3. Psych:  Mood appropriate, affect congruent.    LABS: Basic Metabolic Panel: Recent Labs    01/17/23 0603 01/18/23 0545  NA 137 138  K 4.1 3.8  CL 103 104  CO2 27 24  GLUCOSE 93 91  BUN 13 10  CREATININE 1.08* 1.05*  CALCIUM 9.2 8.9   Liver Function Tests: Recent Labs    01/15/23 2228  AST 26  ALT 20  ALKPHOS 58  BILITOT 1.0  PROT 7.7  ALBUMIN 4.3   Recent Labs    01/15/23 2228  LIPASE 47   CBC: Recent Labs    01/17/23 0603 01/18/23 0545  WBC 9.1 10.2  HGB 11.8* 11.2*  HCT 35.6* 34.5*  MCV 85.8 85.4  PLT 235 236   Cardiac Enzymes: Recent Labs    01/15/23 2228 01/16/23 0304 01/16/23 1039  TROPONINIHS 6 214* 88*   BNP: No results for input(s): "BNP" in the last 72 hours. D-Dimer: No results for input(s): "DDIMER" in the last 72 hours. Hemoglobin A1C: No results for input(s): "HGBA1C" in the last 72 hours. Fasting Lipid Panel: Recent Labs    01/17/23 0603  CHOL 185  HDL 47  LDLCALC 86  TRIG 259*  CHOLHDL 3.9   Thyroid Function Tests: No results for input(s): "TSH", "T4TOTAL", "T3FREE", "THYROIDAB" in the last 72 hours.  Invalid input(s): "FREET3" Anemia Panel: No results for input(s): "VITAMINB12", "FOLATE", "FERRITIN", "TIBC", "IRON", "RETICCTPCT" in the last 72 hours.  CARDIAC CATHETERIZATION  Result Date: 01/18/2023   Mid Cx lesion  is 90% stenosed.   Prox RCA lesion is 50% stenosed.   Mid LAD lesion is 50% stenosed.   A drug-eluting stent was successfully placed using a STENT ONYX FRONTIER 2.5X22.   Post intervention, there is a 0% residual stenosis.   The left ventricular systolic function is normal.   LV end diastolic pressure is normal.   The left ventricular ejection fraction is 55-65% by visual estimate.   There is no mitral valve regurgitation.   In the absence of any other complications or medical issues, we expect the patient to be ready for discharge from an interventional cardiology perspective on 01/18/2023.   Recommend uninterrupted dual  antiplatelet therapy with Aspirin 81mg  daily and Ticagrelor 90mg  twice daily for a minimum of 12 months (ACS-Class I recommendation). Conclusion Inpatient diagnostic cardiac cath non-STEMI elevated troponin unstable angina Left ventriculogram showed normal left ventricular function EF of 60% Coronaries Left main minor irregularities moderate calcification LAD large 50% proximal nonobstructive Circumflex large 90% proximal to mid IRA large vessel RCA large mild to moderate 50% lesions noted Right dominant system Intervention Successful PCI and stent proximal to mid circumflex with DES 2.5 x 22 mm frontier Onyx up to 16 atm Lesion reduced from 90 down to 0 TIMI-3 flow maintained throughout Catheters sheaths stent balloon wire removed Patient maintained on aspirin Brilinta for 12 months Patient transferred to specials recovery Anticipate discharge within 23 hours Patient to follow-up with cardiology 1 to 2 weeks as an outpatient No complications   ECHOCARDIOGRAM COMPLETE  Result Date: 01/17/2023    ECHOCARDIOGRAM REPORT   Patient Name:   Sara Huynh Date of Exam: 01/17/2023 Medical Rec #:  [id number]        Height:       60.0 in Accession #:    [id number]       Weight:       130.8 lb Date of Birth:  Aug 01, 1950        BSA:          1.558 m Patient Age:    72 years         BP:           107/54 mmHg Patient Gender: F                HR:           64 bpm. Exam Location:  [facility] Procedure: 2D Echo, Cardiac Doppler and Color Doppler Indications:     I21.4 NSTEMI  History:         Patient has no prior history of Echocardiogram examinations.                  Risk Factors:Hypertension and Dyslipidemia. NSTEMI.  Sonographer:     Daphine Deutscher RDCS Referring Phys:  [id number] Dorian Pod Eloni Darius Diagnosing Phys: Alwyn Pea MD IMPRESSIONS  1. Left ventricular ejection fraction, by estimation, is 65 to 70%. The left ventricle has normal function. The left ventricle has no regional wall motion abnormalities. Left  ventricular diastolic parameters are consistent with Grade I diastolic dysfunction (impaired relaxation).  2. Right ventricular systolic function is normal. The right ventricular size is normal.  3. The mitral valve is normal in structure. Trivial mitral valve regurgitation.  4. The aortic valve is normal in structure. Aortic valve regurgitation is not visualized. FINDINGS  Left Ventricle: Left ventricular ejection fraction, by estimation, is 65 to 70%. The left ventricle has normal function. The left ventricle has no regional wall motion abnormalities.  The left ventricular internal cavity size was normal in size. There is  no left ventricular hypertrophy. Left ventricular diastolic parameters are consistent with Grade I diastolic dysfunction (impaired relaxation). Right Ventricle: The right ventricular size is normal. No increase in right ventricular wall thickness. Right ventricular systolic function is normal. Left Atrium: Left atrial size was normal in size. Right Atrium: Right atrial size was normal in size. Pericardium: There is no evidence of pericardial effusion. Mitral Valve: The mitral valve is normal in structure. Trivial mitral valve regurgitation. Tricuspid Valve: The tricuspid valve is normal in structure. Tricuspid valve regurgitation is mild. Aortic Valve: The aortic valve is normal in structure. Aortic valve regurgitation is not visualized. Pulmonic Valve: The pulmonic valve was normal in structure. Pulmonic valve regurgitation is not visualized. Aorta: The ascending aorta was not well visualized. IAS/Shunts: No atrial level shunt detected by color flow Doppler.  LEFT VENTRICLE PLAX 2D LVIDd:         4.50 cm   Diastology LVIDs:         2.80 cm   LV e' medial:    7.85 cm/s LV PW:         1.00 cm   LV E/e' medial:  7.4 LV IVS:        1.00 cm   LV e' lateral:   10.42 cm/s LVOT diam:     2.00 cm   LV E/e' lateral: 5.6 LV SV:         62 LV SV Index:   40 LVOT Area:     3.14 cm  RIGHT VENTRICLE              IVC RV Basal diam:  2.80 cm     IVC diam: 1.70 cm RV S prime:     12.75 cm/s TAPSE (M-mode): 2.4 cm LEFT ATRIUM             Index        RIGHT ATRIUM           Index LA diam:        3.50 cm 2.25 cm/m   RA Area:     10.30 cm LA Vol (A2C):   24.6 ml 15.79 ml/m  RA Volume:   24.20 ml  15.53 ml/m LA Vol (A4C):   34.8 ml 22.33 ml/m LA Biplane Vol: 29.5 ml 18.93 ml/m  AORTIC VALVE LVOT Vmax:   96.37 cm/s LVOT Vmean:  63.367 cm/s LVOT VTI:    0.198 m  AORTA Ao Root diam: 3.00 cm Ao Asc diam:  3.00 cm MITRAL VALVE MV Area (PHT): 2.69 cm    SHUNTS MV Decel Time: 282 msec    Systemic VTI:  0.20 m MV E velocity: 58.45 cm/s  Systemic Diam: 2.00 cm MV A velocity: 91.15 cm/s MV E/A ratio:  0.64 Dwayne D Callwood MD Electronically signed by Alwyn Pea MD Signature Date/Time: 01/17/2023/11:00:16 PM    Final      ECHO as above  TELEMETRY reviewed by me 01/18/23: sinus rhythm/sinus brady rates 50-60s  EKG reviewed by me 01/18/23: Normal sinus rhythm with ST-T changes in inferolateral leads, resolved on repeat  DATA reviewed by me 01/18/23: last 24h vitals tele labs imaging I/O hospitalist progress note  Principal Problem:   NSTEMI (non-ST elevated myocardial infarction) (HCC)    ASSESSMENT AND PLAN: Sara Huynh is a 72 y.o. female with a past medical history of hypertension, hyperlipidemia, scleroderma who presented to the ED on 01/16/2023 for chest  pain. Cardiology was consulted for further evaluation.   # Chest pain # NSTEMI Patient initially presenting with chest pain found to have troponins 6 > 214 > 88. Symptoms resolved with nitroglycerin. S/p aspirin 325, started on heparin infusion. Remains without recurrence of chest pain. LHC yesterday with DES to prox to mid LCx (2.5 x 22 mm frontier Onyx). Echo with normal EF. -Plan for DAPT uninterrupted x12 months with aspirin 81 mg daily and brilinta 90 mg bid. -Defer addition of beta blocker at this time due to baseline bradycardia. Consider  outpatient pending HR.   # Hypertension # Hyperlipidemia Patient with history of hypertension, hyperlipidemia. BP controlled throughout admission. Most recent lipid panel 07/2022 with TC 221, LDL 121.  -Simvastatin changed to atorvastatin.  -Continue lisinopril 10 mg daily, amlodipine 2.5 mg daily.  -Patient advised to monitor BP daily at home.   Ok for discharge today from a cardiac perspective. Will arrange for follow up in clinic with Dr. Juliann Pares in 1-2 weeks.    This patient's case was discussed and created with Dr. Juliann Pares and he is in agreement.  Signed:  Gale Journey, PA-C  01/18/2023, 8:44 AM [facility] Cardiology

## 2023-01-18 NOTE — TOC Benefit Eligibility Note (Signed)
Patient Product/process development scientist completed.    The patient is insured through [employer]. Patient has Medicare and is not eligible for a copay card, but may be able to apply for patient assistance, if available.    Ran test claim for Brilinta 90 mg and the current 30 day co-pay is $45.00.   This test claim was processed through [facility]- copay amounts may vary at other pharmacies due to pharmacy/plan contracts, or as the patient moves through the different stages of their insurance plan.     Roland Earl, CPHT Pharmacy Technician III Certified Patient Advocate [facility] Patient Advocate Team Direct Number: [phone]  Fax: [phone]

## 2023-01-18 NOTE — Consult Note (Signed)
[facility] Customer service manager [facility]) Accountable Care Organization (ACO) [facility] Liaison Note  01/18/2023  Sara Huynh 1951-02-01 [id number]  Location: [facility] RN Hospital Liaison screened the patient remotely at [facility].  Insurance: [employer] HMO   Sara Huynh is a 72 y.o. female who is a Primary Care Patient of Malva Limes, MD. The patient was screened for  readmission hospitalization with noted low risk score for unplanned readmission risk with 1 IP in 6 months.  The patient was assessed for potential [facility]) Care Management service needs for post hospital transition for care coordination. Review of patient's electronic medical record reveals patient was admitted for NSTEMI. Pt will be discharged home with self-care and no anticipated needs.   Plan: [facility] Liaison will continue to follow progress and disposition to asess for post hospital community care coordination/management needs.  Referral request for community care coordination: anticipate [facility] Transitions of Care Team follow up.   [facility] Care Management/Population Health does not replace or interfere with any arrangements made by the Inpatient Transition of Care team.   For questions contact:   Elliot Cousin, RN, [facility] Liaison [place]   Population Health Office Hours MTWF  8:00 am-6:00 pm [phone] mobile [phone] [Office toll free line] Office Hours are M-F 8:30 - 5 pm [name]

## 2023-01-18 NOTE — Discharge Summary (Signed)
Physician Discharge Summary  Sara Huynh WUJ:[id number] DOB: Nov 22, 1950 DOA: 01/16/2023  PCP: Malva Limes, MD  Admit date: 01/16/2023 Discharge date: 01/18/2023  Admitted From: Home Disposition:  Home  Recommendations for Outpatient Follow-up:  Follow up with PCP in 1-2 weeks Follow-up with cardiology 1 week   Home Health: No Equipment/Devices: None  Discharge Condition: Stable CODE STATUS: Full Diet recommendation: Heart healthy  Brief/Interim Summary:  72 y.o. female with medical history significant of HTN, HLD, anxiety/depression, NSTEMI with remote cardiac cath but no stenting, anxiety/depression and GERD, presented with new onset of chest pain and back pain.   Patient reports that yesterday daytime she did quite some yard work and felt really exhausted at the end of the day.  At daytime family checked her blood pressure and found systolic more than 200.  In the evening while watching TV she started to feel severe back pain sharp-like, then she started to feel pressure-like chest pain 6-7/10 associated with shortness of breath and family called EMS.  She described the chest pain as pressure-like, nonradiating and constant.   Seen in consultation by cardiology.  Plan for left heart catheterization 10/7. Status post left heart catheterization with 1 drug-eluting stent placed in left circumflex.  Stable post catheterization.  No chest pain.  Monitored overnight and discharged the following morning.    Discharge Diagnoses:  Principal Problem:   NSTEMI (non-ST elevated myocardial infarction) [facility])  NSTEMI -Discussed with on-call cardiology, agreed with continuing ACS treatment aspirin, heparin drip and change simvastatin to atorvastatin.  Patient is borderline bradycardic, will not initiate beta-blocker at this point Plan: Status post left heart catheterization 10/7.  Underwent PCI status post DES to left circumflex.  Tolerated procedure well.  Discharged the following day  on DAPT aspirin and Brilinta.  Follow-up outpatient cardiology 1 week.   HTN emergency Resolved.  Home medications including amlodipine and lisinopril resumed.  Avoid beta-blockade at this time due to resting bradycardia   Anxiety/depression -Continue Lexapro   GERD -Stable, continue PPI and Carafate     Discharge Instructions  Discharge Instructions     AMB Referral to Cardiac Rehabilitation - Phase II   Complete by: As directed    Diagnosis:  NSTEMI Coronary Stents     After initial evaluation and assessments completed: Virtual Based Care may be provided alone or in conjunction with Phase 2 Cardiac Rehab based on patient barriers.: Yes   Diet - low sodium heart healthy   Complete by: As directed    Increase activity slowly   Complete by: As directed       Allergies as of 01/18/2023       Reactions   Alendronate    Worsening reflux with history of scleroderma   Dicyclomine Other (See Comments)   Double vision   Prednisone Itching, Other (See Comments), Rash   Other reaction(s): Other (See Comments) Makes skin crawl and make pt aggitated Makes skin crawl and make pt aggitated Makes skin crawl and make pt aggitated        Medication List     STOP taking these medications    simvastatin 20 MG tablet Commonly known as: ZOCOR       TAKE these medications    acetaminophen 500 MG tablet Commonly known as: TYLENOL Take 1,000 mg by mouth every 8 (eight) hours as needed.   amLODipine 2.5 MG tablet Commonly known as: NORVASC TAKE 1 TABLET EVERY EVENING   aspirin EC 81 MG tablet Take 1 tablet (81 mg  total) by mouth daily. Swallow whole.   atorvastatin 40 MG tablet Commonly known as: LIPITOR Take 1 tablet (40 mg total) by mouth daily. Start taking on: January 19, 2023   Brilinta 90 MG Tabs tablet Generic drug: ticagrelor Take 1 tablet (90 mg total) by mouth 2 (two) times daily.   cholecalciferol 25 MCG (1000 UNIT) tablet Commonly known as: VITAMIN  D3 Take 1,000 Units by mouth daily.   escitalopram 10 MG tablet Commonly known as: LEXAPRO Take 1 tablet (10 mg total) by mouth daily.   lisinopril 10 MG tablet Commonly known as: ZESTRIL Take 1 tablet (10 mg total) by mouth daily.   multivitamin with minerals tablet Take 1 tablet by mouth daily.   omeprazole-sodium bicarbonate 40-1100 MG capsule Commonly known as: ZEGERID Take 1 capsule by mouth 2 (two) times daily.   ondansetron 8 MG tablet Commonly known as: ZOFRAN Take 1 tablet by mouth as needed.   polyethylene glycol 17 g packet Commonly known as: MIRALAX / GLYCOLAX Take 17 g by mouth daily as needed.   sucralfate 1 g tablet Commonly known as: CARAFATE Take 1 g by mouth 4 (four) times daily.        Follow-up Information     Alwyn Pea, MD. Go in 1 week(s).   Specialties: Cardiology, Internal Medicine Why: Appointment on Thursday, 01/27/2023 at 11:15am. Contact information: [address] [place] [address] [phone]                Allergies  Allergen Reactions   Alendronate     Worsening reflux with history of scleroderma   Dicyclomine Other (See Comments)    Double vision   Prednisone Itching, Other (See Comments) and Rash    Other reaction(s): Other (See Comments) Makes skin crawl and make pt aggitated Makes skin crawl and make pt aggitated Makes skin crawl and make pt aggitated     Consultations: Cardiology   Procedures/Studies: CARDIAC CATHETERIZATION  Result Date: 01/18/2023   Mid Cx lesion is 90% stenosed.   Prox RCA lesion is 50% stenosed.   Mid LAD lesion is 50% stenosed.   A drug-eluting stent was successfully placed using a STENT ONYX FRONTIER 2.5X22.   Post intervention, there is a 0% residual stenosis.   The left ventricular systolic function is normal.   LV end diastolic pressure is normal.   The left ventricular ejection fraction is 55-65% by visual estimate.   There is no mitral valve regurgitation.   In  the absence of any other complications or medical issues, we expect the patient to be ready for discharge from an interventional cardiology perspective on 01/18/2023.   Recommend uninterrupted dual antiplatelet therapy with Aspirin 81mg  daily and Ticagrelor 90mg  twice daily for a minimum of 12 months (ACS-Class I recommendation). Conclusion Inpatient diagnostic cardiac cath non-STEMI elevated troponin unstable angina Left ventriculogram showed normal left ventricular function EF of 60% Coronaries Left main minor irregularities moderate calcification LAD large 50% proximal nonobstructive Circumflex large 90% proximal to mid IRA large vessel RCA large mild to moderate 50% lesions noted Right dominant system Intervention Successful PCI and stent proximal to mid circumflex with DES 2.5 x 22 mm frontier Onyx up to 16 atm Lesion reduced from 90 down to 0 TIMI-3 flow maintained throughout Catheters sheaths stent balloon wire removed Patient maintained on aspirin Brilinta for 12 months Patient transferred to specials recovery Anticipate discharge within 23 hours Patient to follow-up with cardiology 1 to 2 weeks as an outpatient No  complications   ECHOCARDIOGRAM COMPLETE  Result Date: 01/17/2023    ECHOCARDIOGRAM REPORT   Patient Name:   Sara Huynh Date of Exam: 01/17/2023 Medical Rec #:  [id number]        Height:       60.0 in Accession #:    [id number]       Weight:       130.8 lb Date of Birth:  11-04-1950        BSA:          1.558 m Patient Age:    72 years         BP:           107/54 mmHg Patient Gender: F                HR:           64 bpm. Exam Location:  [facility] Procedure: 2D Echo, Cardiac Doppler and Color Doppler Indications:     I21.4 NSTEMI  History:         Patient has no prior history of Echocardiogram examinations.                  Risk Factors:Hypertension and Dyslipidemia. NSTEMI.  Sonographer:     Daphine Deutscher RDCS Referring Phys:  [id number] Dorian Pod HUDSON Diagnosing Phys: Alwyn Pea  MD IMPRESSIONS  1. Left ventricular ejection fraction, by estimation, is 65 to 70%. The left ventricle has normal function. The left ventricle has no regional wall motion abnormalities. Left ventricular diastolic parameters are consistent with Grade I diastolic dysfunction (impaired relaxation).  2. Right ventricular systolic function is normal. The right ventricular size is normal.  3. The mitral valve is normal in structure. Trivial mitral valve regurgitation.  4. The aortic valve is normal in structure. Aortic valve regurgitation is not visualized. FINDINGS  Left Ventricle: Left ventricular ejection fraction, by estimation, is 65 to 70%. The left ventricle has normal function. The left ventricle has no regional wall motion abnormalities. The left ventricular internal cavity size was normal in size. There is  no left ventricular hypertrophy. Left ventricular diastolic parameters are consistent with Grade I diastolic dysfunction (impaired relaxation). Right Ventricle: The right ventricular size is normal. No increase in right ventricular wall thickness. Right ventricular systolic function is normal. Left Atrium: Left atrial size was normal in size. Right Atrium: Right atrial size was normal in size. Pericardium: There is no evidence of pericardial effusion. Mitral Valve: The mitral valve is normal in structure. Trivial mitral valve regurgitation. Tricuspid Valve: The tricuspid valve is normal in structure. Tricuspid valve regurgitation is mild. Aortic Valve: The aortic valve is normal in structure. Aortic valve regurgitation is not visualized. Pulmonic Valve: The pulmonic valve was normal in structure. Pulmonic valve regurgitation is not visualized. Aorta: The ascending aorta was not well visualized. IAS/Shunts: No atrial level shunt detected by color flow Doppler.  LEFT VENTRICLE PLAX 2D LVIDd:         4.50 cm   Diastology LVIDs:         2.80 cm   LV e' medial:    7.85 cm/s LV PW:         1.00 cm   LV E/e' medial:   7.4 LV IVS:        1.00 cm   LV e' lateral:   10.42 cm/s LVOT diam:     2.00 cm   LV E/e' lateral: 5.6 LV SV:  62 LV SV Index:   40 LVOT Area:     3.14 cm  RIGHT VENTRICLE             IVC RV Basal diam:  2.80 cm     IVC diam: 1.70 cm RV S prime:     12.75 cm/s TAPSE (M-mode): 2.4 cm LEFT ATRIUM             Index        RIGHT ATRIUM           Index LA diam:        3.50 cm 2.25 cm/m   RA Area:     10.30 cm LA Vol (A2C):   24.6 ml 15.79 ml/m  RA Volume:   24.20 ml  15.53 ml/m LA Vol (A4C):   34.8 ml 22.33 ml/m LA Biplane Vol: 29.5 ml 18.93 ml/m  AORTIC VALVE LVOT Vmax:   96.37 cm/s LVOT Vmean:  63.367 cm/s LVOT VTI:    0.198 m  AORTA Ao Root diam: 3.00 cm Ao Asc diam:  3.00 cm MITRAL VALVE MV Area (PHT): 2.69 cm    SHUNTS MV Decel Time: 282 msec    Systemic VTI:  0.20 m MV E velocity: 58.45 cm/s  Systemic Diam: 2.00 cm MV A velocity: 91.15 cm/s MV E/A ratio:  0.64 Dwayne D Callwood MD Electronically signed by Alwyn Pea MD Signature Date/Time: 01/17/2023/11:00:16 PM    Final    CT Angio Chest/Abd/Pel for Dissection W and/or Wo Contrast  Result Date: 01/16/2023 CLINICAL DATA:  Tired for about a week. Upper back pain and chest pain, mild shortness of breath and dizziness worsened today EXAM: CT ANGIOGRAPHY CHEST, ABDOMEN AND PELVIS TECHNIQUE: Non-contrast CT of the chest was initially obtained. Multidetector CT imaging through the chest, abdomen and pelvis was performed using the standard protocol during bolus administration of intravenous contrast. Multiplanar reconstructed images and MIPs were obtained and reviewed to evaluate the vascular anatomy. RADIATION DOSE REDUCTION: This exam was performed according to the departmental dose-optimization program which includes automated exposure control, adjustment of the mA and/or kV according to patient size and/or use of iterative reconstruction technique. CONTRAST:  OMNIPAQUE IOHEXOL 350 MG/ML SOLN COMPARISON:  Radiograph 01/15/2023 FINDINGS:  CTA CHEST FINDINGS Cardiovascular: No intramural hematoma, aneurysm, or dissection in the thoracic aorta. Coronary artery and aortic atherosclerotic calcification. No pericardial effusion. Normal heart size. Mediastinum/Nodes: Unremarkable trachea. No thoracic adenopathy. Patulous esophagus containing frothy debris and fluid. Lower esophageal wall thickening. Lungs/Pleura: Lungs are clear. No pleural effusion or pneumothorax. Musculoskeletal: No acute fracture. Review of the MIP images confirms the above findings. CTA ABDOMEN AND PELVIS FINDINGS VASCULAR Calcified atherosclerotic plaque in the aorta and its mesenteric and renal artery branches. No aneurysm or dissection. 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 Widely patent inflow and proximal outflow arteries bilaterally without aneurysm or dissection. Review of the MIP images confirms the above findings. NON-VASCULAR Hepatobiliary: Cholecystectomy. Unremarkable liver and biliary tree. Pancreas: Unremarkable. Spleen: Unremarkable. Adrenals/Urinary Tract: Normal adrenal glands and bladder. No urinary calculi or hydronephrosis. Stomach/Bowel: Normal caliber large and small bowel. Colonic diverticulosis without diverticulitis. Appendectomy. Postoperative change of pyloroplasty. 1.5 cm fat density lesion adjacent to the pyloroplasty clips may be related to omental infarct. Lymphatic: No lymphadenopathy. Reproductive: Hysterectomy.  No adnexal mass. Other: No free intraperitoneal fluid or air. Musculoskeletal: No acute fracture. Review of the MIP images confirms the above findings. IMPRESSION: 1. No  evidence of acute aortic syndrome. 2. 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 3. 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 4. Patulous esophagus containing frothy debris and fluid. Lower esophageal wall thickening. This may be due to esophagitis however correlation with endoscopy is recommended  to exclude malignancy. Aortic Atherosclerosis (ICD10-I70.0). Electronically Signed   By: Minerva Fester M.D.   On: 01/16/2023 03:34   DG Chest 2 View  Result Date: 01/15/2023 CLINICAL DATA:  Feeling tired for about a week. Upper back and chest pain. Mild shortness of breath and dizziness that worsened today EXAM: CHEST - 2 VIEW COMPARISON:  Radiographs 08/16/2017 FINDINGS: Stable cardiomediastinal silhouette. No focal consolidation, pleural effusion, or pneumothorax. No displaced rib fractures. IMPRESSION: No active cardiopulmonary disease. Electronically Signed   By: Minerva Fester M.D.   On: 01/15/2023 23:32      Subjective: Seen and examined on the day of discharge.  Stable no distress.  Chest pain-free.  Stable for discharge home.  Son at bedside.  Discharge Exam: Vitals:   01/18/23 0839 01/18/23 1143  BP: 136/70 (!) 140/73  Pulse: 73 70  Resp: (!) 21 18  Temp: 98.3 F (36.8 C) 99 F (37.2 C)  SpO2: 94% 94%   Vitals:   01/17/23 2329 01/18/23 0337 01/18/23 0839 01/18/23 1143  BP: (!) 151/66 (!) 169/75 136/70 (!) 140/73  Pulse: 66 70 73 70  Resp: 16 17 (!) 21 18  Temp: 99.3 F (37.4 C) 100 F (37.8 C) 98.3 F (36.8 C) 99 F (37.2 C)  TempSrc:    Oral  SpO2: 97% 93% 94% 94%  Weight:      Height:        General: Pt is alert, awake, not in acute distress Cardiovascular: RRR, S1/S2 +, no rubs, no gallops Respiratory: CTA bilaterally, no wheezing, no rhonchi Abdominal: Soft, NT, ND, bowel sounds + Extremities: no edema, no cyanosis    The results of significant diagnostics from this hospitalization (including imaging, microbiology, ancillary and laboratory) are listed below for reference.     Microbiology: No results found for this or any previous visit (from the past 240 hour(s)).   Labs: BNP (last 3 results) No results for input(s): "BNP" in the last 8760 hours. Basic Metabolic Panel: Recent Labs  Lab 01/15/23 2228 01/17/23 0603 01/18/23 0545  NA 137 137  138  K 3.8 4.1 3.8  CL 102 103 104  CO2 25 27 24   GLUCOSE 91 93 91  BUN 11 13 10   CREATININE 1.11* 1.08* 1.05*  CALCIUM 9.3 9.2 8.9   Liver Function Tests: Recent Labs  Lab 01/15/23 2228  AST 26  ALT 20  ALKPHOS 58  BILITOT 1.0  PROT 7.7  ALBUMIN 4.3   Recent Labs  Lab 01/15/23 2228  LIPASE 47   No results for input(s): "AMMONIA" in the last 168 hours. CBC: Recent Labs  Lab 01/15/23 2228 01/17/23 0603 01/18/23 0545  WBC 11.9* 9.1 10.2  HGB 12.7 11.8* 11.2*  HCT 38.4 35.6* 34.5*  MCV 86.7 85.8 85.4  PLT 247 235 236   Cardiac Enzymes: No results for input(s): "CKTOTAL", "CKMB", "CKMBINDEX", "TROPONINI" in the last 168 hours. BNP: Invalid input(s): "POCBNP" CBG: No results for input(s): "GLUCAP" in the last 168 hours. D-Dimer No results for input(s): "DDIMER" in the last 72 hours. Hgb A1c No results for input(s): "HGBA1C" in the last 72 hours. Lipid Profile Recent Labs    01/17/23 0603  CHOL 185  HDL 47  LDLCALC 86  TRIG 259*  CHOLHDL 3.9   Thyroid function studies No results for input(s): "TSH", "T4TOTAL", "T3FREE", "THYROIDAB" in the last 72 hours.  Invalid input(s): "FREET3" Anemia work up No results for input(s): "VITAMINB12", "FOLATE", "FERRITIN", "TIBC", "IRON", "RETICCTPCT" in the last 72 hours. Urinalysis    Component Value Date/Time   COLORURINE Straw 07/16/2012 1340   APPEARANCEUR Clear 07/16/2012 1340   LABSPEC 1.003 07/16/2012 1340   PHURINE 9.0 07/16/2012 1340   GLUCOSEU Negative 07/16/2012 1340   HGBUR Negative 07/16/2012 1340   BILIRUBINUR Negative 07/16/2012 1340   KETONESUR Negative 07/16/2012 1340   PROTEINUR Negative 07/16/2012 1340   NITRITE Negative 07/16/2012 1340   LEUKOCYTESUR Negative 07/16/2012 1340   Sepsis Labs Recent Labs  Lab 01/15/23 2228 01/17/23 0603 01/18/23 0545  WBC 11.9* 9.1 10.2   Microbiology No results found for this or any previous visit (from the past 240 hour(s)).   Time coordinating  discharge: Over 30 minutes  SIGNED:   Tresa Moore, MD  [facility] 01/18/2023, 3:16 PM Pager   If 7PM-7AM, please contact night-coverage

## 2023-01-19 ENCOUNTER — Telehealth: Payer: Self-pay

## 2023-01-19 LAB — LIPOPROTEIN A (LPA): Lipoprotein (a): <8.4 nmol/L (ref <75.0)

## 2023-01-19 NOTE — Transitions of Care (Post Inpatient/ED Visit) (Unsigned)
01/19/2023  Name: Sara Huynh MRN: [id number] DOB: 1951-03-18  Today's TOC FU Call Status: Today's TOC FU Call Status:: Successful TOC FU Call Completed TOC FU Call Complete Date: 01/18/23 Patient's Name and Date of Birth confirmed.  Transition Care Management Follow-up Telephone Call Discharge Facility: [facility]) Type of Discharge: Inpatient Admission Primary Inpatient Discharge Diagnosis:: NSTEMI How have you been since you were released from the hospital?: Better (denies chest pain,  reports weakness) Any questions or concerns?: No  Items Reviewed: Did you receive and understand the discharge instructions provided?: Yes Medications obtained,verified, and reconciled?: Yes (Medications Reviewed) Any new allergies since your discharge?: No Dietary orders reviewed?: Yes Type of Diet Ordered:: loe salt heart healthy Do you have support at home?: Yes People in Home: child(ren), adult  Medications Reviewed Today: Medications Reviewed Today     Reviewed by Earlie Server, RN (Registered Nurse) on 01/19/23 at 1230  Med List Status: <None>   Medication Order Taking? Sig Documenting Provider Last Dose Status Informant  acetaminophen (TYLENOL) 500 MG tablet [id number] Yes Take 1,000 mg by mouth every 8 (eight) hours as needed.  [provider] Taking Active Self  amLODipine (NORVASC) 2.5 MG tablet [id number] Yes TAKE 1 TABLET EVERY EVENING Malva Limes, MD Taking Active Self  aspirin EC 81 MG tablet [id number] Yes Take 1 tablet (81 mg total) by mouth daily. Swallow whole. Tresa Moore, MD Taking Active   atorvastatin (LIPITOR) 40 MG tablet [id number] Yes Take 1 tablet (40 mg total) by mouth daily. Tresa Moore, MD Taking Active   cholecalciferol (VITAMIN D3) 25 MCG (1000 UNIT) tablet [id number] Yes Take 1,000 Units by mouth daily. [provider] Taking Active Self  escitalopram (LEXAPRO) 10 MG tablet [id number] Yes Take 1  tablet (10 mg total) by mouth daily. Malva Limes, MD Taking Active Self  lisinopril (ZESTRIL) 10 MG tablet [id number] Yes Take 1 tablet (10 mg total) by mouth daily. Malva Limes, MD Taking Active Self  Multiple Vitamins-Minerals (MULTIVITAMIN WITH MINERALS) tablet [id number] Yes Take 1 tablet by mouth daily. [provider] Taking Active Self  omeprazole-sodium bicarbonate (ZEGERID) 40-1100 MG capsule [id number] Yes Take 1 capsule by mouth 2 (two) times daily.  [provider] Taking Active Self           Med Note Leron Croak, CALLIE M   Tue Jun 29, 2016  1:02 PM)    ondansetron (ZOFRAN) 8 MG tablet [id number] Yes Take 1 tablet by mouth as needed. [provider] Taking Active Self           Med Note Leron Croak, CALLIE M   Tue Jun 29, 2016  1:02 PM)    polyethylene glycol [facility] / GLYCOLAX) packet [id number] Yes Take 17 g by mouth daily as needed. [provider] Taking Active Self  sucralfate (CARAFATE) 1 g tablet [id number] Yes Take 1 g by mouth 4 (four) times daily.  [provider] Taking Active Self           Med Note Leron Croak, CALLIE M   Tue Jun 29, 2016  1:02 PM)    ticagrelor (BRILINTA) 90 MG TABS tablet [id number] Yes Take 1 tablet (90 mg total) by mouth 2 (two) times daily. Tresa Moore, MD Taking Active             Home Care and Equipment/Supplies: Were Home Health Services Ordered?: No Any new equipment or medical supplies ordered?: No  Functional Questionnaire: Do you  need assistance with bathing/showering or dressing?: No Do you need assistance with meal preparation?: No Do you need assistance with eating?: No Do you have difficulty maintaining continence: No Do you need assistance with getting out of bed/getting out of a chair/moving?: No Do you have difficulty managing or taking your medications?: No  Follow up appointments reviewed: PCP Follow-up appointment confirmed?: No (reviewed with patient the importance of timely  follow up) MD Provider Line Number:[phone] Given: No Specialist Hospital Follow-up appointment confirmed?: Yes Date of Specialist follow-up appointment?: 01/27/23 Follow-Up Specialty Provider:: Dr. Juliann Pares. [facility] clinic cardiology Do you need transportation to your follow-up appointment?: No Do you understand care options if your condition(s) worsen?: Yes-patient verbalized understanding  SDOH Interventions Today    Flowsheet Row Most Recent Value  SDOH Interventions   Food Insecurity Interventions Intervention Not Indicated  Transportation Interventions Intervention Not Indicated     12:30 pm  Patient does not know her post cath instructions. These are not on her AVS.  I have reviewed the EMR and can not see any post cath instructions. Placed call to [facility] clinic cardiology [phone] and left a message for Dr. Roslynn Amble nurse to call me back.   SIGNATURE***

## 2023-01-26 ENCOUNTER — Other Ambulatory Visit: Payer: Self-pay

## 2023-01-26 ENCOUNTER — Other Ambulatory Visit (HOSPITAL_COMMUNITY): Payer: Self-pay

## 2023-01-26 DIAGNOSIS — K3184 Gastroparesis: Secondary | ICD-10-CM | POA: Diagnosis not present

## 2023-01-26 DIAGNOSIS — K5904 Chronic idiopathic constipation: Secondary | ICD-10-CM | POA: Diagnosis not present

## 2023-01-26 DIAGNOSIS — M349 Systemic sclerosis, unspecified: Secondary | ICD-10-CM | POA: Diagnosis not present

## 2023-01-27 ENCOUNTER — Other Ambulatory Visit (HOSPITAL_COMMUNITY): Payer: Self-pay

## 2023-01-27 ENCOUNTER — Other Ambulatory Visit: Payer: Self-pay

## 2023-01-27 DIAGNOSIS — M349 Systemic sclerosis, unspecified: Secondary | ICD-10-CM | POA: Diagnosis not present

## 2023-01-27 DIAGNOSIS — I2510 Atherosclerotic heart disease of native coronary artery without angina pectoris: Secondary | ICD-10-CM | POA: Diagnosis not present

## 2023-01-27 DIAGNOSIS — Z955 Presence of coronary angioplasty implant and graft: Secondary | ICD-10-CM | POA: Diagnosis not present

## 2023-01-27 DIAGNOSIS — K219 Gastro-esophageal reflux disease without esophagitis: Secondary | ICD-10-CM | POA: Diagnosis not present

## 2023-01-27 DIAGNOSIS — I10 Essential (primary) hypertension: Secondary | ICD-10-CM | POA: Diagnosis not present

## 2023-01-27 DIAGNOSIS — E782 Mixed hyperlipidemia: Secondary | ICD-10-CM | POA: Diagnosis not present

## 2023-01-27 DIAGNOSIS — I252 Old myocardial infarction: Secondary | ICD-10-CM | POA: Diagnosis not present

## 2023-01-28 ENCOUNTER — Other Ambulatory Visit (HOSPITAL_COMMUNITY): Payer: Self-pay

## 2023-01-28 ENCOUNTER — Other Ambulatory Visit: Payer: Self-pay

## 2023-01-28 MED ORDER — APREPITANT 80 MG PO CAPS
80.0000 mg | ORAL_CAPSULE | Freq: Every day | ORAL | 0 refills | Status: DC
Start: 2023-01-28 — End: 2023-07-13
  Filled 2023-01-28: qty 4, 4d supply, fill #0

## 2023-01-28 MED ORDER — MOTEGRITY 2 MG PO TABS
1.0000 | ORAL_TABLET | Freq: Every day | ORAL | 3 refills | Status: DC
Start: 2023-01-28 — End: 2023-07-13
  Filled 2023-01-28 – 2023-02-02 (×2): qty 90, 90d supply, fill #0

## 2023-01-28 MED ORDER — RABEPRAZOLE SODIUM 10 MG PO CPSP
ORAL_CAPSULE | ORAL | 0 refills | Status: DC
Start: 2023-01-28 — End: 2023-07-13

## 2023-01-31 ENCOUNTER — Other Ambulatory Visit (HOSPITAL_COMMUNITY): Payer: Self-pay

## 2023-02-02 ENCOUNTER — Other Ambulatory Visit (HOSPITAL_COMMUNITY): Payer: Self-pay

## 2023-02-07 ENCOUNTER — Encounter (HOSPITAL_COMMUNITY): Payer: Self-pay

## 2023-02-07 ENCOUNTER — Other Ambulatory Visit (HOSPITAL_COMMUNITY): Payer: Self-pay

## 2023-03-24 ENCOUNTER — Other Ambulatory Visit (HOSPITAL_COMMUNITY): Payer: Self-pay

## 2023-03-30 DIAGNOSIS — K219 Gastro-esophageal reflux disease without esophagitis: Secondary | ICD-10-CM | POA: Diagnosis not present

## 2023-03-30 DIAGNOSIS — R0602 Shortness of breath: Secondary | ICD-10-CM | POA: Diagnosis not present

## 2023-03-30 DIAGNOSIS — E782 Mixed hyperlipidemia: Secondary | ICD-10-CM | POA: Diagnosis not present

## 2023-03-30 DIAGNOSIS — I2510 Atherosclerotic heart disease of native coronary artery without angina pectoris: Secondary | ICD-10-CM | POA: Diagnosis not present

## 2023-03-30 DIAGNOSIS — I252 Old myocardial infarction: Secondary | ICD-10-CM | POA: Diagnosis not present

## 2023-03-30 DIAGNOSIS — Z955 Presence of coronary angioplasty implant and graft: Secondary | ICD-10-CM | POA: Diagnosis not present

## 2023-03-30 DIAGNOSIS — I209 Angina pectoris, unspecified: Secondary | ICD-10-CM | POA: Diagnosis not present

## 2023-03-30 DIAGNOSIS — M349 Systemic sclerosis, unspecified: Secondary | ICD-10-CM | POA: Diagnosis not present

## 2023-03-30 DIAGNOSIS — I10 Essential (primary) hypertension: Secondary | ICD-10-CM | POA: Diagnosis not present

## 2023-03-31 ENCOUNTER — Other Ambulatory Visit: Payer: Self-pay

## 2023-03-31 MED ORDER — ATORVASTATIN CALCIUM 40 MG PO TABS
40.0000 mg | ORAL_TABLET | Freq: Every day | ORAL | 3 refills | Status: DC
  Filled 2023-03-31: qty 90, 90d supply, fill #0

## 2023-03-31 MED ORDER — ATORVASTATIN CALCIUM 40 MG PO TABS
40.0000 mg | ORAL_TABLET | Freq: Every day | ORAL | 11 refills | Status: DC
  Filled 2023-03-31: qty 30, 30d supply, fill #0

## 2023-04-01 ENCOUNTER — Other Ambulatory Visit: Payer: Self-pay

## 2023-04-19 DIAGNOSIS — I2510 Atherosclerotic heart disease of native coronary artery without angina pectoris: Secondary | ICD-10-CM | POA: Diagnosis not present

## 2023-04-19 DIAGNOSIS — I25118 Atherosclerotic heart disease of native coronary artery with other forms of angina pectoris: Secondary | ICD-10-CM | POA: Diagnosis not present

## 2023-04-19 DIAGNOSIS — I2089 Other forms of angina pectoris: Secondary | ICD-10-CM | POA: Diagnosis not present

## 2023-04-19 DIAGNOSIS — R0602 Shortness of breath: Secondary | ICD-10-CM | POA: Diagnosis not present

## 2023-04-26 DIAGNOSIS — I10 Essential (primary) hypertension: Secondary | ICD-10-CM | POA: Diagnosis not present

## 2023-04-26 DIAGNOSIS — M349 Systemic sclerosis, unspecified: Secondary | ICD-10-CM | POA: Diagnosis not present

## 2023-04-26 DIAGNOSIS — Z955 Presence of coronary angioplasty implant and graft: Secondary | ICD-10-CM | POA: Diagnosis not present

## 2023-04-26 DIAGNOSIS — E7800 Pure hypercholesterolemia, unspecified: Secondary | ICD-10-CM | POA: Diagnosis not present

## 2023-04-26 DIAGNOSIS — M546 Pain in thoracic spine: Secondary | ICD-10-CM | POA: Diagnosis not present

## 2023-04-26 DIAGNOSIS — I2510 Atherosclerotic heart disease of native coronary artery without angina pectoris: Secondary | ICD-10-CM | POA: Diagnosis not present

## 2023-04-26 DIAGNOSIS — K219 Gastro-esophageal reflux disease without esophagitis: Secondary | ICD-10-CM | POA: Diagnosis not present

## 2023-05-09 ENCOUNTER — Telehealth: Payer: Self-pay | Admitting: Family Medicine

## 2023-05-09 NOTE — Telephone Encounter (Signed)
Informed Patient that the Disability parking placard has been completed & is ready for pick up. Copy left up front.

## 2023-06-08 ENCOUNTER — Encounter: Payer: Self-pay | Admitting: Emergency Medicine

## 2023-06-15 ENCOUNTER — Ambulatory Visit: Payer: Medicare HMO

## 2023-06-15 VITALS — Ht 60.0 in | Wt 121.0 lb

## 2023-06-15 DIAGNOSIS — Z0000 Encounter for general adult medical examination without abnormal findings: Secondary | ICD-10-CM

## 2023-06-15 DIAGNOSIS — Z1231 Encounter for screening mammogram for malignant neoplasm of breast: Secondary | ICD-10-CM

## 2023-06-15 NOTE — Patient Instructions (Addendum)
 [name] , Thank you for taking time to come for your Medicare Wellness Visit. I appreciate your ongoing commitment to your health goals. Please review the following plan we discussed and let me know if I can assist you in the future.   Referrals/Orders/Follow-Ups/Clinician Recommendations: Get a tetanus vaccine at your local pharmacy if covered by insurance. I have placed an order for a mammogram that is due 08/31/23. Call [facility] @ [phone] to schedule. I have made you an appointment with Dr. Sherrie Mustache for a physical on 07/13/23 @ 9:00am.  This is a list of the screening recommended for you and due dates:  Health Maintenance  Topic Date Due   DTaP/Tdap/Td vaccine (2 - Td or Tdap) 10/04/2020   COVID-19 Vaccine (4 - 2024-25 season) 12/12/2022   Mammogram  08/31/2023   Medicare Annual Wellness Visit  06/14/2024   Colon Cancer Screening  07/11/2024   DEXA scan (bone density measurement)  08/30/2024   Pneumonia Vaccine  Completed   Flu Shot  Completed   Hepatitis C Screening  Completed   Zoster (Shingles) Vaccine  Completed   HPV Vaccine  Aged Out    Advanced directives: (In Chart) A copy of your advanced directives are scanned into your chart should your provider ever need it.  Next Medicare Annual Wellness Visit scheduled for next year: Yes, 06/20/24 @ 11:30am (phone visit)

## 2023-06-15 NOTE — Progress Notes (Signed)
 Subjective:   Sara Huynh is a 73 y.o. who presents for a Medicare Wellness preventive visit.  Visit Complete: Virtual I connected with  Sara Huynh on 06/15/23 by a audio enabled telemedicine application and verified that I am speaking with the correct person using two identifiers.  Patient Location: Home  Provider Location: Home Office  I discussed the limitations of evaluation and management by telemedicine. The patient expressed understanding and agreed to proceed.  Vital Signs: Because this visit was a virtual/telehealth visit, some criteria may be missing or patient reported. Any vitals not documented were not able to be obtained and vitals that have been documented are patient reported.  VideoDeclined- This patient declined Librarian, academic. Therefore the visit was completed with audio only.  AWV Questionnaire: Yes: Patient Medicare AWV questionnaire was completed by the patient on 06/11/23; I have confirmed that all information answered by patient is correct and no changes since this date.  Cardiac Risk Factors include: advanced age (>65men, >46 women);hypertension;dyslipidemia     Objective:    Today's Vitals   06/15/23 1131  Weight: 121 lb (54.9 kg)  Height: 5' (1.524 m)   Body mass index is 23.63 kg/m.     06/15/2023   11:45 AM 01/16/2023    3:58 PM 01/15/2023   10:23 PM 06/30/2022    9:40 AM 06/10/2021   11:06 AM 10/30/2019    9:28 AM 10/18/2018   11:04 AM  Advanced Directives  Does Patient Have a Medical Advance Directive? Yes Yes Yes Yes No Yes Yes  Type of Estate agent of [place];Living will Healthcare Power of [employer] of [place];Living will Healthcare Power of [place];Living will  Healthcare Power of [place];Living will Healthcare Power of [place];Living will  Does patient want to make changes to medical advance directive? No - Patient declined No - Patient declined       Copy of  Healthcare Power of Attorney in Chart? Yes - validated most recent copy scanned in chart (See row information) No - copy requested    No - copy requested No - copy requested  Would patient like information on creating a medical advance directive?     No - Patient declined      Current Medications (verified) Outpatient Encounter Medications as of 06/15/2023  Medication Sig   acetaminophen (TYLENOL) 500 MG tablet Take 1,000 mg by mouth every 8 (eight) hours as needed.    amLODipine (NORVASC) 2.5 MG tablet TAKE 1 TABLET EVERY EVENING   aspirin EC 81 MG tablet Take 1 tablet (81 mg total) by mouth daily. Swallow whole.   atorvastatin (LIPITOR) 40 MG tablet Take 1 tablet (40 mg total) by mouth daily.   cholecalciferol (VITAMIN D3) 25 MCG (1000 UNIT) tablet Take 1,000 Units by mouth daily.   clopidogrel (PLAVIX) 75 MG tablet Take 75 mg by mouth daily.   escitalopram (LEXAPRO) 10 MG tablet Take 1 tablet (10 mg total) by mouth daily.   isosorbide mononitrate (IMDUR) 60 MG 24 hr tablet Take 60 mg by mouth daily.   lisinopril (ZESTRIL) 10 MG tablet Take 1 tablet (10 mg total) by mouth daily.   Multiple Vitamins-Minerals (MULTIVITAMIN WITH MINERALS) tablet Take 1 tablet by mouth daily.   omeprazole (PRILOSEC) 20 MG capsule Take 20 mg by mouth 2 (two) times daily before a meal.   ondansetron (ZOFRAN) 8 MG tablet Take 1 tablet by mouth as needed.   polyethylene glycol (MIRALAX / GLYCOLAX) packet Take 17  g by mouth daily as needed.   sucralfate (CARAFATE) 1 g tablet Take 1 g by mouth 4 (four) times daily.    aprepitant (EMEND) 80 MG capsule Take 1 capsule (80 mg total) by mouth daily. (Patient not taking: Reported on 06/15/2023)   atorvastatin (LIPITOR) 40 MG tablet Take 1 tablet (40 mg total) by mouth daily. (Patient not taking: Reported on 06/15/2023)   omeprazole-sodium bicarbonate (ZEGERID) 40-1100 MG capsule Take 1 capsule by mouth 2 (two) times daily.  (Patient not taking: Reported on 06/15/2023)    Prucalopride Succinate (MOTEGRITY) 2 MG TABS Take 1 tablet (2 mg total) by mouth daily. (Patient not taking: Reported on 06/15/2023)   RABEprazole Sodium 10 MG CPSP Take 2 capsules (20 mg total) by mouth 2 (two) times a day. (Patient not taking: Reported on 06/15/2023)   No facility-administered encounter medications on file as of 06/15/2023.    Allergies (verified) Alendronate, Dicyclomine, and Prednisone   History: Past Medical History:  Diagnosis Date   Allergy    Anxiety    Arthritis    GERD (gastroesophageal reflux disease)    Hyperlipidemia    Hypertension    Neuromuscular disorder (HCC)    NSTEMI (non-ST elevated myocardial infarction) (HCC) 01/16/2023   Osteoporosis    Shingles 12/18/2014   Ulcer    Past Surgical History:  Procedure Laterality Date   ABDOMINAL HYSTERECTOMY  1986   APPENDECTOMY     CHOLECYSTECTOMY  03/2009   CORONARY STENT INTERVENTION N/A 01/17/2023   Procedure: CORONARY STENT INTERVENTION;  Surgeon: Alwyn Pea, MD;  Location: [facility] INVASIVE CV LAB;  Service: Cardiovascular;  Laterality: N/A;   ESOPHAGEAL DILATION     EYE SURGERY     G tube placed for 18 mths   2007   gi pacemaker  2002   LEFT HEART CATH AND CORONARY ANGIOGRAPHY N/A 01/17/2023   Procedure: LEFT HEART CATH AND CORONARY ANGIOGRAPHY and possible PCI and stent;  Surgeon: Alwyn Pea, MD;  Location: [facility] INVASIVE CV LAB;  Service: Cardiovascular;  Laterality: N/A;   NASAL SINUS SURGERY  2005   OOPHORECTOMY     PYLOROPLASTY  07/15/2015   Family History  Problem Relation Age of Onset   CAD Mother    Heart disease Mother        MI   Dementia Mother    Alzheimer's disease Mother    CVA Father    COPD Father    Emphysema Father    Breast cancer Neg Hx    Social History   Socioeconomic History   Marital status: Widowed    Spouse name: Not on file   Number of children: 3   Years of education: Not on file   Highest education level: Some college, no degree  Occupational  History   Occupation: retired  Tobacco Use   Smoking status: Never   Smokeless tobacco: Never  Vaping Use   Vaping status: Never Used  Substance and Sexual Activity   Alcohol use: No   Drug use: No   Sexual activity: Not Currently  Other Topics Concern   Not on file  Social History Narrative   Not on file   Social Drivers of Health   Financial Resource Strain: Medium Risk (06/15/2023)   Overall Financial Resource Strain (CARDIA)    Difficulty of Paying Living Expenses: Somewhat hard  Food Insecurity: No Food Insecurity (06/15/2023)   Hunger Vital Sign    Worried About Running Out of Food in the Last Year: Never true  Ran Out of Food in the Last Year: Never true  Transportation Needs: No Transportation Needs (06/15/2023)   PRAPARE - Administrator, Civil Service (Medical): No    Lack of Transportation (Non-Medical): No  Physical Activity: Insufficiently Active (06/15/2023)   Exercise Vital Sign    Days of Exercise per Week: 5 days    Minutes of Exercise per Session: 20 min  Stress: No Stress Concern Present (06/15/2023)   [employer] of Occupational Health - Occupational Stress Questionnaire    Feeling of Stress : Not at all  Social Connections: Moderately Integrated (06/15/2023)   Social Connection and Isolation Panel [NHANES]    Frequency of Communication with Friends and Family: More than three times a week    Frequency of Social Gatherings with Friends and Family: Three times a week    Attends Religious Services: More than 4 times per year    Active Member of Clubs or Organizations: Yes    Attends Banker Meetings: More than 4 times per year    Marital Status: Widowed    Tobacco Counseling Counseling given: Not Answered    Clinical Intake:  Pre-visit preparation completed: Yes  Pain : No/denies pain     BMI - recorded: 23.63 Nutritional Status: BMI of 19-24  Normal Nutritional Risks: None Diabetes: No  How often do you need to  have someone help you when you read instructions, pamphlets, or other written materials from your doctor or pharmacy?: 1 - Never  Interpreter Needed?: No  Information entered by :: Tora Kindred, CMA   Activities of Daily Living     06/15/2023   11:33 AM 06/11/2023   10:26 AM  In your present state of health, do you have any difficulty performing the following activities:  Hearing? 0 0  Vision? 0 0  Difficulty concentrating or making decisions? 0 0  Walking or climbing stairs? 0 0  Dressing or bathing? 0 0  Doing errands, shopping? 0 0  Preparing Food and eating ? N N  Using the Toilet? N N  In the past six months, have you accidently leaked urine? N N  Do you have problems with loss of bowel control? N N  Managing your Medications? N N  Managing your Finances? N N  Housekeeping or managing your Housekeeping? N N    Patient Care Team: Malva Limes, MD as PCP - General (Family Medicine) Lockie Mola, MD as Referring Physician (Ophthalmology) Beverly Gust, MD as Referring Physician (Gastroenterology)  Indicate any recent Medical Services you may have received from other than [facility] providers in the past year (date may be approximate).     Assessment:   This is a routine wellness examination for Sara Huynh.  Hearing/Vision screen Hearing Screening - Comments:: Needs hearing aids, but can't afford Vision Screening - Comments:: Gets eye exams, [facility], [place]   Goals Addressed             This Visit's Progress    Patient Stated       Continue to exercise and get up to walking 3 miles       Depression Screen     06/15/2023   11:43 AM 06/30/2022    9:38 AM 06/10/2021   11:04 AM 01/21/2021    1:30 PM 06/17/2020   11:35 AM 10/30/2019    9:26 AM 10/18/2018   11:04 AM  PHQ 2/9 Scores  PHQ - 2 Score 0 0 0 0 0 0 0  PHQ- 9  Score 0    2      Fall Risk     06/15/2023   11:47 AM 06/11/2023   10:26 AM 06/29/2022   11:02 AM 06/10/2021   11:07 AM 01/21/2021     1:30 PM  Fall Risk   Falls in the past year? 0 0 0 0 0  Number falls in past yr: 0  0 0 0  Injury with Fall? 0  0 0 0  Risk for fall due to : No Fall Risks   No Fall Risks   Follow up Falls prevention discussed;Falls evaluation completed   Falls evaluation completed     MEDICARE RISK AT HOME:  Medicare Risk at Home Any stairs in or around the home?: Yes If so, are there any without handrails?: No Home free of loose throw rugs in walkways, pet beds, electrical cords, etc?: Yes Adequate lighting in your home to reduce risk of falls?: Yes Life alert?: No Use of a cane, walker or w/c?: No Grab bars in the bathroom?: No Shower chair or bench in shower?: No Elevated toilet seat or a handicapped toilet?: No  TIMED UP AND GO:  Was the test performed?  No  Cognitive Function: 6CIT completed        06/15/2023   11:47 AM 06/30/2022    9:41 AM 10/18/2018   11:07 AM 08/25/2016    9:45 AM  6CIT Screen  What Year? 0 points 0 points 0 points 0 points  What month? 0 points 0 points 0 points 0 points  What time? 0 points 0 points 0 points 0 points  Count back from 20 0 points 0 points 0 points 0 points  Months in reverse 0 points 0 points 0 points 0 points  Repeat phrase 0 points 0 points 0 points 4 points  Total Score 0 points 0 points 0 points 4 points    Immunizations Immunization History  Administered Date(s) Administered   Fluad Quad(high Dose 65+) 01/01/2019, 01/23/2020, 01/13/2021, 01/22/2022   Influenza, High Dose Seasonal PF 01/03/2016, 01/08/2017, 01/28/2018, 12/24/2022   Influenza,inj,Quad PF,6+ Mos 01/11/2015   PFIZER Comirnaty(Gray Top)Covid-19 Tri-Sucrose Vaccine 05/23/2020   PFIZER(Purple Top)SARS-COV-2 Vaccination 05/25/2019, 06/19/2019   Pneumococcal Conjugate-13 01/03/2016   Pneumococcal Polysaccharide-23 01/08/2017   Tdap 10/05/2010   Zoster Recombinant(Shingrix) 05/19/2021, 07/21/2021    Screening Tests Health Maintenance  Topic Date Due   DTaP/Tdap/Td (2 - Td  or Tdap) 10/04/2020   COVID-19 Vaccine (4 - 2024-25 season) 12/12/2022   MAMMOGRAM  08/31/2023   Medicare Annual Wellness (AWV)  06/14/2024   Colonoscopy  07/11/2024   DEXA SCAN  08/30/2024   Pneumonia Vaccine 37+ Years old  Completed   INFLUENZA VACCINE  Completed   Hepatitis C Screening  Completed   Zoster Vaccines- Shingrix  Completed   HPV VACCINES  Aged Out    Health Maintenance  Health Maintenance Due  Topic Date Due   DTaP/Tdap/Td (2 - Td or Tdap) 10/04/2020   COVID-19 Vaccine (4 - 2024-25 season) 12/12/2022   Health Maintenance Items Addressed: Mammogram ordered, See Nurse Notes  Additional Screening:  Vision Screening: Recommended annual ophthalmology exams for early detection of glaucoma and other disorders of the eye.  Dental Screening: Recommended annual dental exams for proper oral hygiene  Community Resource Referral / Chronic Care Management: CRR required this visit?  No   CCM required this visit?  No     Plan:     I have personally reviewed and noted the following in the  patient's chart:   Medical and social history Use of alcohol, tobacco or illicit drugs  Current medications and supplements including opioid prescriptions. Patient is not currently taking opioid prescriptions. Functional ability and status Nutritional status Physical activity Advanced directives List of other physicians Hospitalizations, surgeries, and ER visits in previous 12 months Vitals Screenings to include cognitive, depression, and falls Referrals and appointments  In addition, I have reviewed and discussed with patient certain preventive protocols, quality metrics, and best practice recommendations. A written personalized care plan for preventive services as well as general preventive health recommendations were provided to patient.     Tora Kindred, CMA   06/15/2023   After Visit Summary: (MyChart) Due to this being a telephonic visit, the after visit summary with  patients personalized plan was offered to patient via MyChart   Notes:  Needs Tdap Declined Covid Placed order for [facility] due 08/31/23 Scheduled appt for physical for 07/13/23

## 2023-07-04 ENCOUNTER — Other Ambulatory Visit: Payer: Self-pay | Admitting: Family Medicine

## 2023-07-04 DIAGNOSIS — I10 Essential (primary) hypertension: Secondary | ICD-10-CM

## 2023-07-04 DIAGNOSIS — F439 Reaction to severe stress, unspecified: Secondary | ICD-10-CM

## 2023-07-13 ENCOUNTER — Ambulatory Visit (INDEPENDENT_AMBULATORY_CARE_PROVIDER_SITE_OTHER): Admitting: Family Medicine

## 2023-07-13 ENCOUNTER — Telehealth: Payer: Self-pay | Admitting: Family Medicine

## 2023-07-13 VITALS — BP 132/79 | HR 67 | Temp 98.1°F | Ht 60.0 in | Wt 124.0 lb

## 2023-07-13 DIAGNOSIS — I10 Essential (primary) hypertension: Secondary | ICD-10-CM | POA: Diagnosis not present

## 2023-07-13 DIAGNOSIS — I25118 Atherosclerotic heart disease of native coronary artery with other forms of angina pectoris: Secondary | ICD-10-CM | POA: Insufficient documentation

## 2023-07-13 DIAGNOSIS — M810 Age-related osteoporosis without current pathological fracture: Secondary | ICD-10-CM

## 2023-07-13 DIAGNOSIS — E559 Vitamin D deficiency, unspecified: Secondary | ICD-10-CM

## 2023-07-13 DIAGNOSIS — L739 Follicular disorder, unspecified: Secondary | ICD-10-CM | POA: Diagnosis not present

## 2023-07-13 DIAGNOSIS — M349 Systemic sclerosis, unspecified: Secondary | ICD-10-CM

## 2023-07-13 DIAGNOSIS — Z8719 Personal history of other diseases of the digestive system: Secondary | ICD-10-CM | POA: Diagnosis not present

## 2023-07-13 DIAGNOSIS — Z0001 Encounter for general adult medical examination with abnormal findings: Secondary | ICD-10-CM | POA: Diagnosis not present

## 2023-07-13 DIAGNOSIS — Z0000 Encounter for general adult medical examination without abnormal findings: Secondary | ICD-10-CM

## 2023-07-13 DIAGNOSIS — E7800 Pure hypercholesterolemia, unspecified: Secondary | ICD-10-CM

## 2023-07-13 DIAGNOSIS — K219 Gastro-esophageal reflux disease without esophagitis: Secondary | ICD-10-CM

## 2023-07-13 DIAGNOSIS — K3184 Gastroparesis: Secondary | ICD-10-CM

## 2023-07-13 MED ORDER — MUPIROCIN 2 % EX OINT: 1.0000 | TOPICAL_OINTMENT | Freq: Two times a day (BID) | CUTANEOUS | 0 refills | Status: AC

## 2023-07-13 MED ORDER — MUPIROCIN CALCIUM 2 % EX CREA: 1.0000 | TOPICAL_CREAM | Freq: Two times a day (BID) | CUTANEOUS | 0 refills | Status: DC

## 2023-07-13 MED ORDER — OMEPRAZOLE 20 MG PO CPDR: 20.0000 mg | DELAYED_RELEASE_CAPSULE | Freq: Two times a day (BID) | ORAL | 3 refills | Status: DC

## 2023-07-13 NOTE — Progress Notes (Signed)
 Complete physical exam   Patient: Sara Huynh   DOB: June 05, 1950   73 y.o. Female  MRN: [id number] Visit Date: 07/13/2023  Today's healthcare provider: Mila Merry, MD   Chief Complaint  Patient presents with   Annual Exam   Hypertension    Patient reports having elevate blood pressure readings for 4 days.  Her readings have ranged in systolic readings of 162-200 and diastolic readings 70-90.  She reports she talked with her cardiologist and he increased her Amlodipine to 5 mg daily and Lisinopril to 20 mg daily.  She has had 1 dose of each at the increased strength. Amlodipine last night and Lisinopril this morning.   Axillary nodule    Patient noticed a nodule under her right armpit about 1 week ago.  At first it was red and painful.  Now it is still a little red and not painful.  She said it may have gone down a little in size.     Subjective    Discussed the use of AI scribe software for clinical note transcription with the patient, who gave verbal consent to proceed.  History of Present Illness   Sara Huynh is a 73 year old female with hypertension and coronary artery disease who presents for an annual physical exam.  She has a history of hypertension and reports elevated blood pressure since Sunday. Her medication doses of amlodipine and lisinopril were doubled by Dr. Juliann Pares. She has taken two doses of amlodipine and one dose of lisinopril since the adjustment. She is concerned about her blood pressure due to a previous heart attack in October, during which she experienced back pain as a symptom. Her blood pressure started rising again on Sunday afternoon.  She has a history of coronary artery disease, having had a heart attack in October with a stent placed in a 90% blocked artery. Two arteries remain 50% blocked. Her cholesterol medication was changed to atorvastatin following the heart attack.  She experiences acid reflux and is currently taking sucralfate and  omeprazole, though these are not effective. She had been prescribed Zegrid in the past which worked much better, but was not covered by Community education officer. She also has gastropareses and scleroderma, reporting that her previous gastroenterologist retired, and she has had difficulty finding a new provider. She was referred to a doctor in [place] but finds the distance too far to travel.  She reports a new lump on her skin under her right armpit, which appeared last week and is not sore. She has not yet scheduled a mammogram, as it cannot be scheduled until after May 21st.         Past Medical History:  Diagnosis Date   Allergy    Anxiety    Arthritis    GERD (gastroesophageal reflux disease)    Hyperlipidemia    Hypertension    Neuromuscular disorder (HCC)    NSTEMI (non-ST elevated myocardial infarction) (HCC) 01/16/2023   Osteoporosis    Shingles 12/18/2014   Ulcer    Past Surgical History:  Procedure Laterality Date   ABDOMINAL HYSTERECTOMY  1986   APPENDECTOMY     CHOLECYSTECTOMY  03/2009   CORONARY STENT INTERVENTION N/A 01/17/2023   Procedure: CORONARY STENT INTERVENTION;  Surgeon: Alwyn Pea, MD;  Location: [facility] INVASIVE CV [facility];  Service: Cardiovascular;  Laterality: N/A;   ESOPHAGEAL DILATION     EYE SURGERY     G tube placed for 18 mths   2007   gi  pacemaker  2002   LEFT HEART CATH AND CORONARY ANGIOGRAPHY N/A 01/17/2023   Procedure: LEFT HEART CATH AND CORONARY ANGIOGRAPHY and possible PCI and stent;  Surgeon: Alwyn Pea, MD;  Location: [facility] INVASIVE CV LAB;  Service: Cardiovascular;  Laterality: N/A;   NASAL SINUS SURGERY  2005   OOPHORECTOMY     PYLOROPLASTY  07/15/2015   Social History   Socioeconomic History   Marital status: Widowed    Spouse name: Not on file   Number of children: 3   Years of education: Not on file   Highest education level: Some college, no degree  Occupational History   Occupation: retired  Tobacco Use   Smoking status: Never    Smokeless tobacco: Never  Vaping Use   Vaping status: Never Used  Substance and Sexual Activity   Alcohol use: No   Drug use: No   Sexual activity: Not Currently  Other Topics Concern   Not on file  Social History Narrative   Not on file   Social Drivers of Health   Financial Resource Strain: Medium Risk (06/15/2023)   Overall Financial Resource Strain (CARDIA)    Difficulty of Paying Living Expenses: Somewhat hard  Food Insecurity: No Food Insecurity (06/15/2023)   Hunger Vital Sign    Worried About Running Out of Food in the Last Year: Never true    Ran Out of Food in the Last Year: Never true  Transportation Needs: No Transportation Needs (06/15/2023)   PRAPARE - Administrator, Civil Service (Medical): No    Lack of Transportation (Non-Medical): No  Physical Activity: Insufficiently Active (06/15/2023)   Exercise Vital Sign    Days of Exercise per Week: 5 days    Minutes of Exercise per Session: 20 min  Stress: No Stress Concern Present (06/15/2023)   [employer] of Occupational Health - Occupational Stress Questionnaire    Feeling of Stress : Not at all  Social Connections: Moderately Integrated (06/15/2023)   Social Connection and Isolation Panel [NHANES]    Frequency of Communication with Friends and Family: More than three times a week    Frequency of Social Gatherings with Friends and Family: Three times a week    Attends Religious Services: More than 4 times per year    Active Member of Clubs or Organizations: Yes    Attends Banker Meetings: More than 4 times per year    Marital Status: Widowed  Intimate Partner Violence: Not At Risk (06/15/2023)   Humiliation, Afraid, Rape, and Kick questionnaire    Fear of Current or Ex-Partner: No    Emotionally Abused: No    Physically Abused: No    Sexually Abused: No   Family Status  Relation Name Status   Mother Patty Bowens Deceased   Father Luane School Deceased   Brother  Alive   Brother   Alive   Neg Hx  (Not Specified)  No partnership data on file   Family History  Problem Relation Age of Onset   CAD Mother    Heart disease Mother        MI   Dementia Mother    Alzheimer's disease Mother    CVA Father    COPD Father    Emphysema Father    Breast cancer Neg Hx    Allergies  Allergen Reactions   Alendronate     Worsening reflux with history of scleroderma   Dicyclomine Other (See Comments)    Double vision  Prednisone Itching, Other (See Comments) and Rash    Other reaction(s): Other (See Comments) Makes skin crawl and make pt aggitated Makes skin crawl and make pt aggitated Makes skin crawl and make pt aggitated     Patient Care Team: Malva Limes, MD as PCP - General (Family Medicine) Pa, [facility] (Optometry) [place], Bobbie Stack, MD as Consulting Physician (Cardiology)   Medications: Outpatient Medications Prior to Visit  Medication Sig Note   acetaminophen (TYLENOL) 500 MG tablet Take 1,000 mg by mouth every 8 (eight) hours as needed.     amLODipine (NORVASC) 2.5 MG tablet TAKE 1 TABLET EVERY EVENING (Patient taking differently: Take 5 mg by mouth every evening.) 07/13/2023: Patient taking 5 mg per cardiology 07/13/23   aspirin EC 81 MG tablet Take 1 tablet (81 mg total) by mouth daily. Swallow whole.    atorvastatin (LIPITOR) 40 MG tablet Take 1 tablet (40 mg total) by mouth daily. (Patient not taking: Reported on 06/15/2023)    atorvastatin (LIPITOR) 40 MG tablet Take 1 tablet (40 mg total) by mouth daily.    cholecalciferol (VITAMIN D3) 25 MCG (1000 UNIT) tablet Take 1,000 Units by mouth daily.    clopidogrel (PLAVIX) 75 MG tablet Take 75 mg by mouth daily.    escitalopram (LEXAPRO) 10 MG tablet TAKE 1 TABLET EVERY DAY    isosorbide mononitrate (IMDUR) 60 MG 24 hr tablet Take 60 mg by mouth daily.    lisinopril (ZESTRIL) 10 MG tablet TAKE 1 TABLET EVERY DAY (Patient taking differently: Take 20 mg by mouth daily.) 07/13/2023: Patient taking 20 mg  per cardiology 07/13/23   Multiple Vitamins-Minerals (MULTIVITAMIN WITH MINERALS) tablet Take 1 tablet by mouth daily.    omeprazole-sodium bicarbonate (ZEGERID) 40-1100 MG capsule Take 1 capsule by mouth 2 (two) times daily.  (Patient not taking: Reported on 06/15/2023)    ondansetron (ZOFRAN) 8 MG tablet Take 1 tablet by mouth as needed.    polyethylene glycol (MIRALAX / GLYCOLAX) packet Take 17 g by mouth daily as needed.    sucralfate (CARAFATE) 1 g tablet Take 1 g by mouth 4 (four) times daily.     [DISCONTINUED] aprepitant (EMEND) 80 MG capsule Take 1 capsule (80 mg total) by mouth daily. (Patient not taking: Reported on 06/15/2023)    [DISCONTINUED] omeprazole (PRILOSEC) 20 MG capsule Take 20 mg by mouth 2 (two) times daily before a meal.    [DISCONTINUED] Prucalopride Succinate (MOTEGRITY) 2 MG TABS Take 1 tablet (2 mg total) by mouth daily. (Patient not taking: Reported on 06/15/2023)    [DISCONTINUED] RABEprazole Sodium 10 MG CPSP Take 2 capsules (20 mg total) by mouth 2 (two) times a day. (Patient not taking: Reported on 06/15/2023)    No facility-administered medications prior to visit.    Review of Systems    Objective    BP 132/79 (BP Location: Left Arm, Patient Position: Sitting, Cuff Size: Normal)   Pulse 67   Temp 98.1 F (36.7 C) (Oral)   Ht 5' (1.524 m)   Wt 124 lb (56.2 kg)   SpO2 99%   BMI 24.22 kg/m    Physical Exam  General Appearance:    Well developed, well nourished female. Alert, cooperative, in no acute distress, appears stated age   Head:    Normocephalic, without obvious abnormality, atraumatic  Eyes:    PERRL, conjunctiva/corneas clear, EOM's intact, fundi    benign, both eyes  Ears:    Normal TM's and external ear canals, both ears  Nose:   Nares normal, septum midline, mucosa normal, no drainage    or sinus tenderness  Throat:   Lips, mucosa, and tongue normal; teeth and gums normal  Neck:   Supple, symmetrical, trachea midline, no adenopathy;     thyroid:  no enlargement/tenderness/nodules; no carotid   bruit or JVD  Back:     Symmetric, no curvature, ROM normal, no CVA tenderness  Lungs:     Clear to auscultation bilaterally, respirations unlabored  Chest Wall:    No tenderness or deformity   Heart:    Normal heart rate. Normal rhythm. No murmurs, rubs, or gallops.   Breast Exam:    deferred  Abdomen:     Soft, non-tender, bowel sounds active all four quadrants,    no masses, no organomegaly  Pelvic:    deferred  Extremities:   All extremities are intact. No cyanosis or edema  Pulses:   2+ and symmetric all extremities  Skin:   Slightly inflamed but non tender enlarged follicular lesion of right axillae.   Lymph nodes:   Cervical, supraclavicular, and axillary nodes normal  Neurologic:   CNII-XII intact, normal strength, sensation and reflexes    throughout       Last depression screening scores    06/15/2023   11:43 AM 06/30/2022    9:38 AM 06/10/2021   11:04 AM  PHQ 2/9 Scores  PHQ - 2 Score 0 0 0  PHQ- 9 Score 0     Last fall risk screening    06/15/2023   11:47 AM  Fall Risk   Falls in the past year? 0  Number falls in past yr: 0  Injury with Fall? 0  Risk for fall due to : No Fall Risks  Follow up Falls prevention discussed;Falls evaluation completed   Last Audit-C alcohol use screening    06/15/2023   11:42 AM  Alcohol Use Disorder Test (AUDIT)  1. How often do you have a drink containing alcohol? 0  2. How many drinks containing alcohol do you have on a typical day when you are drinking? 0  3. How often do you have six or more drinks on one occasion? 0  AUDIT-C Score 0   A score of 3 or more in women, and 4 or more in men indicates increased risk for alcohol abuse, EXCEPT if all of the points are from question 1     Assessment & Plan    Routine Health Maintenance and Physical Exam  Exercise Activities and Dietary recommendations  Goals      Patient Stated     Continue to exercise and get up to  walking 3 miles        Immunization History  Administered Date(s) Administered   Fluad Quad(high Dose 65+) 01/01/2019, 01/23/2020, 01/13/2021, 01/22/2022   Influenza, High Dose Seasonal PF 01/03/2016, 01/08/2017, 01/28/2018, 12/24/2022   Influenza,inj,Quad PF,6+ Mos 01/11/2015   PFIZER Comirnaty(Gray Top)Covid-19 Tri-Sucrose Vaccine 05/23/2020   PFIZER(Purple Top)SARS-COV-2 Vaccination 05/25/2019, 06/19/2019   Pneumococcal Conjugate-13 01/03/2016   Pneumococcal Polysaccharide-23 01/08/2017   Tdap 10/05/2010   Zoster Recombinant(Shingrix) 05/19/2021, 07/21/2021    Health Maintenance  Topic Date Due   DTaP/Tdap/Td (2 - Td or Tdap) 10/04/2020   COVID-19 Vaccine (4 - 2024-25 season) 12/12/2022   MAMMOGRAM  08/31/2023   INFLUENZA VACCINE  11/11/2023   Medicare Annual Wellness (AWV)  06/14/2024   Colonoscopy  07/11/2024   DEXA SCAN  08/30/2024   Pneumonia Vaccine 54+ Years old  Completed  Hepatitis C Screening  Completed   Zoster Vaccines- Shingrix  Completed   HPV VACCINES  Aged Out    Discussed health benefits of physical activity, and encouraged her to engage in regular exercise appropriate for her age and condition.     Hypertension Blood pressure elevated post-myocardial infarction. Medication doses increased for better control. - Continue amlodipine and lisinopril regimen. - Monitor blood pressure closely. - Follow up if no improvement.  Coronary Artery Disease Post-myocardial infarction with stent placement. Two arteries remain 50% occluded. On atorvastatin for cholesterol management.  Gastroesophageal Reflux Disease (GERD) Reflux persists despite sucralfate and omeprazole. Prefers Zegerid, not covered by insurance. Seeking new gastroenterologist. - Send referral to [facility] for gastroenterology consultation. - Consider alternative medications if omeprazole remains ineffective. - Discuss insurance coverage for preferred medications.   Folliculitis of right axillae.   Small cyst likely folliculitis, not sore but risk of infection present. - Prescribe Bactroban cream to prevent infection. - Advise against using deodorant on affected area until healed.  General Health Maintenance Discussed RSV vaccine for individuals over 65 to prevent respiratory complications. - Recommend RSV vaccine at a pharmacy.         Mila Merry, MD  [facility] [phone] (phone) [phone] (fax)  [facility]

## 2023-07-13 NOTE — Telephone Encounter (Signed)
 Copied from CRM [id number]. Topic: Clinical - Prescription Issue >> Jul 13, 2023 11:41 AM Antwanette L wrote: Reason for CRM: Morrie Sheldon from [facility] Benefits Department is calling to see if Dr. Sherrie Mustache can send in an alternative medicine for mupirocin cream (BACTROBAN) 2 %. [employer] will not cover this prescription. Morrie Sheldon said they will cover mupirocin ointment. I asked for a callback number and the representative did not have one but provided a reference number [id number]

## 2023-07-13 NOTE — Patient Instructions (Signed)
 Marland Kitchen  Please review the attached list of medications and notify my office if there are any errors.   . Please bring all of your medications to every appointment so we can make sure that our medication list is the same as yours.

## 2023-07-13 NOTE — Addendum Note (Signed)
 Addended by: Malva Limes on: 07/13/2023 02:38 PM   Modules accepted: Orders

## 2023-07-14 ENCOUNTER — Encounter: Payer: Self-pay | Admitting: Family Medicine

## 2023-07-14 ENCOUNTER — Other Ambulatory Visit: Payer: Self-pay | Admitting: Family Medicine

## 2023-07-14 DIAGNOSIS — E7800 Pure hypercholesterolemia, unspecified: Secondary | ICD-10-CM

## 2023-07-14 LAB — COMPREHENSIVE METABOLIC PANEL WITH GFR
ALT: 16 IU/L (ref 0–32)
AST: 22 IU/L (ref 0–40)
Albumin: 4.2 g/dL (ref 3.8–4.8)
Alkaline Phosphatase: 66 IU/L (ref 44–121)
BUN/Creatinine Ratio: 9 — ABNORMAL LOW (ref 12–28)
BUN: 9 mg/dL (ref 8–27)
Bilirubin Total: 0.6 mg/dL (ref 0.0–1.2)
CO2: 23 mmol/L (ref 20–29)
Calcium: 9.9 mg/dL (ref 8.7–10.3)
Chloride: 103 mmol/L (ref 96–106)
Creatinine, Ser: 1.04 mg/dL — ABNORMAL HIGH (ref 0.57–1.00)
Globulin, Total: 2.4 g/dL (ref 1.5–4.5)
Glucose: 87 mg/dL (ref 70–99)
Potassium: 5 mmol/L (ref 3.5–5.2)
Sodium: 141 mmol/L (ref 134–144)
Total Protein: 6.6 g/dL (ref 6.0–8.5)
eGFR: 57 mL/min/{1.73_m2} — ABNORMAL LOW (ref >59)

## 2023-07-14 LAB — LIPID PANEL
Chol/HDL Ratio: 4.2 ratio (ref 0.0–4.4)
Cholesterol, Total: 200 mg/dL — ABNORMAL HIGH (ref 100–199)
HDL: 48 mg/dL (ref >39)
LDL Chol Calc (NIH): 94 mg/dL (ref 0–99)
Triglycerides: 350 mg/dL — ABNORMAL HIGH (ref 0–149)
VLDL Cholesterol Cal: 58 mg/dL — ABNORMAL HIGH (ref 5–40)

## 2023-07-14 LAB — CBC
Hematocrit: 37.7 % (ref 34.0–46.6)
Hemoglobin: 11.9 g/dL (ref 11.1–15.9)
MCH: 27.9 pg (ref 26.6–33.0)
MCHC: 31.6 g/dL (ref 31.5–35.7)
MCV: 89 fL (ref 79–97)
Platelets: 259 10*3/uL (ref 150–450)
RBC: 4.26 x10E6/uL (ref 3.77–5.28)
RDW: 14.3 % (ref 11.7–15.4)
WBC: 8.5 10*3/uL (ref 3.4–10.8)

## 2023-07-14 LAB — VITAMIN D 25 HYDROXY (VIT D DEFICIENCY, FRACTURES): Vit D, 25-Hydroxy: 37.7 ng/mL (ref 30.0–100.0)

## 2023-07-14 MED ORDER — ATORVASTATIN CALCIUM 80 MG PO TABS: 80.0000 mg | ORAL_TABLET | Freq: Every day | ORAL | 1 refills | Status: AC

## 2023-07-21 ENCOUNTER — Telehealth: Payer: Self-pay

## 2023-07-21 NOTE — Telephone Encounter (Signed)
 Copied from CRM [id number]. Topic: General - Other >> Jul 21, 2023  1:37 PM Albin Felling L wrote: Reason for CRM: Sara Huynh w/ [facility] sates patient has Chronic condition benefits plan and calling to verify if member is a patient. Also calling to verify if patient has a specific chronic conditions: cardiovascular disease & diabetes.   Best call back number: [phone] Reference #: [id number]  Also requested fax number to office. Provided fax number: [phone]

## 2023-07-29 ENCOUNTER — Telehealth: Payer: Self-pay

## 2023-07-29 NOTE — Telephone Encounter (Signed)
 Copied from CRM [id number]. Topic: General - Other >> Jul 29, 2023 10:40 AM Marissa P wrote: Reason for CRM: Patient called in stating that PCP advised that if she does not hear back regarding referral to let us  know. She is calling in regards to referral needed for an gastroenterologist, would like someone to follow back up with her please

## 2023-08-02 NOTE — Telephone Encounter (Signed)
 LMTCB

## 2023-08-03 NOTE — Telephone Encounter (Signed)
 Patient aware the [facility] received referral but she is on a waiting list and they will be getting in contact with her.

## 2023-08-22 ENCOUNTER — Telehealth: Payer: Self-pay | Admitting: Family Medicine

## 2023-08-22 MED ORDER — OMEPRAZOLE 20 MG PO CPDR: 20.0000 mg | DELAYED_RELEASE_CAPSULE | Freq: Two times a day (BID) | ORAL | 3 refills | Status: AC

## 2023-08-22 NOTE — Telephone Encounter (Signed)
[facility] faxed refill request for the following medications:   omeprazole (PRILOSEC) 20 MG capsule    Please advise

## 2023-08-22 NOTE — Telephone Encounter (Signed)
 Sent in

## 2023-09-02 ENCOUNTER — Encounter

## 2023-10-12 ENCOUNTER — Ambulatory Visit: Admitting: Family Medicine

## 2023-10-19 ENCOUNTER — Ambulatory Visit
Admission: RE | Admit: 2023-10-19 | Discharge: 2023-10-19 | Disposition: A | Discharge status: Home / Self Care | Source: Ambulatory Visit | Attending: Family Medicine | Admitting: Family Medicine

## 2023-10-19 DIAGNOSIS — Z1231 Encounter for screening mammogram for malignant neoplasm of breast: Secondary | ICD-10-CM | POA: Diagnosis not present

## 2023-10-28 DIAGNOSIS — H25013 Cortical age-related cataract, bilateral: Secondary | ICD-10-CM | POA: Diagnosis not present

## 2023-10-28 DIAGNOSIS — H2513 Age-related nuclear cataract, bilateral: Secondary | ICD-10-CM | POA: Diagnosis not present

## 2023-10-28 DIAGNOSIS — H5000 Unspecified esotropia: Secondary | ICD-10-CM | POA: Diagnosis not present

## 2023-11-07 DIAGNOSIS — E782 Mixed hyperlipidemia: Secondary | ICD-10-CM | POA: Diagnosis not present

## 2023-11-07 DIAGNOSIS — I252 Old myocardial infarction: Secondary | ICD-10-CM | POA: Diagnosis not present

## 2023-11-07 DIAGNOSIS — I2510 Atherosclerotic heart disease of native coronary artery without angina pectoris: Secondary | ICD-10-CM | POA: Diagnosis not present

## 2023-11-07 DIAGNOSIS — Z955 Presence of coronary angioplasty implant and graft: Secondary | ICD-10-CM | POA: Diagnosis not present

## 2023-11-07 DIAGNOSIS — K219 Gastro-esophageal reflux disease without esophagitis: Secondary | ICD-10-CM | POA: Diagnosis not present

## 2023-11-07 DIAGNOSIS — I10 Essential (primary) hypertension: Secondary | ICD-10-CM | POA: Diagnosis not present

## 2023-11-07 DIAGNOSIS — R079 Chest pain, unspecified: Secondary | ICD-10-CM | POA: Diagnosis not present

## 2023-11-10 DIAGNOSIS — R079 Chest pain, unspecified: Secondary | ICD-10-CM | POA: Diagnosis not present

## 2023-11-10 DIAGNOSIS — I2510 Atherosclerotic heart disease of native coronary artery without angina pectoris: Secondary | ICD-10-CM | POA: Diagnosis not present

## 2023-11-10 DIAGNOSIS — Z01812 Encounter for preprocedural laboratory examination: Secondary | ICD-10-CM | POA: Diagnosis not present

## 2023-11-11 ENCOUNTER — Encounter
Admission: RE | Disposition: A | Discharge status: Home / Self Care | Payer: Self-pay | Source: Home / Self Care | Attending: Internal Medicine

## 2023-11-11 ENCOUNTER — Other Ambulatory Visit: Payer: Self-pay

## 2023-11-11 ENCOUNTER — Ambulatory Visit
Admission: RE | Admit: 2023-11-11 | Discharge: 2023-11-11 | Disposition: A | Discharge status: Home / Self Care | Attending: Internal Medicine | Admitting: Internal Medicine

## 2023-11-11 ENCOUNTER — Ambulatory Visit

## 2023-11-11 ENCOUNTER — Encounter: Payer: Self-pay | Admitting: Internal Medicine

## 2023-11-11 DIAGNOSIS — K2289 Other specified disease of esophagus: Secondary | ICD-10-CM | POA: Diagnosis not present

## 2023-11-11 DIAGNOSIS — I2584 Coronary atherosclerosis due to calcified coronary lesion: Secondary | ICD-10-CM | POA: Diagnosis not present

## 2023-11-11 DIAGNOSIS — Z7902 Long term (current) use of antithrombotics/antiplatelets: Secondary | ICD-10-CM | POA: Diagnosis not present

## 2023-11-11 DIAGNOSIS — I200 Unstable angina: Secondary | ICD-10-CM | POA: Diagnosis not present

## 2023-11-11 DIAGNOSIS — I25110 Atherosclerotic heart disease of native coronary artery with unstable angina pectoris: Secondary | ICD-10-CM | POA: Insufficient documentation

## 2023-11-11 DIAGNOSIS — Z955 Presence of coronary angioplasty implant and graft: Secondary | ICD-10-CM | POA: Diagnosis not present

## 2023-11-11 DIAGNOSIS — R0600 Dyspnea, unspecified: Secondary | ICD-10-CM | POA: Diagnosis not present

## 2023-11-11 DIAGNOSIS — I700 Atherosclerosis of aorta: Secondary | ICD-10-CM | POA: Insufficient documentation

## 2023-11-11 DIAGNOSIS — Z7982 Long term (current) use of aspirin: Secondary | ICD-10-CM | POA: Diagnosis not present

## 2023-11-11 DIAGNOSIS — I2510 Atherosclerotic heart disease of native coronary artery without angina pectoris: Secondary | ICD-10-CM | POA: Diagnosis not present

## 2023-11-11 HISTORY — PX: LEFT HEART CATH AND CORONARY ANGIOGRAPHY: CATH118249

## 2023-11-11 SURGERY — LEFT HEART CATH AND CORONARY ANGIOGRAPHY
Anesthesia: Moderate Sedation | Laterality: Left

## 2023-11-11 MED ORDER — SODIUM CHLORIDE 0.9% FLUSH: 3.0000 mL | INTRAVENOUS | Status: DC | PRN

## 2023-11-11 MED ORDER — VERAPAMIL HCL 2.5 MG/ML IV SOLN
INTRAVENOUS | Status: AC
  Filled 2023-11-11: qty 2

## 2023-11-11 MED ORDER — SODIUM CHLORIDE 0.9 % IV SOLN
250.0000 mL | INTRAVENOUS | Status: DC | PRN
  Administered 2023-11-11: 250 mL via INTRAVENOUS

## 2023-11-11 MED ORDER — VERAPAMIL HCL 2.5 MG/ML IV SOLN
INTRAVENOUS | Status: DC | PRN
  Administered 2023-11-11: 2.5 mg via INTRA_ARTERIAL

## 2023-11-11 MED ORDER — IOHEXOL 350 MG/ML SOLN
75.0000 mL | Freq: Once | INTRAVENOUS | Status: AC | PRN
  Administered 2023-11-11: 75 mL via INTRAVENOUS

## 2023-11-11 MED ORDER — IOHEXOL 300 MG/ML  SOLN
INTRAMUSCULAR | Status: DC | PRN
Start: 2023-11-11 — End: 2023-11-11
  Administered 2023-11-11: 110 mL

## 2023-11-11 MED ORDER — LIDOCAINE HCL (PF) 1 % IJ SOLN
INTRAMUSCULAR | Status: DC | PRN
  Administered 2023-11-11: 5 mL

## 2023-11-11 MED ORDER — FREE WATER: 500.0000 mL | Freq: Once | Status: DC

## 2023-11-11 MED ORDER — HEPARIN SODIUM (PORCINE) 1000 UNIT/ML IJ SOLN
INTRAMUSCULAR | Status: AC
  Filled 2023-11-11: qty 10

## 2023-11-11 MED ORDER — SODIUM CHLORIDE 0.9% FLUSH: 3.0000 mL | Freq: Two times a day (BID) | INTRAVENOUS | Status: DC

## 2023-11-11 MED ORDER — ASPIRIN 81 MG PO CHEW
CHEWABLE_TABLET | ORAL | Status: AC
  Filled 2023-11-11: qty 1

## 2023-11-11 MED ORDER — SODIUM CHLORIDE 0.9 % IV SOLN
250.0000 mL | INTRAVENOUS | Status: DC | PRN
Start: 2023-11-11 — End: 2023-11-11

## 2023-11-11 MED ORDER — FENTANYL CITRATE (PF) 100 MCG/2ML IJ SOLN
INTRAMUSCULAR | Status: AC
  Filled 2023-11-11: qty 2

## 2023-11-11 MED ORDER — FREE WATER
500.0000 mL | Freq: Once | Status: AC
  Administered 2023-11-11: 237 mL via ORAL

## 2023-11-11 MED ORDER — MIDAZOLAM HCL 2 MG/2ML IJ SOLN
INTRAMUSCULAR | Status: DC | PRN
  Administered 2023-11-11: 1 mg via INTRAVENOUS

## 2023-11-11 MED ORDER — HYDRALAZINE HCL 20 MG/ML IJ SOLN: 10.0000 mg | INTRAMUSCULAR | Status: DC | PRN

## 2023-11-11 MED ORDER — ONDANSETRON HCL 4 MG/2ML IJ SOLN: 4.0000 mg | Freq: Four times a day (QID) | INTRAMUSCULAR | Status: DC | PRN

## 2023-11-11 MED ORDER — MIDAZOLAM HCL 2 MG/2ML IJ SOLN
INTRAMUSCULAR | Status: AC
  Filled 2023-11-11: qty 2

## 2023-11-11 MED ORDER — HEPARIN (PORCINE) IN NACL 1000-0.9 UT/500ML-% IV SOLN
INTRAVENOUS | Status: DC | PRN
  Administered 2023-11-11 (×2): 500 mL

## 2023-11-11 MED ORDER — HEPARIN (PORCINE) IN NACL 1000-0.9 UT/500ML-% IV SOLN
INTRAVENOUS | Status: AC
  Filled 2023-11-11: qty 1000

## 2023-11-11 MED ORDER — ASPIRIN 81 MG PO CHEW
81.0000 mg | CHEWABLE_TABLET | ORAL | Status: AC
  Administered 2023-11-11: 81 mg via ORAL

## 2023-11-11 MED ORDER — FENTANYL CITRATE (PF) 100 MCG/2ML IJ SOLN
INTRAMUSCULAR | Status: DC | PRN
  Administered 2023-11-11: 25 ug via INTRAVENOUS

## 2023-11-11 MED ORDER — LIDOCAINE HCL 1 % IJ SOLN
INTRAMUSCULAR | Status: AC
  Filled 2023-11-11: qty 20

## 2023-11-11 MED ORDER — ACETAMINOPHEN 325 MG PO TABS: 650.0000 mg | ORAL_TABLET | ORAL | Status: DC | PRN

## 2023-11-11 MED ORDER — SODIUM CHLORIDE 0.9 % IV SOLN
INTRAVENOUS | Status: DC | PRN
  Administered 2023-11-11: 250 mL via INTRAVENOUS

## 2023-11-11 MED ORDER — HEPARIN SODIUM (PORCINE) 1000 UNIT/ML IJ SOLN
INTRAMUSCULAR | Status: DC | PRN
  Administered 2023-11-11: 2500 [IU] via INTRAVENOUS

## 2023-11-11 SURGICAL SUPPLY — 10 items
CATH INFINITI 5 FR JL3.5 (CATHETERS) IMPLANT
CATH INFINITI JR4 5F (CATHETERS) IMPLANT
DEVICE RAD TR BAND REGULAR (VASCULAR PRODUCTS) IMPLANT
DRAPE BRACHIAL (DRAPES) IMPLANT
GLIDESHEATH SLEND SS 6F .021 (SHEATH) IMPLANT
GUIDEWIRE INQWIRE 1.5J.035X260 (WIRE) IMPLANT
KIT SYRINGE INJ CVI SPIKEX1 (MISCELLANEOUS) IMPLANT
PACK CARDIAC CATH (CUSTOM PROCEDURE TRAY) ×1 IMPLANT
SET ATX-X65L (MISCELLANEOUS) IMPLANT
STATION PROTECTION PRESSURIZED (MISCELLANEOUS) IMPLANT

## 2023-11-11 NOTE — Discharge Instructions (Signed)
 Radial Site Care Refer to this sheet in the next few weeks. These instructions provide you with information about caring for yourself after your procedure. Your health care provider may also give you more specific instructions. Your treatment has been planned according to current medical practices, but problems sometimes occur. Call your health care provider if you have any problems or questions after your procedure. What can I expect after the procedure? After your procedure, it is typical to have the following: Bruising at the radial site that usually fades within 1-2 weeks. Blood collecting in the tissue (hematoma) that may be painful to the touch. It should usually decrease in size and tenderness within 1-2 weeks.  Follow these instructions at home: Take medicines only as directed by your health care provider. If you are on a medication called Metformin please do not take for 48 hours after your procedure. Over the next 48hrs please increase your fluid intake of water and non caffeine beverages to flush the contrast dye out of your system.  You may shower 24 hours after the procedure  Leave your bandage on and gently wash the site with plain soap and water. Pat the area dry with a clean towel. Do not rub the site, because this may cause bleeding.  Remove your dressing 48hrs after your procedure and leave open to air.  Do not submerge your site in water for 7 days. This includes swimming and washing dishes.  Check your insertion site every day for redness, swelling, or drainage. Do not apply powder or lotion to the site. Do not flex or bend the affected arm for 24 hours or as directed by your health care provider. Do not push or pull heavy objects with the affected arm for 24 hours or as directed by your health care provider. Do not lift over 10 lb (4.5 kg) for 5 days after your procedure or as directed by your health care provider. Ask your health care provider when it is okay to: Return to  work or school. Resume usual physical activities or sports. Resume sexual activity. Do not drive home if you are discharged the same day as the procedure. Have someone else drive you. You may drive 48 hours after the procedure Do not operate machinery or power tools for 24 hours after the procedure. If your procedure was done as an outpatient procedure, which means that you went home the same day as your procedure, a responsible adult should be with you for the first 24 hours after you arrive home. Keep all follow-up visits as directed by your health care provider. This is important. Contact a health care provider if: You have a fever. You have chills. You have increased bleeding from the radial site. Hold pressure on the site. Get help right away if: You have unusual pain at the radial site. You have redness, warmth, or swelling at the radial site. You have drainage (other than a small amount of blood on the dressing) from the radial site. The radial site is bleeding, and the bleeding does not stop after 15 minutes of holding steady pressure on the site. Your arm or hand becomes pale, cool, tingly, or numb. This information is not intended to replace advice given to you by your health care provider. Make sure you discuss any questions you have with your health care provider. Document Released: 05/01/2010 Document Revised: 09/04/2015 Document Reviewed: 10/15/2013 Elsevier Interactive Patient Education  2018 [employer].

## 2023-11-15 DIAGNOSIS — I10 Essential (primary) hypertension: Secondary | ICD-10-CM | POA: Diagnosis not present

## 2023-11-15 DIAGNOSIS — I2510 Atherosclerotic heart disease of native coronary artery without angina pectoris: Secondary | ICD-10-CM | POA: Diagnosis not present

## 2023-11-15 DIAGNOSIS — Z955 Presence of coronary angioplasty implant and graft: Secondary | ICD-10-CM | POA: Diagnosis not present

## 2023-11-15 DIAGNOSIS — M349 Systemic sclerosis, unspecified: Secondary | ICD-10-CM | POA: Diagnosis not present

## 2023-11-15 DIAGNOSIS — I252 Old myocardial infarction: Secondary | ICD-10-CM | POA: Diagnosis not present

## 2023-11-15 DIAGNOSIS — R079 Chest pain, unspecified: Secondary | ICD-10-CM | POA: Diagnosis not present

## 2023-11-15 DIAGNOSIS — E7800 Pure hypercholesterolemia, unspecified: Secondary | ICD-10-CM | POA: Diagnosis not present

## 2023-11-15 DIAGNOSIS — K219 Gastro-esophageal reflux disease without esophagitis: Secondary | ICD-10-CM | POA: Diagnosis not present

## 2023-11-15 DIAGNOSIS — E782 Mixed hyperlipidemia: Secondary | ICD-10-CM | POA: Diagnosis not present

## 2023-11-16 DIAGNOSIS — I200 Unstable angina: Secondary | ICD-10-CM

## 2023-11-17 ENCOUNTER — Encounter: Payer: Self-pay | Admitting: Internal Medicine

## 2023-11-17 LAB — CARDIAC CATHETERIZATION: Cath EF Quantitative: 55 %

## 2024-01-19 ENCOUNTER — Telehealth: Payer: Self-pay

## 2024-01-19 DIAGNOSIS — K3184 Gastroparesis: Secondary | ICD-10-CM

## 2024-01-19 DIAGNOSIS — M349 Systemic sclerosis, unspecified: Secondary | ICD-10-CM

## 2024-01-19 DIAGNOSIS — Z8719 Personal history of other diseases of the digestive system: Secondary | ICD-10-CM

## 2024-01-19 DIAGNOSIS — K219 Gastro-esophageal reflux disease without esophagitis: Secondary | ICD-10-CM

## 2024-01-19 NOTE — Telephone Encounter (Signed)
 Copied from CRM [id number]. Topic: Referral - Status >> Jan 19, 2024 10:57 AM Gustabo D wrote: Pt insurance doesn't cover [facility] and wants to know if she can go to a gastro doc at [facility]

## 2024-02-08 NOTE — Telephone Encounter (Signed)
 Pt advised that referral was sent to [facility] 01/24/24. She was also given contact information to schedule appointment

## 2024-02-08 NOTE — Telephone Encounter (Unsigned)
 Copied from CRM #[id number]. Topic: Referral - Status >> Feb 08, 2024 10:11 AM Sara Huynh wrote: Reason for CRM: Patient calling to check the status of referral to gastroenterology, patient stated that initially referral was sent to [facility] but as of 04/12/24 her ins will not cover [facility], patient is req that referral be sent to a provider with [facility], patient is req a call back with status of this req.

## 2024-03-12 ENCOUNTER — Telehealth: Payer: Self-pay

## 2024-03-12 NOTE — Telephone Encounter (Signed)
 See other message on referral from 01/19/2024

## 2024-03-12 NOTE — Telephone Encounter (Signed)
 Copied from CRM #[id number]. Topic: Referral - Request for Referral >> Mar 12, 2024  1:44 PM [name] wrote: Patient is calling to request a transfer of care to [facility]. [facility] is thinking that the patient is trying to getting a 2nd opinion appointment.

## 2024-03-12 NOTE — Telephone Encounter (Signed)
 Copied from CRM #[id number]. Topic: Referral - Request for Referral >> Mar 12, 2024  1:44 PM [name] wrote: Patient is calling to request a transfer of care to [facility]. [facility] is thinking that the patient is trying to getting a 2nd opinion appointment.       Called patient regarding the above message since patient spoke with Lauraine Rogue 02/08/24- NA. Will try later.

## 2024-03-13 ENCOUNTER — Other Ambulatory Visit: Payer: Self-pay

## 2024-03-13 NOTE — Telephone Encounter (Signed)
 Created in error

## 2024-03-16 NOTE — Addendum Note (Signed)
 Addended by: Akita Maxim E on: 03/16/2024 02:36 PM   Modules accepted: Orders

## 2024-04-18 ENCOUNTER — Other Ambulatory Visit: Payer: Self-pay | Admitting: Family Medicine

## 2024-04-18 DIAGNOSIS — F439 Reaction to severe stress, unspecified: Secondary | ICD-10-CM

## 2024-06-20 ENCOUNTER — Ambulatory Visit

## 2024-07-13 ENCOUNTER — Encounter: Admitting: Family Medicine

## 2024-07-24 ENCOUNTER — Ambulatory Visit
